# Patient Record
Sex: Female | Born: 1970 | Race: Black or African American | Hispanic: No | Marital: Married | Smoking: Former smoker
Health system: Southern US, Community
[De-identification: ages and names within clinical notes are randomized; demographics above are authoritative.]

## PROBLEM LIST (undated history)

## (undated) DIAGNOSIS — F419 Anxiety disorder, unspecified: Secondary | ICD-10-CM

## (undated) DIAGNOSIS — F32A Depression, unspecified: Secondary | ICD-10-CM

## (undated) DIAGNOSIS — Z803 Family history of malignant neoplasm of breast: Secondary | ICD-10-CM

## (undated) DIAGNOSIS — I639 Cerebral infarction, unspecified: Secondary | ICD-10-CM

## (undated) DIAGNOSIS — Z8042 Family history of malignant neoplasm of prostate: Secondary | ICD-10-CM

## (undated) DIAGNOSIS — Z973 Presence of spectacles and contact lenses: Secondary | ICD-10-CM

## (undated) DIAGNOSIS — Z9109 Other allergy status, other than to drugs and biological substances: Secondary | ICD-10-CM

## (undated) DIAGNOSIS — K649 Unspecified hemorrhoids: Secondary | ICD-10-CM

## (undated) DIAGNOSIS — Z800 Family history of malignant neoplasm of digestive organs: Secondary | ICD-10-CM

## (undated) DIAGNOSIS — T4145XA Adverse effect of unspecified anesthetic, initial encounter: Secondary | ICD-10-CM

## (undated) DIAGNOSIS — T8859XA Other complications of anesthesia, initial encounter: Secondary | ICD-10-CM

## (undated) DIAGNOSIS — M5430 Sciatica, unspecified side: Secondary | ICD-10-CM

## (undated) DIAGNOSIS — F329 Major depressive disorder, single episode, unspecified: Secondary | ICD-10-CM

## (undated) DIAGNOSIS — I38 Endocarditis, valve unspecified: Secondary | ICD-10-CM

## (undated) DIAGNOSIS — D649 Anemia, unspecified: Secondary | ICD-10-CM

## (undated) HISTORY — DX: Depression, unspecified: F32.A

## (undated) HISTORY — DX: Unspecified hemorrhoids: K64.9

## (undated) HISTORY — DX: Sciatica, unspecified side: M54.30

## (undated) HISTORY — DX: Anemia, unspecified: D64.9

## (undated) HISTORY — DX: Cerebral infarction, unspecified: I63.9

## (undated) HISTORY — DX: Family history of malignant neoplasm of prostate: Z80.42

## (undated) HISTORY — DX: Family history of malignant neoplasm of breast: Z80.3

## (undated) HISTORY — DX: Family history of malignant neoplasm of digestive organs: Z80.0

## (undated) HISTORY — DX: Major depressive disorder, single episode, unspecified: F32.9

## (undated) HISTORY — DX: Other allergy status, other than to drugs and biological substances: Z91.09

## (undated) HISTORY — DX: Anxiety disorder, unspecified: F41.9

---
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
## 2007-08-01 ENCOUNTER — Ambulatory Visit: Payer: Self-pay | Admitting: Obstetrics and Gynecology

## 2008-06-21 ENCOUNTER — Ambulatory Visit: Payer: Self-pay | Admitting: Internal Medicine

## 2009-07-17 ENCOUNTER — Ambulatory Visit: Payer: Self-pay | Admitting: Internal Medicine

## 2009-12-02 ENCOUNTER — Emergency Department: Payer: Self-pay | Admitting: Emergency Medicine

## 2010-11-13 ENCOUNTER — Ambulatory Visit: Payer: Self-pay | Admitting: Internal Medicine

## 2011-02-11 ENCOUNTER — Encounter: Payer: Self-pay | Admitting: Maternal and Fetal Medicine

## 2011-07-25 ENCOUNTER — Inpatient Hospital Stay: Payer: Self-pay

## 2011-07-25 LAB — CBC WITH DIFFERENTIAL/PLATELET
Basophil #: 0 10*3/uL (ref 0.0–0.1)
Basophil %: 0.2 %
Eosinophil #: 0.1 10*3/uL (ref 0.0–0.7)
Eosinophil %: 0.8 %
HCT: 31.5 % — ABNORMAL LOW (ref 35.0–47.0)
HGB: 10.5 g/dL — ABNORMAL LOW (ref 12.0–16.0)
Lymphocyte #: 1.3 10*3/uL (ref 1.0–3.6)
Lymphocyte %: 13 %
MCH: 29.5 pg (ref 26.0–34.0)
MCHC: 33.3 g/dL (ref 32.0–36.0)
MCV: 89 fL (ref 80–100)
Monocyte #: 0.9 x10 3/mm (ref 0.2–0.9)
Monocyte %: 8.5 %
Neutrophil #: 7.8 10*3/uL — ABNORMAL HIGH (ref 1.4–6.5)
Neutrophil %: 77.5 %
Platelet: 185 10*3/uL (ref 150–440)
RBC: 3.55 10*6/uL — ABNORMAL LOW (ref 3.80–5.20)
RDW: 13.2 % (ref 11.5–14.5)
WBC: 10.1 10*3/uL (ref 3.6–11.0)

## 2011-07-26 LAB — HEMATOCRIT: HCT: 31.2 % — ABNORMAL LOW (ref 35.0–47.0)

## 2012-09-21 ENCOUNTER — Observation Stay: Payer: Self-pay | Admitting: Obstetrics & Gynecology

## 2012-10-02 ENCOUNTER — Observation Stay: Payer: Self-pay

## 2012-10-07 ENCOUNTER — Observation Stay: Payer: Self-pay | Admitting: Obstetrics & Gynecology

## 2012-10-07 LAB — BASIC METABOLIC PANEL
Anion Gap: 9 (ref 7–16)
BUN: 8 mg/dL (ref 7–18)
Calcium, Total: 9.2 mg/dL (ref 8.5–10.1)
Chloride: 108 mmol/L — ABNORMAL HIGH (ref 98–107)
Co2: 21 mmol/L (ref 21–32)
Creatinine: 0.54 mg/dL — ABNORMAL LOW (ref 0.60–1.30)
EGFR (African American): >60
EGFR (Non-African Amer.): >60
Glucose: 98 mg/dL (ref 65–99)
Osmolality: 274 (ref 275–301)
Potassium: 3.6 mmol/L (ref 3.5–5.1)
Sodium: 138 mmol/L (ref 136–145)

## 2012-10-07 LAB — CREATININE, URINE, RANDOM: Creatinine, Urine Random: 114 mg/dL (ref 30.0–125.0)

## 2012-10-07 LAB — SGOT (AST)(ARMC): SGOT(AST): 21 U/L (ref 15–37)

## 2012-10-07 LAB — URIC ACID: Uric Acid: 3.1 mg/dL (ref 2.6–6.0)

## 2012-10-07 LAB — HEMATOCRIT: HCT: 28.3 % — ABNORMAL LOW (ref 35.0–47.0)

## 2012-10-07 LAB — PROTEIN, URINE, RANDOM: Protein, Random Urine: 20 mg/dL — ABNORMAL HIGH (ref 0–12)

## 2012-10-07 LAB — PLATELET COUNT: Platelet: 175 10*3/uL (ref 150–440)

## 2012-10-07 LAB — WBC: WBC: 8.2 10*3/uL (ref 3.6–11.0)

## 2012-10-07 LAB — HEMOGLOBIN: HGB: 9.3 g/dL — ABNORMAL LOW (ref 12.0–16.0)

## 2012-10-07 LAB — RBC: RBC: 3.42 10*6/uL — ABNORMAL LOW (ref 3.80–5.20)

## 2012-10-08 ENCOUNTER — Inpatient Hospital Stay: Payer: Self-pay

## 2012-10-08 LAB — CBC WITH DIFFERENTIAL/PLATELET
Basophil #: 0 10*3/uL (ref 0.0–0.1)
Basophil %: 0.2 %
Eosinophil #: 0 10*3/uL (ref 0.0–0.7)
Eosinophil %: 0.4 %
HCT: 32 % — ABNORMAL LOW (ref 35.0–47.0)
HGB: 10.4 g/dL — ABNORMAL LOW (ref 12.0–16.0)
Lymphocyte #: 1.3 10*3/uL (ref 1.0–3.6)
Lymphocyte %: 11.7 %
MCH: 26.9 pg (ref 26.0–34.0)
MCHC: 32.6 g/dL (ref 32.0–36.0)
MCV: 83 fL (ref 80–100)
Monocyte #: 0.6 x10 3/mm (ref 0.2–0.9)
Monocyte %: 5.2 %
Neutrophil #: 9.4 10*3/uL — ABNORMAL HIGH (ref 1.4–6.5)
Neutrophil %: 82.5 %
Platelet: 195 10*3/uL (ref 150–440)
RBC: 3.87 10*6/uL (ref 3.80–5.20)
RDW: 15.5 % — ABNORMAL HIGH (ref 11.5–14.5)
WBC: 11.4 10*3/uL — ABNORMAL HIGH (ref 3.6–11.0)

## 2012-10-09 LAB — HEMATOCRIT: HCT: 26.7 % — ABNORMAL LOW (ref 35.0–47.0)

## 2013-12-11 LAB — HM PAP SMEAR: HM Pap smear: NORMAL

## 2014-05-27 ENCOUNTER — Emergency Department: Payer: 59

## 2014-05-27 ENCOUNTER — Encounter: Payer: Self-pay | Admitting: Emergency Medicine

## 2014-05-27 ENCOUNTER — Emergency Department
Admission: EM | Admit: 2014-05-27 | Discharge: 2014-05-27 | Disposition: A | Discharge status: Home / Self Care | Payer: 59 | Attending: Emergency Medicine | Admitting: Emergency Medicine

## 2014-05-27 ENCOUNTER — Other Ambulatory Visit: Payer: Self-pay

## 2014-05-27 DIAGNOSIS — Z880 Allergy status to penicillin: Secondary | ICD-10-CM | POA: Insufficient documentation

## 2014-05-27 DIAGNOSIS — R079 Chest pain, unspecified: Secondary | ICD-10-CM | POA: Diagnosis present

## 2014-05-27 DIAGNOSIS — Z91040 Latex allergy status: Secondary | ICD-10-CM | POA: Diagnosis not present

## 2014-05-27 DIAGNOSIS — R0789 Other chest pain: Secondary | ICD-10-CM | POA: Insufficient documentation

## 2014-05-27 LAB — BASIC METABOLIC PANEL
Anion gap: 5 (ref 5–15)
BUN: 15 mg/dL (ref 6–20)
CO2: 26 mmol/L (ref 22–32)
Calcium: 9.3 mg/dL (ref 8.9–10.3)
Chloride: 112 mmol/L — ABNORMAL HIGH (ref 101–111)
Creatinine, Ser: 0.7 mg/dL (ref 0.44–1.00)
GFR calc Af Amer: >60 mL/min (ref >60)
GFR calc non Af Amer: >60 mL/min (ref >60)
Glucose, Bld: 97 mg/dL (ref 65–99)
Potassium: 3.7 mmol/L (ref 3.5–5.1)
Sodium: 143 mmol/L (ref 135–145)

## 2014-05-27 LAB — CBC
HCT: 38.1 % (ref 35.0–47.0)
Hemoglobin: 12 g/dL (ref 12.0–16.0)
MCH: 27.7 pg (ref 26.0–34.0)
MCHC: 31.5 g/dL — ABNORMAL LOW (ref 32.0–36.0)
MCV: 87.8 fL (ref 80.0–100.0)
Platelets: 218 10*3/uL (ref 150–440)
RBC: 4.34 MIL/uL (ref 3.80–5.20)
RDW: 14.1 % (ref 11.5–14.5)
WBC: 4.1 10*3/uL (ref 3.6–11.0)

## 2014-05-27 LAB — TROPONIN I: Troponin I: <0.03 ng/mL (ref <0.031)

## 2014-05-27 MED ORDER — DIAZEPAM 5 MG PO TABS: 5.0000 mg | ORAL_TABLET | Freq: Three times a day (TID) | ORAL | Status: DC | PRN

## 2014-05-27 NOTE — ED Notes (Signed)
Pt reports chest pain for the past three days with tightness on and off. Pt denies any chest pain at present time.

## 2014-05-27 NOTE — ED Notes (Signed)
Pt in no distress upon assessment, pt speaking in full sentences with no labored breathing Brittney Duran, Brittney Alarlivia S, RN

## 2014-05-27 NOTE — Discharge Instructions (Signed)
Chest Pain (Nonspecific) °It is often hard to give a diagnosis for the cause of chest pain. There is always a chance that your pain could be related to something serious, such as a heart attack or a blood clot in the lungs. You need to follow up with your doctor. °HOME CARE °· If antibiotic medicine was given, take it as directed by your doctor. Finish the medicine even if you start to feel better. °· For the next few days, avoid activities that bring on chest pain. Continue physical activities as told by your doctor. °· Do not use any tobacco products. This includes cigarettes, chewing tobacco, and e-cigarettes. °· Avoid drinking alcohol. °· Only take medicine as told by your doctor. °· Follow your doctor's suggestions for more testing if your chest pain does not go away. °· Keep all doctor visits you made. °GET HELP IF: °· Your chest pain does not go away, even after treatment. °· You have a rash with blisters on your chest. °· You have a fever. °GET HELP RIGHT AWAY IF:  °· You have more pain or pain that spreads to your arm, neck, jaw, back, or belly (abdomen). °· You have shortness of breath. °· You cough more than usual or cough up blood. °· You have very bad back or belly pain. °· You feel sick to your stomach (nauseous) or throw up (vomit). °· You have very bad weakness. °· You pass out (faint). °· You have chills. °This is an emergency. Do not wait to see if the problems will go away. Call your local emergency services (911 in [place] Do not drive yourself to the hospital. °MAKE SURE YOU:  °· Understand these instructions. °· Will watch your condition. °· Will get help right away if you are not doing well or get worse. °Document Released: 06/23/2007 Document Revised: 01/09/2013 Document Reviewed: 06/23/2007 °ExitCare® Patient Information ©2015 [employer]. This information is not intended to replace advice given to you by your health care provider. Make sure you discuss any questions you have with your  health care provider. ° °Panic Attacks °Panic attacks are sudden, short-lived surges of severe anxiety, fear, or discomfort. They may occur for no reason when you are relaxed, when you are anxious, or when you are sleeping. Panic attacks may occur for a number of reasons:  °· Healthy people occasionally have panic attacks in extreme, life-threatening situations, such as war or natural disasters. Normal anxiety is a protective mechanism of the body that helps us react to danger (fight or flight response). °· Panic attacks are often seen with anxiety disorders, such as panic disorder, social anxiety disorder, generalized anxiety disorder, and phobias. Anxiety disorders cause excessive or uncontrollable anxiety. They may interfere with your relationships or other life activities. °· Panic attacks are sometimes seen with other mental illnesses, such as depression and posttraumatic stress disorder. °· Certain medical conditions, prescription medicines, and drugs of abuse can cause panic attacks. °SYMPTOMS  °Panic attacks start suddenly, peak within 20 minutes, and are accompanied by four or more of the following symptoms: °· Pounding heart or fast heart rate (palpitations). °· Sweating. °· Trembling or shaking. °· Shortness of breath or feeling smothered. °· Feeling choked. °· Chest pain or discomfort. °· Nausea or strange feeling in your stomach. °· Dizziness, light-headedness, or feeling like you will faint. °· Chills or hot flushes. °· Numbness or tingling in your lips or hands and feet. °· Feeling that things are not real or feeling that you are not yourself. °·   Fear of losing control or going crazy.  Fear of dying. Some of these symptoms can mimic serious medical conditions. For example, you may think you are having a heart attack. Although panic attacks can be very scary, they are not life threatening. DIAGNOSIS  Panic attacks are diagnosed through an assessment by your health care provider. Your health care  provider will ask questions about your symptoms, such as where and when they occurred. Your health care provider will also ask about your medical history and use of alcohol and drugs, including prescription medicines. Your health care provider may order blood tests or other studies to rule out a serious medical condition. Your health care provider may refer you to a mental health professional for further evaluation. TREATMENT   Most healthy people who have one or two panic attacks in an extreme, life-threatening situation will not require treatment.  The treatment for panic attacks associated with anxiety disorders or other mental illness typically involves counseling with a mental health professional, medicine, or a combination of both. Your health care provider will help determine what treatment is best for you.  Panic attacks due to physical illness usually go away with treatment of the illness. If prescription medicine is causing panic attacks, talk with your health care provider about stopping the medicine, decreasing the dose, or substituting another medicine.  Panic attacks due to alcohol or drug abuse go away with abstinence. Some adults need professional help in order to stop drinking or using drugs. HOME CARE INSTRUCTIONS   Take all medicines as directed by your health care provider.   Schedule and attend follow-up visits as directed by your health care provider. It is important to keep all your appointments. SEEK MEDICAL CARE IF:  You are not able to take your medicines as prescribed.  Your symptoms do not improve or get worse. SEEK IMMEDIATE MEDICAL CARE IF:   You experience panic attack symptoms that are different than your usual symptoms.  You have serious thoughts about hurting yourself or others.  You are taking medicine for panic attacks and have a serious side effect. MAKE SURE YOU:  Understand these instructions.  Will watch your condition.  Will get help right  away if you are not doing well or get worse. Document Released: 01/04/2005 Document Revised: 01/09/2013 Document Reviewed: 08/18/2012 [facility] Patient Information 2015 [place], [place]. This information is not intended to replace advice given to you by your health care provider. Make sure you discuss any questions you have with your health care provider.

## 2014-05-27 NOTE — ED Provider Notes (Signed)
[facility] Emergency Department Provider Note    Time seen: 9:37 AM  I have reviewed the triage vital signs and the nursing notes.   HISTORY  Chief Complaint Chest Pain    HPI Nehemiah MassedKiawana D Sigel is a 44 y.o. female who presents ER for tightness in her chest has been intermittent since Friday. Currently is mild nothing she has found makes it better or worse no other associated symptoms. She denies any recent illness. Denies history of same. Location pain is midsternal.    History reviewed. No pertinent past medical history.  There are no active problems to display for this patient.   Past Surgical History  Procedure Laterality Date  . Abdominal surgery      No current outpatient prescriptions on file.  Allergies Penicillins and Latex  No family history on file.  Social History History  Substance Use Topics  . Smoking status: Never Smoker   . Smokeless tobacco: Not on file  . Alcohol Use: Yes    Review of Systems Constitutional: Negative for fever. Eyes: Negative for visual changes. ENT: Negative for sore throat. Cardiovascular: Positive for chest pain. Respiratory: Negative for shortness of breath. Gastrointestinal: Negative for abdominal pain, vomiting and diarrhea. Genitourinary: Negative for dysuria. Musculoskeletal: Negative for back pain. Skin: Negative for rash. Neurological: Negative for headaches, focal weakness or numbness.  10-point ROS otherwise negative.  ____________________________________________   PHYSICAL EXAM:  VITAL SIGNS: ED Triage Vitals  Enc Vitals Group     BP 05/27/14 0921 115/58 mmHg     Pulse --      Resp 05/27/14 0921 20     Temp 05/27/14 0921 97.5 F (36.4 C)     Temp Source 05/27/14 0921 Oral     SpO2 05/27/14 0921 100 %     Weight 05/27/14 0921 200 lb (90.719 kg)     Height 05/27/14 0921 5\' 6"  (1.676 m)     Head Cir --      Peak Flow --      Pain Score 05/27/14 0922 0     Pain Loc --    Pain Edu? --      Excl. in GC? --     Constitutional: Alert and oriented. Well appearing and in no distress. Eyes: Conjunctivae are normal. PERRL. Normal extraocular movements. ENT   Head: Normocephalic and atraumatic.   Nose: No congestion/rhinnorhea.   Mouth/Throat: Mucous membranes are moist.   Neck: No stridor. Hematological/Lymphatic/Immunilogical: No cervical lymphadenopathy. Cardiovascular: Normal rate, regular rhythm. Normal and symmetric distal pulses are present in all extremities. No murmurs, rubs, or gallops. Respiratory: Normal respiratory effort without tachypnea nor retractions. Breath sounds are clear and equal bilaterally. No wheezes/rales/rhonchi. Gastrointestinal: Soft and nontender. No distention. No abdominal bruits. There is no CVA tenderness. Musculoskeletal: Nontender with normal range of motion in all extremities. No joint effusions.  No lower extremity tenderness nor edema. Neurologic:  Normal speech and language. No gross focal neurologic deficits are appreciated. Speech is normal. No gait instability. Skin:  Skin is warm, dry and intact. No rash noted. Psychiatric: Mood and affect are normal. Speech and behavior are normal. Patient exhibits appropriate insight and judgment.  ____________________________________________    LABS (pertinent positives/negatives)  Normal labs, negative troponin  EKG: Normal sinus rhythm rate 66 normal EKG ____________________________________________    RADIOLOGY  Normal chest x-ray  ____________________________________________    ED COURSE  Pertinent labs & imaging results that were available during my care of the patient were reviewed by me  and considered in my medical decision making (see chart for details).  Tight midsternal chest pain. Etiology unclear. Patient is low risk for ACS, will check basic labs EKG and reevaluate.  FINAL ASSESSMENT AND PLAN  Assessment: Chest pain  Plan: Pain is likely  anxiety related. DC with Valium when necessary. Close follow-up with her primary care doctors recommended.    Emily FilbertWilliams, Jonathan E, MD   Emily FilbertJonathan E Williams, MD 05/27/14 [phone]

## 2014-05-27 NOTE — ED Notes (Signed)
Pt states she has had chest pain on and off for 1 week, pt states she called her PCP and was told to come to the ER, pt denies any cardiac hx Manuel Lawhead, Maryann Alarlivia S, RN

## 2014-05-28 NOTE — H&P (Signed)
L&D Evaluation:  History Expanded:  HPI 44 yo G6 P4014 with EDC=10/09/2012 by an 11 week ultrasound presents at 39weeks 6 days with mildly elevated BPs. Denies h/a, blurry vision, epig pain, CP, SOB, or edema.  Baby active. Has had more painful and regular contractions. No VB. PNC has been remarkable for a prior C-section in 1989 for FTP and severe preeclampsia with three subsequent VBACs.  PNC also remarkable for AMA (Harmony test negative), the fetus having a SUA and echogenic focus, anemia, and GBS positive. Has been having weekly NSTs and AFIs since 32 weeks and an ultrasound at 33 weeks revealing normal growth with EFW in the 31.7th%. LABS:O POS, RI, VI   Gravida 6   Term 4   PreTerm 0   Abortion 1   Living 4   Blood Type (Maternal) O positive   Group B Strep Results Maternal (Result >5wks must be treated as unknown) positive   Maternal HIV Negative   Maternal Syphilis Ab Nonreactive   Maternal Varicella Immune   Rubella Results (Maternal) immune   Maternal T-Dap Unknown   [facility]   Presents with contractions   Patient's Medical History No Chronic Illness  Hypertension  obesity   Patient's Surgical History Previous C-Section  hernia repair   Medications Pre Natal Vitamins   Allergies PCN, latex   Social History none   Family History Non-Contributory   ROS:  ROS see HPI   Exam:  Vital Signs Initial- 130-150/80s, now- 120-130/70-80s.   Urine Protein trace   General no apparent distress   Mental Status clear   Chest clear   Heart normal sinus rhythm   Abdomen gravid, non-tender   Estimated Fetal Weight Average for gestational age   Fetal Position cephalic   Back no CVAT   Edema no edema   Pelvic no external lesions, 6cm   Mebranes Intact   FHT normal rate with no decels, 140 with accels   FHT Description great variability   Fetal Heart Rate 145   Ucx regular, q 2-4 min   Skin dry   Lymph no lymphadenopathy    Impression:  Impression active labor, IUP at 39 weeks   Plan:  Plan EFM/NST, antibiotics for GBBS prophylaxis   Comments anticipate SVD,  With rest BPs improved, and labs normal, no other symptoms. F/U in office or Labor and Delivery sooner if change in symptoms or certainly in labor.   Follow Up Appointment need to schedule. in 6 weeks   Electronic Signatures: Adria DevonKlett, Leiam Hopwood (MD)  (Signed 21-Sep-14 11:28)  Authored: L&D Evaluation   Last Updated: 21-Sep-14 11:28 by Adria DevonKlett, Otis Portal (MD)

## 2014-05-28 NOTE — H&P (Signed)
L&D Evaluation:  History Expanded:   HPI 44 yo at 40weeks 6 days, Pt is AMA and a multip. last baby was 10 years ago. she had csection for 10 baby and two VBACs after. desires vbac and no epidural. admit for delivery at 4 cm. with bloody show, pt needs tDap , she is RI, VI, GBS negand had elevated rusk for DS on AFP test.    Gravida 5    Term 3    PreTerm 0    Abortion 1    Living 3    Blood Type O positive    Group B Strep Results (Result >5wks must be treated as unknown) negative    Maternal HIV Negative    Maternal Syphilis Ab Nonreactive    Maternal Varicella Immune    Rubella Results immune    Maternal T-Dap Nonimmune    [facility]    Presents with contractions    Patient's Medical History Hypertension  obesity    Patient's Surgical History Previous C-Section    Medications Pre Natal Vitamins    Allergies PCN, latex    Social History none    Family History Non-Contributory   ROS:   ROS All systems were reviewed.  HEENT, CNS, GI, GU, Respiratory, CV, Renal and Musculoskeletal systems were found to be normal.   Exam:   Vital Signs stable    Urine Protein not completed    General no apparent distress    Mental Status clear    Chest clear    Heart normal sinus rhythm    Abdomen gravid, tender with contractions    Fetal Position vertex    Pelvic no external lesions, 4    Mebranes Intact    FHT normal rate with no decels    FHT Description strictly reactive no decels,    Fetal Heart Rate 140    Ucx irregular    Skin dry   Impression:   Impression active labor   Plan:   Plan monitor contractions and for cervical change    Comments admit for delivery VBAC concent signed and she is a great candidate for Vbac    Follow Up Appointment in 6 weeks   Electronic Signatures: Adria DevonKlett, Evany Schecter (MD)  (Signed 07-Jul-13 09:31)  Authored: L&D Evaluation   Last Updated: 07-Jul-13 09:31 by Adria DevonKlett, Laynee Lockamy (MD)

## 2014-05-28 NOTE — H&P (Signed)
L&D Evaluation:  History Expanded:  HPI 44 yo G6 P4014 with EDC=10/09/2012 by an 11 week ultrasound presents at 39+ weeks with elevated BPs. Denies h/a, blurry vision, epig pain, CP, SOB, or edema.  Baby active. Has had more painful, although not regular contractions. No VB. PNC has been remarkable for a prior C-section in 1989 for FTP and severe preeclampsia with three subsequent VBACs.  PNC also remarkable for AMA (Harmony test negative), the fetus having a SUA and echogenic focus, anemia, and GBS positive. Has been having weekly NSTs and AFIs since 32 weeks and an ultrasound at 33 weeks revealing normal growth with EFW in the 31.7th%. LABS:O POS, RI, VI   Patient's Medical History Hypertension  obesity   Patient's Surgical History Previous C-Section  hernia repair   Medications Pre Natal Vitamins   Allergies PCN, latex   Social History none   Family History Non-Contributory   ROS:  ROS see HPI   Exam:  Vital Signs Initial- 130-150/80s, now- 120-130/70-80s.   Urine Protein trace   General no apparent distress   Mental Status clear   Abdomen gravid, non-tender   Estimated Fetal Weight Average for gestational age   Fetal Position cephalic   Back no CVAT   Edema no edema   FHT normal rate with no decels, 140 with accels   FHT Description mod variability   Fetal Heart Rate 145   Ucx irregular, q15+ min apart   Skin dry   Other See labs   Impression:  Impression IUP at 39 weeks no signs or symptoms of preclampia; gestation HTN   Plan:  Plan EFM/NST, PIH panel   Comments With rest BPs improved, and labs normal, no other symptoms. F/U in office or Labor and Delivery sooner if change in symptoms or certainly in labor.   Electronic Signatures: Letitia LibraHarris, Robert Paul (MD)  (Signed 20-Sep-14 07:00)  Authored: L&D Evaluation   Last Updated: 20-Sep-14 07:00 by Letitia LibraHarris, Robert Paul (MD)

## 2014-05-28 NOTE — H&P (Signed)
L&D Evaluation:  History Expanded:  HPI 44 yo at term, pregnancy w SUA, for monitoring. AFI recently 12.  Non-Stress Test here.   Gravida 6   Term 4   PreTerm 0   Abortion 1   Living 4   Blood Type (Maternal) O positive   Maternal HIV Negative   Maternal Syphilis Ab Nonreactive   Maternal Varicella Immune   Rubella Results (Maternal) immune   Maternal T-Dap Nonimmune   [facility]   Patient's Medical History Hypertension  obesity   Patient's Surgical History Previous C-Section   Medications Pre Natal Vitamins   Allergies PCN, latex   Social History none   Family History Non-Contributory   ROS:  ROS All systems were reviewed.  HEENT, CNS, GI, GU, Respiratory, CV, Renal and Musculoskeletal systems were found to be normal.   Exam:  Vital Signs stable   Urine Protein not completed   General no apparent distress   Mental Status clear   Abdomen gravid, non-tender   Estimated Fetal Weight Average for gestational age   Fetal Position vertex   Edema no edema   FHT normal rate with no decels, REACTIVE   Fetal Heart Rate 140   Impression:  Impression reactive NST   Plan:  Plan EFM/NST   Follow Up Appointment already scheduled. in 6 weeks   Electronic Signatures: Letitia LibraHarris, Nizar Cutler Paul (MD)  (Signed 04-Sep-14 17:59)  Authored: L&D Evaluation   Last Updated: 04-Sep-14 17:59 by Letitia LibraHarris, Terrie Haring Paul (MD)

## 2014-05-28 NOTE — H&P (Signed)
L&D Evaluation:  History:  HPI 44 yo G6 P4014 with EDC=10/09/2012 by an 11 week ultrasound presented at 39 weeks with c/o leakage of fluid at 1145 this AM after standing up from urinating. The fluid filled a miniliner, and ran into her shoe.. She has not leaked any fluid since. The fluid did smell and look like urine but she did not feel the fluid come from her urethra and could not stip it. Baby active. Has had more painful, although not regular contractions. No VB. PNC has been remarkable for a prior C-section in 1989 for FTP and severe preeclampsia with three subsequent VBACs.  PNC also remarkable for AMA (Harmony test negative), the fetus having a SUA and echogenic focus, anemia, and GBS positive. Has been having weekly NSTs and AFIs since 32 weeks and an ultrasound at 33 weeks revealing normal growth with EFW in the 31.7th%. LABS:O POS, RI, VI   Presents with leaking fluid   Patient's Medical History Hypertension  obesity   Patient's Surgical History Previous C-Section  hernia repair   Medications Pre Natal Vitamins   Allergies PCN, latex   Social History none   Family History Non-Contributory   ROS:  ROS see HPI   Exam:  Vital Signs 137/75   Urine Protein not completed   General no apparent distress   Mental Status clear   Abdomen gravid, non-tender   Estimated Fetal Weight Average for gestational age   Fetal Position cephalic   Edema no edema   Pelvic no external lesions, SSE: mucoid white discharge. wet prep negative. Nitrazine neg, ferning negative.   Mebranes Intact   FHT normal rate with no decels, 145 with accels to 160s to 170s   FHT Description mod variability   Fetal Heart Rate 145   Ucx irregular, q15+ min apart   Skin dry   Impression:  Impression IUP at 39 weeks with no evidence of SROM. Reactive NST   Plan:  Plan DC home with labor precautions. RTO Monday as scheduled or sooner prn.   Electronic Signatures: Trinna BalloonGutierrez, Dontell Mian L (CNM)   (Signed 15-Sep-14 14:42)  Authored: L&D Evaluation   Last Updated: 15-Sep-14 14:42 by Trinna BalloonGutierrez, Tyreke Kaeser L (CNM)

## 2014-10-01 ENCOUNTER — Other Ambulatory Visit: Payer: Self-pay

## 2014-10-01 DIAGNOSIS — F419 Anxiety disorder, unspecified: Secondary | ICD-10-CM

## 2014-10-01 DIAGNOSIS — E559 Vitamin D deficiency, unspecified: Secondary | ICD-10-CM | POA: Insufficient documentation

## 2014-10-02 ENCOUNTER — Ambulatory Visit (INDEPENDENT_AMBULATORY_CARE_PROVIDER_SITE_OTHER): Payer: 59 | Admitting: Gastroenterology

## 2014-10-02 ENCOUNTER — Encounter (INDEPENDENT_AMBULATORY_CARE_PROVIDER_SITE_OTHER): Payer: Self-pay

## 2014-10-02 ENCOUNTER — Encounter: Payer: Self-pay | Admitting: Gastroenterology

## 2014-10-02 ENCOUNTER — Other Ambulatory Visit: Payer: Self-pay

## 2014-10-02 VITALS — BP 119/61 | HR 79 | Temp 98.4°F | Ht 67.0 in | Wt 205.0 lb

## 2014-10-02 DIAGNOSIS — R194 Change in bowel habit: Secondary | ICD-10-CM | POA: Diagnosis not present

## 2014-10-02 DIAGNOSIS — K921 Melena: Secondary | ICD-10-CM

## 2014-10-02 NOTE — Progress Notes (Signed)
Gastroenterology Consultation  Referring Provider:     Lyndon Code, MD Primary Care Physician:  Lyndon Code, MD Primary Gastroenterologist:  Dr. Servando Snare     Reason for Consultation:     Hematochezia and change in bowel habits        HPI:   Brittney Duran is a 44 y.o. y/o female referred for consultation & management of the hematochezia and change in bowel habits by Dr. Welton Flakes, Shannan Harper, MD.  This patient comes here today after having an episode last month of rectal bleeding. The patient states that there was dark blood and also red blood that lasted approximate 4 days. She also reports that she has been having a change in bowel habits with more constipation recently that she has the past. This has continued despite the rectal bleeding stopping. There is no report of any unexplained weight loss. The patient also denies any nausea vomiting fevers or chills. The patient denies any family history of colon cancer colon polyps. The patient now being sent to me for evaluation of rectal bleeding with change in bowel habits.  Past Medical History  Diagnosis Date  . Hemorrhoids   . Anxiety   . Anemia   . Environmental allergies     Past Surgical History  Procedure Laterality Date  . Abdominal surgery    . Caesaran section  1989    Prior to Admission medications   Medication Sig Start Date End Date Taking? Authorizing Provider  ALPRAZolam (XANAX) 0.25 MG tablet Take 0.25 mg by mouth 2 (two) times daily as needed for anxiety.   Yes Historical Provider, MD  cetirizine-pseudoephedrine (ZYRTEC-D) 5-120 MG per tablet Take 1 tablet by mouth 2 (two) times daily as needed for allergies.   Yes Historical Provider, MD  PARAGARD INTRAUTERINE COPPER IUD IUD 1 each by Intrauterine route once.   Yes Historical Provider, MD  vitamin B-12 (CYANOCOBALAMIN) 1000 MCG tablet Take 1,000 mcg by mouth daily as needed (energy).   Yes Historical Provider, MD  Vitamin D, Ergocalciferol, (DRISDOL) 50000 UNITS CAPS  capsule Take 50,000 Units by mouth every 7 (seven) days.   Yes Historical Provider, MD  diazepam (VALIUM) 5 MG tablet Take 1 tablet (5 mg total) by mouth every 8 (eight) hours as needed for anxiety. Patient not taking: Reported on 10/02/2014 05/27/14 05/27/15  Emily Filbert, MD    Family History  Problem Relation Age of Onset  . Hypertension Mother      Social History  Substance Use Topics  . Smoking status: Never Smoker   . Smokeless tobacco: Never Used  . Alcohol Use: Yes    Allergies as of 10/02/2014 - Review Complete 10/02/2014  Allergen Reaction Noted  . Penicillins Other (See Comments) 05/27/2014  . Latex Rash 05/27/2014    Review of Systems:    All systems reviewed and negative except where noted in HPI.   Physical Exam:  BP 119/61 mmHg  Pulse 79  Temp(Src) 98.4 F (36.9 C) (Oral)  Ht  (1.702 m)  Wt 205 lb (92.987 kg)  BMI 32.10 kg/m2 No LMP recorded. Psych:  Alert and cooperative. Normal mood and affect. General:   Alert,  Well-developed, well-nourished, pleasant and cooperative in NAD Head:  Normocephalic and atraumatic. Eyes:  Sclera clear, no icterus.   Conjunctiva pink. Ears:  Normal auditory acuity. Nose:  No deformity, discharge, or lesions. Mouth:  No deformity or lesions,oropharynx pink & moist. Neck:  Supple; no masses or thyromegaly. Lungs:  Respirations even and unlabored.  Clear throughout to auscultation.   No wheezes, crackles, or rhonchi. No acute distress. Heart:  Regular rate and rhythm; no murmurs, clicks, rubs, or gallops. Abdomen:  Normal bowel sounds.  No bruits.  Soft, non-tender and non-distended without masses, hepatosplenomegaly or hernias noted.  No guarding or rebound tenderness.  Negative Carnett sign.   Rectal:  Deferred.  Msk:  Symmetrical without gross deformities.  Good, equal movement & strength bilaterally. Pulses:  Normal pulses noted. Extremities:  No clubbing or edema.  No cyanosis. Neurologic:  Alert and oriented  x3;  grossly normal neurologically. Skin:  Intact without significant lesions or rashes.  No jaundice. Lymph Nodes:  No significant cervical adenopathy. Psych:  Alert and cooperative. Normal mood and affect.  Imaging Studies: No results found.  Assessment and Plan:   Brittney Duran is a 44 y.o. y/o female who comes in today with a history of 4 days of rectal bleeding and a change in bowel habits with increasing constipation over the last few months. The patient's rectal bleeding only lasted 4 days and was proximal to 1 month ago. The patient has been told that she should be set up for colonoscopy. The patient has agreed to being set up for colonoscopy.I have discussed risks & benefits which include, but are not limited to, bleeding, infection, perforation & drug reaction.  The patient agrees with this plan & written consent will be obtained.      Note: This dictation was prepared with Dragon dictation along with smaller phrase technology. Any transcriptional errors that result from this process are unintentional.

## 2014-10-03 ENCOUNTER — Encounter: Payer: Self-pay | Admitting: *Deleted

## 2014-10-04 NOTE — Discharge Instructions (Signed)

## 2014-10-07 ENCOUNTER — Encounter
Admission: RE | Disposition: A | Discharge status: Home / Self Care | Payer: Self-pay | Source: Ambulatory Visit | Attending: Gastroenterology

## 2014-10-07 ENCOUNTER — Ambulatory Visit: Payer: Managed Care, Other (non HMO) | Admitting: Anesthesiology

## 2014-10-07 ENCOUNTER — Ambulatory Visit
Admission: RE | Admit: 2014-10-07 | Discharge: 2014-10-07 | Disposition: A | Discharge status: Home / Self Care | Payer: Managed Care, Other (non HMO) | Source: Ambulatory Visit | Attending: Gastroenterology | Admitting: Gastroenterology

## 2014-10-07 DIAGNOSIS — Z880 Allergy status to penicillin: Secondary | ICD-10-CM | POA: Insufficient documentation

## 2014-10-07 DIAGNOSIS — D649 Anemia, unspecified: Secondary | ICD-10-CM | POA: Insufficient documentation

## 2014-10-07 DIAGNOSIS — F419 Anxiety disorder, unspecified: Secondary | ICD-10-CM | POA: Insufficient documentation

## 2014-10-07 DIAGNOSIS — K921 Melena: Secondary | ICD-10-CM | POA: Insufficient documentation

## 2014-10-07 DIAGNOSIS — Z8249 Family history of ischemic heart disease and other diseases of the circulatory system: Secondary | ICD-10-CM | POA: Diagnosis not present

## 2014-10-07 DIAGNOSIS — K640 First degree hemorrhoids: Secondary | ICD-10-CM | POA: Insufficient documentation

## 2014-10-07 DIAGNOSIS — Z91040 Latex allergy status: Secondary | ICD-10-CM | POA: Insufficient documentation

## 2014-10-07 DIAGNOSIS — Z79899 Other long term (current) drug therapy: Secondary | ICD-10-CM | POA: Insufficient documentation

## 2014-10-07 HISTORY — DX: Presence of spectacles and contact lenses: Z97.3

## 2014-10-07 HISTORY — DX: Other complications of anesthesia, initial encounter: T88.59XA

## 2014-10-07 HISTORY — DX: Adverse effect of unspecified anesthetic, initial encounter: T41.45XA

## 2014-10-07 HISTORY — DX: Endocarditis, valve unspecified: I38

## 2014-10-07 HISTORY — PX: COLONOSCOPY WITH PROPOFOL: SHX5780

## 2014-10-07 SURGERY — COLONOSCOPY WITH PROPOFOL
Anesthesia: Monitor Anesthesia Care | Wound class: Contaminated

## 2014-10-07 MED ORDER — SODIUM CHLORIDE 0.9 % IV SOLN: INTRAVENOUS | Status: DC

## 2014-10-07 MED ORDER — PROPOFOL 10 MG/ML IV BOLUS
INTRAVENOUS | Status: DC | PRN
  Administered 2014-10-07: 30 mg via INTRAVENOUS
  Administered 2014-10-07 (×2): 80 mg via INTRAVENOUS
  Administered 2014-10-07: 40 mg via INTRAVENOUS

## 2014-10-07 MED ORDER — LIDOCAINE HCL (CARDIAC) 20 MG/ML IV SOLN
INTRAVENOUS | Status: DC | PRN
  Administered 2014-10-07: 30 mg via INTRAVENOUS

## 2014-10-07 MED ORDER — STERILE WATER FOR IRRIGATION IR SOLN
Status: DC | PRN
  Administered 2014-10-07: 11:00:00

## 2014-10-07 MED ORDER — LACTATED RINGERS IV SOLN
INTRAVENOUS | Status: DC
  Administered 2014-10-07: 11:00:00 via INTRAVENOUS

## 2014-10-07 SURGICAL SUPPLY — 28 items
CANISTER SUCT 1200ML W/VALVE (MISCELLANEOUS) ×2 IMPLANT
FCP ESCP3.2XJMB 240X2.8X (MISCELLANEOUS)
FORCEPS BIOP RAD 4 LRG CAP 4 (CUTTING FORCEPS) IMPLANT
FORCEPS BIOP RJ4 240 W/NDL (MISCELLANEOUS)
FORCEPS ESCP3.2XJMB 240X2.8X (MISCELLANEOUS) IMPLANT
GOWN CVR UNV OPN BCK APRN NK (MISCELLANEOUS) ×2 IMPLANT
GOWN ISOL THUMB LOOP REG UNIV (MISCELLANEOUS) ×4
HEMOCLIP INSTINCT (CLIP) IMPLANT
INJECTOR VARIJECT VIN23 (MISCELLANEOUS) IMPLANT
KIT CO2 TUBING (TUBING) IMPLANT
KIT DEFENDO VALVE AND CONN (KITS) IMPLANT
KIT ENDO PROCEDURE OLY (KITS) ×2 IMPLANT
LIGATOR MULTIBAND 6SHOOTER MBL (MISCELLANEOUS) IMPLANT
MARKER SPOT ENDO TATTOO 5ML (MISCELLANEOUS) IMPLANT
PAD GROUND ADULT SPLIT (MISCELLANEOUS) IMPLANT
SNARE SHORT THROW 13M SML OVAL (MISCELLANEOUS) IMPLANT
SNARE SHORT THROW 30M LRG OVAL (MISCELLANEOUS) IMPLANT
SPOT EX ENDOSCOPIC TATTOO (MISCELLANEOUS)
SUCTION POLY TRAP 4CHAMBER (MISCELLANEOUS) IMPLANT
TRAP SUCTION POLY (MISCELLANEOUS) IMPLANT
TUBING CONN 6MMX3.1M (TUBING)
TUBING SUCTION CONN 0.25 STRL (TUBING) IMPLANT
UNDERPAD 30X60 958B10 (PK) (MISCELLANEOUS) IMPLANT
VALVE BIOPSY ENDO (VALVE) IMPLANT
VARIJECT INJECTOR VIN23 (MISCELLANEOUS)
WATER AUXILLARY (MISCELLANEOUS) IMPLANT
WATER STERILE IRR 250ML POUR (IV SOLUTION) ×2 IMPLANT
WATER STERILE IRR 500ML POUR (IV SOLUTION) IMPLANT

## 2014-10-07 NOTE — H&P (Signed)
[facility]  [address] [place], [place] [address] Phone: [phone] Fax : [phone]  Primary Care Physician:  Lyndon Code, MD Primary Gastroenterologist:  Dr. Servando Snare  Pre-Procedure History & Physical: HPI:  Brittney Duran is a 44 y.o. female is here for an colonoscopy.   Past Medical History  Diagnosis Date  . Hemorrhoids   . Anemia   . Environmental allergies   . Complication of anesthesia     pt reports "local" wears off quickly  . Leaky heart valve   . Anxiety     Panic attacks  . Wears contact lenses     Past Surgical History  Procedure Laterality Date  . Abdominal surgery    . Caesaran section  1989    Prior to Admission medications   Medication Sig Start Date End Date Taking? Authorizing Provider  cetirizine-pseudoephedrine (ZYRTEC-D) 5-120 MG per tablet Take 1 tablet by mouth 2 (two) times daily as needed for allergies.   Yes Historical Provider, MD  diphenhydrAMINE (BENADRYL) 25 MG tablet Take 25 mg by mouth every 6 (six) hours as needed.   Yes Historical Provider, MD  vitamin B-12 (CYANOCOBALAMIN) 1000 MCG tablet Take 1,000 mcg by mouth daily as needed (energy).   Yes Historical Provider, MD  Vitamin D, Ergocalciferol, (DRISDOL) 50000 UNITS CAPS capsule Take 50,000 Units by mouth every 7 (seven) days.   Yes Historical Provider, MD  ALPRAZolam (XANAX) 0.25 MG tablet Take 0.25 mg by mouth 2 (two) times daily as needed for anxiety.    Historical Provider, MD  diazepam (VALIUM) 5 MG tablet Take 1 tablet (5 mg total) by mouth every 8 (eight) hours as needed for anxiety. Patient not taking: Reported on 10/02/2014 05/27/14 05/27/15  Emily Filbert, MD  [facility] INTRAUTERINE COPPER IUD IUD 1 each by Intrauterine route once.    Historical Provider, MD    Allergies as of 10/02/2014 - Review Complete 10/02/2014  Allergen Reaction Noted  . Penicillins Other (See Comments) 05/27/2014  . Latex Rash 05/27/2014    Family History  Problem  Relation Age of Onset  . Hypertension Mother     Social History   Social History  . Marital Status: Married    Spouse Name: N/A  . Number of Children: N/A  . Years of Education: N/A   Occupational History  . Not on file.   Social History Main Topics  . Smoking status: Never Smoker   . Smokeless tobacco: Never Used  . Alcohol Use: No  . Drug Use: No  . Sexual Activity: Not on file   Other Topics Concern  . Not on file   Social History Narrative    Review of Systems: See HPI, otherwise negative ROS  Physical Exam: BP 116/68 mmHg  Pulse 65  Temp(Src) 97.7 F (36.5 C)  Resp 16  Ht  (1.702 m)  Wt 197 lb (89.359 kg)  BMI 30.85 kg/m2  SpO2 100%  LMP 10/01/2014 (Exact Date) General:   Alert,  pleasant and cooperative in NAD Head:  Normocephalic and atraumatic. Neck:  Supple; no masses or thyromegaly. Lungs:  Clear throughout to auscultation.    Heart:  Regular rate and rhythm. Abdomen:  Soft, nontender and nondistended. Normal bowel sounds, without guarding, and without rebound.   Neurologic:  Alert and  oriented x4;  grossly normal neurologically.  Impression/Plan: [name] is here for an colonoscopy to be performed for hematocezia  Risks, benefits, limitations, and alternatives regarding  colonoscopy have been  reviewed with the patient.  Questions have been answered.  All parties agreeable.   Darlina Rumpf, MD  10/07/2014, 10:43 AM

## 2014-10-07 NOTE — Anesthesia Preprocedure Evaluation (Signed)
Anesthesia Evaluation  Patient identified by MRN, date of birth, ID band  Reviewed: Allergy & Precautions, H&P , NPO status , Patient's Chart, lab work & pertinent test results  History of Anesthesia Complications Negative for: history of anesthetic complications  Airway Mallampati: I  TM Distance: >3 FB Neck ROM: full    Dental no notable dental hx.    Pulmonary    Pulmonary exam normal        Cardiovascular  Rhythm:regular Rate:Normal     Neuro/Psych    GI/Hepatic   Endo/Other    Renal/GU      Musculoskeletal   Abdominal   Peds  Hematology   Anesthesia Other Findings   Reproductive/Obstetrics                             Anesthesia Physical Anesthesia Plan  ASA: II  Anesthesia Plan: MAC   Post-op Pain Management:    Induction:   Airway Management Planned:   Additional Equipment:   Intra-op Plan:   Post-operative Plan:   Informed Consent: I have reviewed the patients History and Physical, chart, labs and discussed the procedure including the risks, benefits and alternatives for the proposed anesthesia with the patient or authorized representative who has indicated his/her understanding and acceptance.     Plan Discussed with: CRNA  Anesthesia Plan Comments:         Anesthesia Quick Evaluation

## 2014-10-07 NOTE — Anesthesia Postprocedure Evaluation (Signed)
  Anesthesia Post-op Note  Patient: Brittney Duran  Procedure(s) Performed: Procedure(s) with comments: COLONOSCOPY WITH PROPOFOL (N/A) - Latex  Anesthesia type:MAC  Patient location: PACU  Post pain: Pain level controlled  Post assessment: Post-op Vital signs reviewed, Patient's Cardiovascular Status Stable, Respiratory Function Stable, Patent Airway and No signs of Nausea or vomiting  Post vital signs: Reviewed and stable  Last Vitals:  Filed Vitals:   10/07/14 1121  BP: 113/68  Pulse:   Temp:   Resp:     Level of consciousness: awake, alert  and patient cooperative  Complications: No apparent anesthesia complications

## 2014-10-07 NOTE — Op Note (Signed)
[facility] Patient Name: Brittney Duran Procedure Date: 10/07/2014 10:44 AM MRN: [id number] Account #: [id number] Date of Birth: July 15, 1970 Admit Type: Outpatient Age: 44 Room: [facility] OR ROOM 01 Gender: Female Note Status: Finalized Procedure:         Colonoscopy Indications:       Hematochezia Providers:         [name], MD Referring MD:      Lyndon Code, MD (Referring MD) Medicines:         Propofol per Anesthesia Complications:     No immediate complications. Procedure:         Pre-Anesthesia Assessment:                    - Prior to the procedure, a History and Physical was                     performed, and patient medications and allergies were                     reviewed. The patient's tolerance of previous anesthesia                     was also reviewed. The risks and benefits of the procedure                     and the sedation options and risks were discussed with the                     patient. All questions were answered, and informed consent                     was obtained. Prior Anticoagulants: The patient has taken                     no previous anticoagulant or antiplatelet agents. ASA                     Grade Assessment: II - A patient with mild systemic                     disease. After reviewing the risks and benefits, the                     patient was deemed in satisfactory condition to undergo                     the procedure.                    After obtaining informed consent, the colonoscope was                     passed under direct vision. Throughout the procedure, the                     patient's blood pressure, pulse, and oxygen saturations                     were monitored continuously. The Olympus CF H180AL                     colonoscope (S#: [id number]) was introduced through the anus  and advanced to the the cecum, identified by appendiceal                     orifice and  ileocecal valve. The colonoscopy was performed                     without difficulty. The patient tolerated the procedure                     well. The quality of the bowel preparation was fair. Findings:      The perianal and digital rectal examinations were normal.      Non-bleeding internal hemorrhoids were found during retroflexion. The       hemorrhoids were Grade I (internal hemorrhoids that do not prolapse). Impression:        - Non-bleeding internal hemorrhoids.                    - No specimens collected. Recommendation:    - Repeat colonoscopy in 10 years for screening unless any                     change in family history or lower GI problems. Procedure Code(s): --- Professional ---                    [phone], Colonoscopy, flexible; diagnostic, including                     collection of specimen(s) by brushing or washing, when                     performed (separate procedure) Diagnosis Code(s): --- Professional ---                    K92.1, Melena CPT copyright 2014 American Medical Association. All rights reserved. The codes documented in this report are preliminary and upon coder review may  be revised to meet current compliance requirements. [name], MD 10/07/2014 11:04:51 AM This report has been signed electronically. Number of Addenda: 0 Note Initiated On: 10/07/2014 10:44 AM Scope Withdrawal Time: 0 hours 6 minutes 34 seconds  Total Procedure Duration: 0 hours 9 minutes 11 seconds       [facility]

## 2014-10-07 NOTE — Anesthesia Procedure Notes (Signed)
Procedure Name: MAC Performed by: Kriston Mckinnie Pre-anesthesia Checklist: Patient identified, Emergency Drugs available, Suction available, Patient being monitored and Timeout performed Patient Re-evaluated:Patient Re-evaluated prior to inductionOxygen Delivery Method: Nasal cannula       

## 2014-10-07 NOTE — Transfer of Care (Signed)
Immediate Anesthesia Transfer of Care Note  Patient: Brittney Duran  Procedure(s) Performed: Procedure(s) with comments: COLONOSCOPY WITH PROPOFOL (N/A) - Latex  Patient Location: PACU  Anesthesia Type: MAC  Level of Consciousness: awake, alert  and patient cooperative  Airway and Oxygen Therapy: Patient Spontanous Breathing and Patient connected to supplemental oxygen  Post-op Assessment: Post-op Vital signs reviewed, Patient's Cardiovascular Status Stable, Respiratory Function Stable, Patent Airway and No signs of Nausea or vomiting  Post-op Vital Signs: Reviewed and stable  Complications: No apparent anesthesia complications

## 2014-10-08 ENCOUNTER — Encounter: Payer: Self-pay | Admitting: Gastroenterology

## 2015-05-08 ENCOUNTER — Emergency Department (HOSPITAL_COMMUNITY): Payer: 59

## 2015-05-08 ENCOUNTER — Observation Stay (HOSPITAL_COMMUNITY)
Admission: EM | Admit: 2015-05-08 | Discharge: 2015-05-10 | Disposition: A | Discharge status: Home / Self Care | Payer: 59 | Attending: Internal Medicine | Admitting: Internal Medicine

## 2015-05-08 ENCOUNTER — Encounter (HOSPITAL_COMMUNITY): Payer: Self-pay | Admitting: Emergency Medicine

## 2015-05-08 DIAGNOSIS — Z91040 Latex allergy status: Secondary | ICD-10-CM | POA: Insufficient documentation

## 2015-05-08 DIAGNOSIS — Z6830 Body mass index (BMI) 30.0-30.9, adult: Secondary | ICD-10-CM | POA: Insufficient documentation

## 2015-05-08 DIAGNOSIS — M62838 Other muscle spasm: Secondary | ICD-10-CM | POA: Diagnosis not present

## 2015-05-08 DIAGNOSIS — Z7982 Long term (current) use of aspirin: Secondary | ICD-10-CM | POA: Insufficient documentation

## 2015-05-08 DIAGNOSIS — D649 Anemia, unspecified: Secondary | ICD-10-CM | POA: Insufficient documentation

## 2015-05-08 DIAGNOSIS — F419 Anxiety disorder, unspecified: Secondary | ICD-10-CM | POA: Diagnosis not present

## 2015-05-08 DIAGNOSIS — F410 Panic disorder [episodic paroxysmal anxiety] without agoraphobia: Secondary | ICD-10-CM | POA: Diagnosis not present

## 2015-05-08 DIAGNOSIS — N390 Urinary tract infection, site not specified: Secondary | ICD-10-CM | POA: Diagnosis not present

## 2015-05-08 DIAGNOSIS — Z880 Allergy status to penicillin: Secondary | ICD-10-CM | POA: Insufficient documentation

## 2015-05-08 DIAGNOSIS — G459 Transient cerebral ischemic attack, unspecified: Secondary | ICD-10-CM | POA: Diagnosis present

## 2015-05-08 DIAGNOSIS — R569 Unspecified convulsions: Secondary | ICD-10-CM

## 2015-05-08 DIAGNOSIS — E669 Obesity, unspecified: Secondary | ICD-10-CM | POA: Insufficient documentation

## 2015-05-08 DIAGNOSIS — Z91013 Allergy to seafood: Secondary | ICD-10-CM | POA: Diagnosis not present

## 2015-05-08 LAB — URINALYSIS, ROUTINE W REFLEX MICROSCOPIC
Bilirubin Urine: NEGATIVE
Glucose, UA: NEGATIVE mg/dL
Ketones, ur: NEGATIVE mg/dL
Nitrite: NEGATIVE
Protein, ur: NEGATIVE mg/dL
Specific Gravity, Urine: 1.009 (ref 1.005–1.030)
pH: 6.5 (ref 5.0–8.0)

## 2015-05-08 LAB — I-STAT CHEM 8, ED
BUN: 14 mg/dL (ref 6–20)
Calcium, Ion: 1.11 mmol/L — ABNORMAL LOW (ref 1.12–1.23)
Chloride: 106 mmol/L (ref 101–111)
Creatinine, Ser: 0.7 mg/dL (ref 0.44–1.00)
Glucose, Bld: 76 mg/dL (ref 65–99)
HCT: 36 % (ref 36.0–46.0)
Hemoglobin: 12.2 g/dL (ref 12.0–15.0)
Potassium: 4.5 mmol/L (ref 3.5–5.1)
Sodium: 139 mmol/L (ref 135–145)
TCO2: 21 mmol/L (ref 0–100)

## 2015-05-08 LAB — CBC
HCT: 33.8 % — ABNORMAL LOW (ref 36.0–46.0)
Hemoglobin: 10.8 g/dL — ABNORMAL LOW (ref 12.0–15.0)
MCH: 27.7 pg (ref 26.0–34.0)
MCHC: 32 g/dL (ref 30.0–36.0)
MCV: 86.7 fL (ref 78.0–100.0)
Platelets: 213 10*3/uL (ref 150–400)
RBC: 3.9 MIL/uL (ref 3.87–5.11)
RDW: 13.9 % (ref 11.5–15.5)
WBC: 6.7 10*3/uL (ref 4.0–10.5)

## 2015-05-08 LAB — URINE MICROSCOPIC-ADD ON

## 2015-05-08 LAB — DIFFERENTIAL
Basophils Absolute: 0 10*3/uL (ref 0.0–0.1)
Basophils Relative: 0 %
Eosinophils Absolute: 0.2 10*3/uL (ref 0.0–0.7)
Eosinophils Relative: 3 %
Lymphocytes Relative: 26 %
Lymphs Abs: 1.7 10*3/uL (ref 0.7–4.0)
Monocytes Absolute: 0.4 10*3/uL (ref 0.1–1.0)
Monocytes Relative: 6 %
Neutro Abs: 4.4 10*3/uL (ref 1.7–7.7)
Neutrophils Relative %: 65 %

## 2015-05-08 LAB — COMPREHENSIVE METABOLIC PANEL
ALT: 16 U/L (ref 14–54)
AST: 31 U/L (ref 15–41)
Albumin: 3.5 g/dL (ref 3.5–5.0)
Alkaline Phosphatase: 41 U/L (ref 38–126)
Anion gap: 10 (ref 5–15)
BUN: 12 mg/dL (ref 6–20)
CO2: 18 mmol/L — ABNORMAL LOW (ref 22–32)
Calcium: 9 mg/dL (ref 8.9–10.3)
Chloride: 108 mmol/L (ref 101–111)
Creatinine, Ser: 0.74 mg/dL (ref 0.44–1.00)
GFR calc Af Amer: >60 mL/min (ref >60)
GFR calc non Af Amer: >60 mL/min (ref >60)
Glucose, Bld: 77 mg/dL (ref 65–99)
Potassium: 4.6 mmol/L (ref 3.5–5.1)
Sodium: 136 mmol/L (ref 135–145)
Total Bilirubin: 0.6 mg/dL (ref 0.3–1.2)
Total Protein: 6 g/dL — ABNORMAL LOW (ref 6.5–8.1)

## 2015-05-08 LAB — PROTIME-INR
INR: 1.08 (ref 0.00–1.49)
Prothrombin Time: 14.1 seconds (ref 11.6–15.2)

## 2015-05-08 LAB — APTT: aPTT: 32 seconds (ref 24–37)

## 2015-05-08 LAB — I-STAT TROPONIN, ED: Troponin i, poc: 0 ng/mL (ref 0.00–0.08)

## 2015-05-08 MED ORDER — ONDANSETRON HCL 4 MG/2ML IJ SOLN: 4.0000 mg | Freq: Three times a day (TID) | INTRAMUSCULAR | Status: AC | PRN

## 2015-05-08 MED ORDER — ALPRAZOLAM 0.25 MG PO TABS
0.2500 mg | ORAL_TABLET | Freq: Every day | ORAL | Status: DC
  Administered 2015-05-09 (×2): 0.25 mg via ORAL
  Filled 2015-05-08 (×2): qty 1

## 2015-05-08 MED ORDER — VITAMIN D 1000 UNITS PO TABS
5000.0000 [IU] | ORAL_TABLET | Freq: Every day | ORAL | Status: DC
  Administered 2015-05-09 – 2015-05-10 (×2): 5000 [IU] via ORAL
  Filled 2015-05-08 (×2): qty 5

## 2015-05-08 MED ORDER — ASPIRIN 300 MG RE SUPP: 300.0000 mg | Freq: Every day | RECTAL | Status: DC

## 2015-05-08 MED ORDER — STROKE: EARLY STAGES OF RECOVERY BOOK
Freq: Once | Status: DC
  Filled 2015-05-08 (×2): qty 1

## 2015-05-08 MED ORDER — ASPIRIN 325 MG PO TABS
325.0000 mg | ORAL_TABLET | Freq: Every day | ORAL | Status: DC
  Administered 2015-05-09 – 2015-05-10 (×2): 325 mg via ORAL
  Filled 2015-05-08 (×2): qty 1

## 2015-05-08 MED ORDER — SODIUM CHLORIDE 0.9 % IV SOLN
INTRAVENOUS | Status: DC
  Administered 2015-05-09: via INTRAVENOUS

## 2015-05-08 MED ORDER — LORATADINE 10 MG PO TABS
10.0000 mg | ORAL_TABLET | Freq: Every day | ORAL | Status: DC
  Administered 2015-05-09 – 2015-05-10 (×2): 10 mg via ORAL
  Filled 2015-05-08 (×2): qty 1

## 2015-05-08 MED ORDER — FOSFOMYCIN TROMETHAMINE 3 G PO PACK
3.0000 g | PACK | Freq: Once | ORAL | Status: AC
  Administered 2015-05-09: 3 g via ORAL
  Filled 2015-05-08: qty 3

## 2015-05-08 MED ORDER — SENNOSIDES-DOCUSATE SODIUM 8.6-50 MG PO TABS: 1.0000 | ORAL_TABLET | Freq: Every evening | ORAL | Status: DC | PRN

## 2015-05-08 MED ORDER — DIPHENHYDRAMINE HCL 25 MG PO CAPS
25.0000 mg | ORAL_CAPSULE | Freq: Every day | ORAL | Status: DC
  Administered 2015-05-09 (×2): 25 mg via ORAL
  Filled 2015-05-08 (×2): qty 1

## 2015-05-08 MED ORDER — VITAMIN B-12 1000 MCG PO TABS: 1000.0000 ug | ORAL_TABLET | Freq: Every day | ORAL | Status: DC | PRN

## 2015-05-08 MED ORDER — LORAZEPAM 2 MG/ML IJ SOLN
1.0000 mg | Freq: Once | INTRAMUSCULAR | Status: AC
  Administered 2015-05-08: 1 mg via INTRAVENOUS
  Filled 2015-05-08: qty 1

## 2015-05-08 MED ORDER — ENOXAPARIN SODIUM 40 MG/0.4ML ~~LOC~~ SOLN
40.0000 mg | Freq: Every day | SUBCUTANEOUS | Status: DC
  Administered 2015-05-09 (×2): 40 mg via SUBCUTANEOUS
  Filled 2015-05-08 (×2): qty 0.4

## 2015-05-08 MED ORDER — VITAMIN D3 125 MCG (5000 UT) PO CAPS: 1.0000 | ORAL_CAPSULE | Freq: Every day | ORAL | Status: DC

## 2015-05-08 NOTE — ED Notes (Signed)
Per EMS:  LSN: [id number].  Pt began to experience aphasia, difficulty with recall, blurred vision (to the point of almost no vision), head pressure, heaviness in her right leg, weird sensation in her right arm.  Resolved before EMS arrived.  Pt now just complains of generalized weakness.  Denies headache.  Hx of anxiety.  Vitals stable.

## 2015-05-08 NOTE — Progress Notes (Signed)
Pt arrived to 5M07 from ED.  Pt is alert and oriented.  Family member at bedside.  Pt ambulated from stretcher to bed without difficulty.  Pt in no distress.  Safety measures in place. Will continue to monitor.   Estanislado EmmsAshley Schwarz, RN

## 2015-05-08 NOTE — H&P (Addendum)
History and Physical    Brittney Duran OIT:[id number] DOB: 1970-06-29 DOA: 05/08/2015  Referring MD/NP/PA:  PCP: Lavera Guise, MD  Outpatient Specialists: None Patient coming from:  Home   Chief Complaint: Slurred speech, blurry vision, left-sided heaviness and increased urinary frequency  HPI: Brittney Duran is a 45 y.o. female with medical history significant of anemia, anxiety, allergy, who presents with slurred speech, or vision, left-sided heaviness and increased urinary frequency.  Patient reports that she started having start speech, blurry vision in both eyes, and left-sided heaviness at about 4 PM. She also had dizziness and lightheadedness. She states that her rigth arm suddenly became flexed at the wrist and stayed that way for about 20 minutes. It went back to normal but her right hand was somewhat weak afterwards. She also reports that her memory failed her for a few moments.Patient does not have chest pain, abdominal pain, nausea, vomiting, diarrhea. Currently her symptoms have resolved. Patient states that she has increased urinary frequency recently, but no dysuria or burning on urination. Pt states that one of her cousins had blood clot and another cousin has lupus.  ED Course: pt was found to have INR 1.08, negative troponin, positive urinalysis with moderate amount of leukocytes, temperature normal, no tachycardia, no tachypnea, electrolytes and renal function okay. MRI of her brain is negative for acute intracranial abnormalities. Patient is admitted to inpatient for further urination treatment. Neurology was consulted.  Can patient participate in ADLs?  Yes   Review of Systems:   General: no fevers, chills, no changes in body weight,  has fatigue HEENT: no blurry vision, hearing changes or sore throat Pulm: no dyspnea, coughing, wheezing CV: no chest pain, no palpitations Abd: no nausea, vomiting, abdominal pain, diarrhea, constipation GU: no dysuria, burning on  urination, has increased urinary frequency, no hematuria  Ext: no leg edema Neuro: Slurred speech, blurry vision, left-sided heaviness  Skin: no rash MSK: No muscle spasm, no deformity, no limitation of range of movement in spin Heme: No easy bruising.  Travel history: No recent long distant travel.  Allergy:  Allergies  Allergen Reactions  . Penicillins Other (See Comments)    Unknown reaction Has patient had a PCN reaction causing immediate rash, facial/tongue/throat swelling, SOB or lightheadedness with hypotension: YES Has patient had a PCN reaction causing severe rash involving mucus membranes or skin necrosis: NO Has patient had a PCN reaction that required hospitalizationNO Has patient had a PCN reaction occurring within the last 10 years: NO If all of the above answers are "NO", then may proceed with Cephalosporin use.  . Shellfish Allergy Swelling    lips  . Latex Rash    Past Medical History  Diagnosis Date  . Hemorrhoids   . Anemia   . Environmental allergies   . Complication of anesthesia     pt reports "local" wears off quickly  . Leaky heart valve   . Anxiety     Panic attacks  . Wears contact lenses     Past Surgical History  Procedure Laterality Date  . Abdominal surgery    . Caesaran section  1989  . Colonoscopy with propofol N/A 10/07/2014    Procedure: COLONOSCOPY WITH PROPOFOL;  Surgeon: Lucilla Lame, MD;  Location: [place];  Service: Endoscopy;  Laterality: N/A;  Latex    Social History:  reports that she has never smoked. She has never used smokeless tobacco. She reports that she drinks alcohol. She reports that she does not use  illicit drugs.  Family History:  Family History  Problem Relation Age of Onset  . Hypertension Mother      Prior to Admission medications   Medication Sig Start Date End Date Taking? Authorizing Provider  ALPRAZolam (XANAX) 0.25 MG tablet Take 0.25 mg by mouth at bedtime. Take one every night per patient    Yes Historical Provider, MD  cetirizine-pseudoephedrine (ZYRTEC-D) 5-120 MG per tablet Take 1 tablet by mouth 2 (two) times daily as needed for allergies.   Yes Historical Provider, MD  Cholecalciferol (VITAMIN D3) 5000 units CAPS Take 1 capsule by mouth daily.   Yes Historical Provider, MD  diphenhydrAMINE (BENADRYL) 25 MG tablet Take 25 mg by mouth at bedtime. For allergies. Take every night per patient   Yes Historical Provider, MD  Bennett IUD IUD 1 each by Intrauterine route once.   Yes Historical Provider, MD  diazepam (VALIUM) 5 MG tablet Take 1 tablet (5 mg total) by mouth every 8 (eight) hours as needed for anxiety. Patient not taking: Reported on 10/02/2014 05/27/14 05/27/15  Earleen Newport, MD  vitamin B-12 (CYANOCOBALAMIN) 1000 MCG tablet Take 1,000 mcg by mouth daily as needed (energy). Reported on 05/08/2015    Historical Provider, MD    Physical Exam: Filed Vitals:   05/08/15 2300 05/08/15 2315 05/08/15 2340 05/08/15 2350  BP:  111/56 117/78   Pulse: 78 69 69   Temp:   98 F (36.7 C)   TempSrc:   Oral   Resp: 14 16 18    Height:    5' 7"  (1.702 m)  Weight:    87.317 kg (192 lb 8 oz)  SpO2: 100% 100% 98%    General: Not in acute distress HEENT:       Eyes: PERRL, EOMI, no scleral icterus.       ENT: No discharge from the ears and nose, no pharynx injection, no tonsillar enlargement.        Neck: No JVD, no bruit, no mass felt. Heme: No neck lymph node enlargement. Cardiac: S1/S2, RRR, No murmurs, No gallops or rubs. Pulm: No rales, wheezing, rhonchi or rubs. Abd: Soft, nondistended, nontender, no rebound pain, no organomegaly, BS present. GU: No hematuria Ext: No pitting leg edema bilaterally. 2+DP/PT pulse bilaterally. Musculoskeletal: No joint deformities, No joint redness or warmth, no limitation of ROM in spin. Skin: No rashes.  Neuro: Alert, oriented X3, cranial nerves II-XII grossly intact, moves all extremities normally. Muscle strength 5/5  in all extremities, sensation to light touch intact. Knee reflex 1+ bilaterally. Negative Babinski's sign. Normal finger to nose test. Psych: Patient is not psychotic, no suicidal or hemocidal ideation.  Labs on Admission: I have personally reviewed following labs and imaging studies  CBC:  Recent Labs Lab 05/08/15 1952 05/08/15 2000  WBC 6.7  --   NEUTROABS 4.4  --   HGB 10.8* 12.2  HCT 33.8* 36.0  MCV 86.7  --   PLT 213  --    Basic Metabolic Panel:  Recent Labs Lab 05/08/15 1952 05/08/15 2000  NA 136 139  K 4.6 4.5  CL 108 106  CO2 18*  --   GLUCOSE 77 76  BUN 12 14  CREATININE 0.74 0.70  CALCIUM 9.0  --    GFR: Estimated Creatinine Clearance: 102.9 mL/min (by C-G formula based on Cr of 0.7). Liver Function Tests:  Recent Labs Lab 05/08/15 1952  AST 31  ALT 16  ALKPHOS 41  BILITOT 0.6  PROT 6.0*  ALBUMIN 3.5   No results for input(s): LIPASE, AMYLASE in the last 168 hours. No results for input(s): AMMONIA in the last 168 hours. Coagulation Profile:  Recent Labs Lab 05/08/15 1952  INR 1.08   Cardiac Enzymes: No results for input(s): CKTOTAL, CKMB, CKMBINDEX, TROPONINI in the last 168 hours. BNP (last 3 results) No results for input(s): PROBNP in the last 8760 hours. HbA1C: No results for input(s): HGBA1C in the last 72 hours. CBG: No results for input(s): GLUCAP in the last 168 hours. Lipid Profile: No results for input(s): CHOL, HDL, LDLCALC, TRIG, CHOLHDL, LDLDIRECT in the last 72 hours. Thyroid Function Tests: No results for input(s): TSH, T4TOTAL, FREET4, T3FREE, THYROIDAB in the last 72 hours. Anemia Panel: No results for input(s): VITAMINB12, FOLATE, FERRITIN, TIBC, IRON, RETICCTPCT in the last 72 hours. Urine analysis:    Component Value Date/Time   COLORURINE RED* 05/08/2015 2106   APPEARANCEUR CLOUDY* 05/08/2015 2106   LABSPEC 1.009 05/08/2015 2106   PHURINE 6.5 05/08/2015 2106   GLUCOSEU NEGATIVE 05/08/2015 2106   HGBUR LARGE*  05/08/2015 2106   BILIRUBINUR NEGATIVE 05/08/2015 2106   KETONESUR NEGATIVE 05/08/2015 2106   PROTEINUR NEGATIVE 05/08/2015 2106   NITRITE NEGATIVE 05/08/2015 2106   LEUKOCYTESUR MODERATE* 05/08/2015 2106   Sepsis Labs: @LABRCNTIP (procalcitonin:4,lacticidven:4) )No results found for this or any previous visit (from the past 240 hour(s)).   Radiological Exams on Admission: Mr Brain Wo Contrast  05/08/2015  CLINICAL DATA:  Stroke symptoms. Expressive aphasia. Blurred vision. Head pressure. Heaviness right leg and arm. EXAM: MRI HEAD WITHOUT CONTRAST TECHNIQUE: Multiplanar, multiecho pulse sequences of the brain and surrounding structures were obtained without intravenous contrast. COMPARISON:  None. FINDINGS: Negative for acute infarct.  No significant chronic ischemic change Ventricle size normal.  Cerebral volume normal. Negative for intracranial hemorrhage. No fluid collection. Negative for mass or edema. No shift of the midline structures. Pituitary and skull base normal. Orbit normal. Paranasal sinuses clear. IMPRESSION: Normal Electronically Signed   By: Franchot Gallo M.D.   On: 05/08/2015 21:14     EKG: Independently reviewed. QTC 438, no ischemic change  Assessment/Plan Principal Problem:   TIA (transient ischemic attack) Active Problems:   Anxiety   Anemia   UTI (urinary tract infection)   Seizures (HCC)   TIA (transient ischemic attack) vs seizure: Patient symptoms are concerning for TIA. She also has component of possible seizure activity. Neurology was consulted. Dr. Wendee Beavers saw pt, and recommended EEG and brief vascular workup before discharge.  - will admit to tele bed for observation - Appreciate Dr. Jorge Mandril consultation, the follow-up recommendations - EEG - Risk factor modification: HgbA1c, fasting lipid panel and UDS - MRA of the brain without contrast  - PT consult, OT consult, Speech consult  - 2 d Echocardiogram  - Ekg  - Carotid dopplers  - Aspirin - I will  also check crp, ESR, HIV antibody, ANA and hypercoagulable panel  Anxiety: -continue Xanax  Possible UTI: Patient has a increased urinary frequency, but no dysuria or burning on urination. Urinalysis is positive with moderate amount of leukocytes. -will give one dose of fosfomycin -Follow-up urine culture  DVT ppx: SQ Lovenox Code Status: Full code Family Communication: Yes, patient's husband and 2 daughters  at bed side Disposition Plan:  Anticipate discharge back to previous home environment Consults called: Neuro, Dr. Wendee Beavers Admission status:   obs / tele    Date of Service 05/08/2015    Ivor Costa [facility] Pager [phone]  If 7PM-7AM, please  contact night-coverage [email or web address] Password [facility] 05/08/2015, 11:58 PM

## 2015-05-08 NOTE — Consult Note (Addendum)
Neurology Consultation Reason for Consult: right facial tingling Referring Physician: Dr Radford PaxBeaton CC: as above  History is obtained from pateint and husband  HPI: Brittney Duran is a 45 y.o. female with hx of anxiety and depression today went to the gym and shorly after started to feel that her have was tingly on the right side.  She felt lightheaded. Drove home and was feeling dizzy during the drive.  At home her vision became blurry more on the right side than on the left and her rigth arm suddenly became flexed at the wrist and stayed that way for about 20 minutes.  She started crying.  She could not move it or control it and after 20 minutes it went back to normal but her right hand was somewhat weak afterwards.  She also had a moment before the hand turned inward when she could not speak for a few minutes to her husband.  She also was confused about what she was about to say and feels that her memory failed her for a few moments.  She has never had anything like this before. She has no hx of stroke, TIA or seizure. She is not on antiplatelets.  She has no hx of head trauma, headaches, encephalitis, meningitis. She has a grandchild who had one febrile seizure at age 455.     ROS: A 14 point ROS was performed and is negative except as noted in the HPI.status.   Past Medical History  Diagnosis Date  . Hemorrhoids   . Anemia   . Environmental allergies   . Complication of anesthesia     pt reports "local" wears off quickly  . Leaky heart valve   . Anxiety     Panic attacks  . Wears contact lenses     Family History  Problem Relation Age of Onset  . Hypertension Mother     Social History:  reports that she has never smoked. She has never used smokeless tobacco. She reports that she drinks alcohol. She reports that she does not use illicit drugs.  Exam: Current vital signs: BP 131/69 mmHg  Pulse 78  Temp(Src) 98.3 F (36.8 C) (Oral)  Resp 14  SpO2 100%  LMP 05/08/2015 Vital signs  in last 24 hours: Temp:  [98.3 F (36.8 C)] 98.3 F (36.8 C) (04/20 1908) Pulse Rate:  [63-80] 78 (04/20 2300) Resp:  [12-20] 14 (04/20 2300) BP: (123-136)/(69-81) 131/69 mmHg (04/20 2245) SpO2:  [98 %-100 %] 100 % (04/20 2300)   Physical Exam  Constitutional: Appears well-developed and well-nourished.  Psych: Affect appropriate to situation Eyes: No scleral injection HENT: No OP obstrucion Head: Normocephalic.  Cardiovascular: Normal rate and regular rhythm.  Respiratory: Effort normal and breath sounds normal to anterior ascultation GI: Soft.  No distension. There is no tenderness.  Skin: WDI  Neuro: Mental Status: Patient is awake, alert, oriented to person, place, month, year, and situation Patient is able to give a clear and coherent history. No signs of aphasia or neglect Cranial Nerves: II: Visual Fields are full. Pupils are equal, round, and reactive to light. III,IV, VI: EOMI without ptosis or diploplia.  V: Facial sensation is symmetric to temperature VII: Facial movement is symmetric.  VIII: hearing is intact to voice X: Uvula elevates symmetrically XI: Shoulder shrug is symmetric. XII: tongue is midline without atrophy or fasciculations.  Motor: Tone is normal. Bulk is normal. 5/5 strength was present in all four extremities. Sensory: Sensation is symmetric to light touch and temperature  in the arms and legs Deep Tendon Reflexes: 2+ and symmetric in the biceps and patellae. Plantars: Toes are downgoing bilaterally. Cerebellar: FNF and HKS are intact bilaterally    I have reviewed labs in epic and the results pertinent to this consultation are: all normal - mild anemia  I have reviewed the images obtained: MRI brain without contrast is normal.  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  Symptoms affected speech, vision, and motor function of the right arm, and cognition.  She is still not feeling completely at baseline she  tells me and I think it is reasonable to observe her overnight and do EEG in the am.  Other than the motor tonic activity of the right hand the event nearly looked like a TIA and thus I would also like to make sure she gets a brief vascular workup before discharge.  The stroke/or neurohospitalist attending will be following her in the AM.  Please note that I clearly told the patient she is not to drive until cleared by an outpatient neurologist given that the description of her sx is c/w a complex partial seizure.  She understood and agreed.  I gave this infomration in front of her husband.  Recommendations: 1) as above

## 2015-05-08 NOTE — ED Notes (Signed)
MD at bedside. 

## 2015-05-08 NOTE — ED Provider Notes (Signed)
CSN: [id number]     Arrival date & time 05/08/15  1901 History   First MD Initiated Contact with Patient 05/08/15 1903     Chief Complaint  Patient presents with  . stroke-like symptoms       HPI Pt began to experience aphasia, difficulty with recall, blurred vision (to the point of almost no vision), head pressure, heaviness in her right leg, weird sensation in her right arm. Resolved before EMS arrived. Pt now just complains of generalized weakness. Denies headache. Hx of anxiety.  Past Medical History  Diagnosis Date  . Hemorrhoids   . Anemia   . Environmental allergies   . Complication of anesthesia     pt reports "local" wears off quickly  . Leaky heart valve   . Anxiety     Panic attacks  . Wears contact lenses    Past Surgical History  Procedure Laterality Date  . Abdominal surgery    . Caesaran section  1989  . Colonoscopy with propofol N/A 10/07/2014    Procedure: COLONOSCOPY WITH PROPOFOL;  Surgeon: Midge Miniumarren Wohl, MD;  [facility];  Service: Endoscopy;  Laterality: N/A;  Latex   Family History  Problem Relation Age of Onset  . Hypertension Mother    Social History  Substance Use Topics  . Smoking status: Never Smoker   . Smokeless tobacco: Never Used  . Alcohol Use: Yes     Comment: "socially"   OB History    No data available     Review of Systems  Neurological: Positive for speech difficulty and numbness.  All other systems reviewed and are negative.     Allergies  Penicillins; Shellfish allergy; and Latex  Home Medications   Prior to Admission medications   Medication Sig Start Date End Date Taking? Authorizing Provider  ALPRAZolam (XANAX) 0.25 MG tablet Take 0.25 mg by mouth at bedtime. Take one every night per patient   Yes Historical Provider, MD  cetirizine-pseudoephedrine (ZYRTEC-D) 5-120 MG per tablet Take 1 tablet by mouth 2 (two) times daily as needed for allergies.   Yes Historical Provider, MD  Cholecalciferol  (VITAMIN D3) 5000 units CAPS Take 1 capsule by mouth daily.   Yes Historical Provider, MD  diphenhydrAMINE (BENADRYL) 25 MG tablet Take 25 mg by mouth at bedtime. For allergies. Take every night per patient   Yes Historical Provider, MD  PARAGARD INTRAUTERINE COPPER IUD IUD 1 each by Intrauterine route once.   Yes Historical Provider, MD  diazepam (VALIUM) 5 MG tablet Take 1 tablet (5 mg total) by mouth every 8 (eight) hours as needed for anxiety. Patient not taking: Reported on 10/02/2014 05/27/14 05/27/15  Emily FilbertJonathan E Williams, MD  vitamin B-12 (CYANOCOBALAMIN) 1000 MCG tablet Take 1,000 mcg by mouth daily as needed (energy). Reported on 05/08/2015    Historical Provider, MD   BP 131/81 mmHg  Pulse 69  Temp(Src) 98.3 F (36.8 C) (Oral)  Resp 16  SpO2 100%  LMP 05/08/2015 Physical Exam  Constitutional: She is oriented to person, place, and time. She appears well-developed and well-nourished. No distress.  HENT:  Head: Normocephalic and atraumatic.  Eyes: Pupils are equal, round, and reactive to light.  Neck: Normal range of motion.  Cardiovascular: Normal rate and intact distal pulses.   Pulmonary/Chest: No respiratory distress.  Abdominal: Normal appearance. She exhibits no distension.  Musculoskeletal: Normal range of motion.  Neurological: She is alert and oriented to person, place, and time. She has normal strength. No cranial nerve deficit  or sensory deficit. GCS eye subscore is 4. GCS verbal subscore is 5. GCS motor subscore is 6.  Skin: Skin is warm and dry. No rash noted.  Psychiatric: She has a normal mood and affect. Her behavior is normal.  Nursing note and vitals reviewed.   ED Course  Procedures (including critical care time) Labs Review Labs Reviewed  CBC - Abnormal; Notable for the following:    Hemoglobin 10.8 (*)    HCT 33.8 (*)    All other components within normal limits  COMPREHENSIVE METABOLIC PANEL - Abnormal; Notable for the following:    CO2 18 (*)    Total  Protein 6.0 (*)    All other components within normal limits  URINALYSIS, ROUTINE W REFLEX MICROSCOPIC (NOT AT [facility]) - Abnormal; Notable for the following:    Color, Urine RED (*)    APPearance CLOUDY (*)    Hgb urine dipstick LARGE (*)    Leukocytes, UA MODERATE (*)    All other components within normal limits  URINE MICROSCOPIC-ADD ON - Abnormal; Notable for the following:    Squamous Epithelial / LPF 0-5 (*)    Bacteria, UA FEW (*)    All other components within normal limits  I-STAT CHEM 8, ED - Abnormal; Notable for the following:    Calcium, Ion 1.11 (*)    All other components within normal limits  PROTIME-INR  APTT  DIFFERENTIAL  Rosezena Sensor, ED    Imaging Review Mr Brain Wo Contrast  05/08/2015  CLINICAL DATA:  Stroke symptoms. Expressive aphasia. Blurred vision. Head pressure. Heaviness right leg and arm. EXAM: MRI HEAD WITHOUT CONTRAST TECHNIQUE: Multiplanar, multiecho pulse sequences of the brain and surrounding structures were obtained without intravenous contrast. COMPARISON:  None. FINDINGS: Negative for acute infarct.  No significant chronic ischemic change Ventricle size normal.  Cerebral volume normal. Negative for intracranial hemorrhage. No fluid collection. Negative for mass or edema. No shift of the midline structures. Pituitary and skull base normal. Orbit normal. Paranasal sinuses clear. IMPRESSION: Normal Electronically Signed   By: Marlan Palau M.D.   On: 05/08/2015 21:14   I have personally reviewed and evaluated these images and lab results as part of my medical decision-making.   EKG Interpretation   Date/Time:  Thursday May 08 2015 19:07:09 EDT Ventricular Rate:  68 PR Interval:  164 QRS Duration: 87 QT Interval:  412 QTC Calculation: 438 R Axis:   78 Text Interpretation:  Sinus rhythm Normal ECG Confirmed by Namiyah Grantham  MD,  Ninoska Goswick ([id number]) on 05/08/2015 7:57:51 PM     Consulted neurology who will see the patient.  Patient to be admitted to  the hospitalist service.  Patient back to baseline and is stable for admission. MDM   Final diagnoses:  Transient cerebral ischemia, unspecified transient cerebral ischemia type        Nelva Nay, MD 05/08/15 2250

## 2015-05-09 ENCOUNTER — Other Ambulatory Visit (HOSPITAL_COMMUNITY): Payer: 59

## 2015-05-09 ENCOUNTER — Observation Stay (HOSPITAL_BASED_OUTPATIENT_CLINIC_OR_DEPARTMENT_OTHER)
Admit: 2015-05-09 | Discharge: 2015-05-09 | Disposition: A | Discharge status: Home / Self Care | Payer: 59 | Attending: Internal Medicine | Admitting: Internal Medicine

## 2015-05-09 ENCOUNTER — Observation Stay (HOSPITAL_COMMUNITY): Payer: 59

## 2015-05-09 ENCOUNTER — Observation Stay (HOSPITAL_BASED_OUTPATIENT_CLINIC_OR_DEPARTMENT_OTHER): Payer: 59

## 2015-05-09 DIAGNOSIS — R569 Unspecified convulsions: Secondary | ICD-10-CM

## 2015-05-09 DIAGNOSIS — M62838 Other muscle spasm: Secondary | ICD-10-CM

## 2015-05-09 DIAGNOSIS — G459 Transient cerebral ischemic attack, unspecified: Secondary | ICD-10-CM

## 2015-05-09 DIAGNOSIS — G452 Multiple and bilateral precerebral artery syndromes: Secondary | ICD-10-CM | POA: Diagnosis not present

## 2015-05-09 DIAGNOSIS — F419 Anxiety disorder, unspecified: Secondary | ICD-10-CM | POA: Diagnosis not present

## 2015-05-09 DIAGNOSIS — D508 Other iron deficiency anemias: Secondary | ICD-10-CM | POA: Diagnosis not present

## 2015-05-09 DIAGNOSIS — G458 Other transient cerebral ischemic attacks and related syndromes: Secondary | ICD-10-CM | POA: Diagnosis not present

## 2015-05-09 LAB — C-REACTIVE PROTEIN: CRP: <0.5 mg/dL (ref <1.0)

## 2015-05-09 LAB — LIPID PANEL
Cholesterol: 128 mg/dL (ref 0–200)
HDL: 41 mg/dL (ref >40)
LDL Cholesterol: 71 mg/dL (ref 0–99)
Total CHOL/HDL Ratio: 3.1 RATIO
Triglycerides: 79 mg/dL (ref <150)
VLDL: 16 mg/dL (ref 0–40)

## 2015-05-09 LAB — GLUCOSE, CAPILLARY
Glucose-Capillary: 91 mg/dL (ref 65–99)
Glucose-Capillary: 78 mg/dL (ref 65–99)
Glucose-Capillary: 88 mg/dL (ref 65–99)
Glucose-Capillary: 94 mg/dL (ref 65–99)

## 2015-05-09 LAB — HIV ANTIBODY (ROUTINE TESTING W REFLEX): HIV Screen 4th Generation wRfx: NONREACTIVE

## 2015-05-09 LAB — RAPID URINE DRUG SCREEN, HOSP PERFORMED
Amphetamines: NOT DETECTED
Barbiturates: NOT DETECTED
Benzodiazepines: POSITIVE — AB
Cocaine: NOT DETECTED
Opiates: NOT DETECTED
Tetrahydrocannabinol: NOT DETECTED

## 2015-05-09 LAB — SEDIMENTATION RATE: Sed Rate: 7 mm/hr (ref 0–22)

## 2015-05-09 LAB — ANTITHROMBIN III: AntiThromb III Func: 99 % (ref 75–120)

## 2015-05-09 NOTE — Progress Notes (Signed)
EEG Completed; Results Pending  

## 2015-05-09 NOTE — Progress Notes (Signed)
PT Cancellation Note  Patient Details Name: Brittney Duran MRN: [id number] DOB: 28-Nov-1970   Cancelled Treatment:    Reason Eval/Treat Not Completed: Patient at procedure or test/unavailable.  Will try later as time and pt allow.   Ivar DrapeStout, Derreck Wiltsey E 05/09/2015, 7:57 AM   Samul Dadauth Doss Cybulski, PT MS Acute Rehab Dept. Number: [facility] [id number] and [facility] [phone]

## 2015-05-09 NOTE — Procedures (Signed)
ELECTROENCEPHALOGRAM REPORT  Date of Study: 05/09/2015  Patient's Name: Brittney Duran MRN: [id number] Date of Birth: 06-30-1970  Referring Provider: Dr. Lorretta HarpXilin Niu  Clinical History: This is a 45 year old woman with right-sided tingling, dizziness, blurred vision, then right arm became flexed at the wrist, some confusion.  Medications: ALPRAZolam (XANAX) tablet 0.25 mg aspirin tablet 325 mg cholecalciferol (VITAMIN D) tablet 5,000 Units diphenhydrAMINE (BENADRYL) capsule 25 mg vitamin B-12 (CYANOCOBALAMIN) tablet 1,000 mcg  Technical Summary: A multichannel digital EEG recording measured by the international 10-20 system with electrodes applied with paste and impedances below 5000 ohms performed in our laboratory with EKG monitoring in an awake and asleep patient.  Hyperventilation was not performed. Photic stimulation was performed.  The digital EEG was referentially recorded, reformatted, and digitally filtered in a variety of bipolar and referential montages for optimal display.    Description: The patient is awake and asleep during the recording.  During maximal wakefulness, there is a symmetric, medium voltage 10.5 Hz posterior dominant rhythm that attenuates with eye opening.  The record is symmetric.  During drowsiness and sleep, there is an increase in theta slowing of the background.  Vertex waves and symmetric sleep spindles were seen.  Photic stimulation did not elicit any abnormalities.  There were no epileptiform discharges or electrographic seizures seen.    EKG lead was unremarkable.  Impression: This awake and asleep EEG is normal.    Clinical Correlation: A normal EEG does not exclude a clinical diagnosis of epilepsy. Clinical correlation is advised.   Patrcia DollyKaren Jeris Roser, M.D.

## 2015-05-09 NOTE — Progress Notes (Signed)
[facility] PROGRESS NOTE  Brittney Duran EUM:[id number] DOB: 04-25-1970 DOA: 05/08/2015   PCP: Lavera Guise, MD     Assessment/Plan: Principal Problem:   TIA (transient ischemic attack) Active Problems:   Anxiety   Anemia   UTI (urinary tract infection)   Seizures ([place])   45 y.o. female with medical history significant of anemia, anxiety, allergy, who presents with slurred speech, or vision, left-sided heaviness and increased urinary frequency.Patient reports that she started having start speech, blurry vision in both eyes, and left-sided heaviness at about 4 PM. She also had dizziness and lightheadedness. She states that her rigth arm suddenly became flexed at the wrist and stayed that way for about 20 minutes. It went back to normal but her right hand was somewhat weak afterwards. She also reports that her memory failed her for a few moments.Patient does not have chest pain, abdominal pain, nausea, vomiting, diarrhea.  Marland Kitchen MRI of her brain is negative for acute intracranial abnormalities. Patient is admitted to inpatient for further evaluation.  Assessment and plan TIA (transient ischemic attack) vs seizure: Patient symptoms are concerning for TIA. She also has component of possible seizure activity. Neurology was consulted. Dr. Wendee Beavers saw pt, and recommended EEG and brief vascular workup before discharge.Continue telemetry - Appreciate Dr. Jorge Mandril consultation, the follow-up recommendations - EEG within normal limits - Risk factor modification: HgbA1c, fasting lipid panel and UDS - MRA of the brain without contrast negative, MRA negative - PT consult, OT consult, Speech consult pending - 2 d Echocardiogram pending - Carotid dopplers  pending - Aspirin 325 milligrams a day   crp, ESR, HIV antibody, ANA and hypercoagulable panel-negative  Anxiety: -continue Xanax  Possible UTI: Patient has a increased urinary frequency, but no dysuria or burning on urination.  Urinalysis is positive with moderate amount of leukocytes. Start Cipro and follow urine culture closely     DVT prophylaxsis   Code Status:  Full code   Family Communication: Discussed in detail with the patient, all imaging results, lab results explained to the patient   Disposition Plan:  Anticipate discharge in one to 2 days      Consultants:  None  Procedures:  None  Antibiotics:  Ciprofloxacin 4/21-    HPI/Subjective: No focal deficits, unable to ambulate due to bed alarm  Objective: Filed Vitals:   05/09/15 0600 05/09/15 0826 05/09/15 0957 05/09/15 1026  BP: 109/74 111/66 116/60 115/65  Pulse: 64 67 71 66  Temp: 97.8 F (36.6 C) 97.8 F (36.6 C) 98.2 F (36.8 C) 98.2 F (36.8 C)  TempSrc: Oral Oral Oral Oral  Resp: 14 16 18 16   Height:      Weight:      SpO2: 100% 100% 100% 100%   No intake or output data in the 24 hours ending 05/09/15 1213  Exam:  Examination:  General exam: Appears calm and comfortable  Respiratory system: Clear to auscultation. Respiratory effort normal. Cardiovascular system: S1 & S2 heard, RRR. No JVD, murmurs, rubs, gallops or clicks. No pedal edema. Gastrointestinal system: Abdomen is nondistended, soft and nontender. No organomegaly or masses felt. Normal bowel sounds heard. Central nervous system: Alert and oriented. No focal neurological deficits. Extremities: Symmetric 5 x 5 power. Skin: No rashes, lesions or ulcers Psychiatry: Judgement and insight appear normal. Mood & affect appropriate.     Data Reviewed: I have personally reviewed following labs and imaging studies  Micro Results No results found for this or any  previous visit (from the past 240 hour(s)).  Radiology Reports Mr Brain Wo Contrast  05/08/2015  CLINICAL DATA:  Stroke symptoms. Expressive aphasia. Blurred vision. Head pressure. Heaviness right leg and arm. EXAM: MRI HEAD WITHOUT CONTRAST TECHNIQUE: Multiplanar, multiecho pulse sequences of the  brain and surrounding structures were obtained without intravenous contrast. COMPARISON:  None. FINDINGS: Negative for acute infarct.  No significant chronic ischemic change Ventricle size normal.  Cerebral volume normal. Negative for intracranial hemorrhage. No fluid collection. Negative for mass or edema. No shift of the midline structures. Pituitary and skull base normal. Orbit normal. Paranasal sinuses clear. IMPRESSION: Normal Electronically Signed   By: Franchot Gallo M.D.   On: 05/08/2015 21:14   Mr [name] Head/brain Wo Cm  05/09/2015  CLINICAL DATA:  Follow-up brain MRI. Dizziness and right-sided tingling. EXAM: MRA HEAD WITHOUT CONTRAST TECHNIQUE: Angiographic images of the Circle of Willis were obtained using MRA technique without intravenous contrast. COMPARISON:  Brain MRI from yesterday FINDINGS: Symmetric carotid arteries. Smooth and widely patent carotid siphons. Symmetric A1 and M1 segments which are widely patent. No major branch occlusion. No vessel beading. Negative for aneurysm when accounting for presumed right posterior communicating artery infundibulum. Symmetric vertebral arteries and vertebrobasilar branching. 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 No major branch occlusion, stenosis, or aneurysm. IMPRESSION: Negative MRA. Electronically Signed   By: Monte Fantasia M.D.   On: 05/09/2015 08:23     CBC  Recent Labs Lab 05/08/15 1952 05/08/15 2000  WBC 6.7  --   HGB 10.8* 12.2  HCT 33.8* 36.0  PLT 213  --   MCV 86.7  --   MCH 27.7  --   MCHC 32.0  --   RDW 13.9  --   LYMPHSABS 1.7  --   MONOABS 0.4  --   EOSABS 0.2  --   BASOSABS 0.0  --     Chemistries   Recent Labs Lab 05/08/15 1952 05/08/15 2000  NA 136 139  K 4.6 4.5  CL 108 106  CO2 18*  --   GLUCOSE 77 76  BUN 12 14  CREATININE 0.74 0.70  CALCIUM 9.0  --   AST 31  --   ALT 16  --   ALKPHOS 41  --   BILITOT 0.6  --     ------------------------------------------------------------------------------------------------------------------ estimated creatinine clearance is 102.9 mL/min (by C-G formula based on Cr of 0.7). ------------------------------------------------------------------------------------------------------------------ No results for input(s): HGBA1C in the last 72 hours. ------------------------------------------------------------------------------------------------------------------  Recent Labs  05/09/15 0252  CHOL 128  HDL 41  LDLCALC 71  TRIG 79  CHOLHDL 3.1   ------------------------------------------------------------------------------------------------------------------ No results for input(s): TSH, T4TOTAL, T3FREE, THYROIDAB in the last 72 hours.  Invalid input(s): FREET3 ------------------------------------------------------------------------------------------------------------------ No results for input(s): VITAMINB12, FOLATE, FERRITIN, TIBC, IRON, RETICCTPCT in the last 72 hours.  Coagulation profile  Recent Labs Lab 05/08/15 1952  INR 1.08    No results for input(s): DDIMER in the last 72 hours.  Cardiac Enzymes No results for input(s): CKMB, TROPONINI, MYOGLOBIN in the last 168 hours.  Invalid input(s): CK ------------------------------------------------------------------------------------------------------------------ Invalid input(s): POCBNP   CBG:  Recent Labs Lab 05/09/15 0640  GLUCAP 91       Studies: Mr Brain Wo Contrast  05/08/2015  CLINICAL DATA:  Stroke symptoms. Expressive aphasia. Blurred vision. Head pressure. Heaviness right leg and arm. EXAM: MRI HEAD WITHOUT CONTRAST TECHNIQUE: Multiplanar, multiecho pulse sequences of the brain and surrounding structures were obtained without intravenous contrast. COMPARISON:  None. FINDINGS: Negative for  acute infarct.  No significant chronic ischemic change Ventricle size normal.  Cerebral volume  normal. Negative for intracranial hemorrhage. No fluid collection. Negative for mass or edema. No shift of the midline structures. Pituitary and skull base normal. Orbit normal. Paranasal sinuses clear. IMPRESSION: Normal Electronically Signed   By: Franchot Gallo M.D.   On: 05/08/2015 21:14   Mr [name] Head/brain Wo Cm  05/09/2015  CLINICAL DATA:  Follow-up brain MRI. Dizziness and right-sided tingling. EXAM: MRA HEAD WITHOUT CONTRAST TECHNIQUE: Angiographic images of the Circle of Willis were obtained using MRA technique without intravenous contrast. COMPARISON:  Brain MRI from yesterday FINDINGS: Symmetric carotid arteries. Smooth and widely patent carotid siphons. Symmetric A1 and M1 segments which are widely patent. No major branch occlusion. No vessel beading. Negative for aneurysm when accounting for presumed right posterior communicating artery infundibulum. Symmetric vertebral arteries and vertebrobasilar branching. 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 No major branch occlusion, stenosis, or aneurysm. IMPRESSION: Negative MRA. Electronically Signed   By: Monte Fantasia M.D.   On: 05/09/2015 08:23      No results found for: HGBA1C Lab Results  Component Value Date   LDLCALC 71 05/09/2015   CREATININE 0.70 05/08/2015       Scheduled Meds: .  stroke: mapping our early stages of recovery book   Does not apply Once  . ALPRAZolam  0.25 mg Oral QHS  . aspirin  300 mg Rectal Daily   Or  . aspirin  325 mg Oral Daily  . cholecalciferol  5,000 Units Oral Daily  . diphenhydrAMINE  25 mg Oral QHS  . enoxaparin (LOVENOX) injection  40 mg Subcutaneous QHS  . loratadine  10 mg Oral Daily   Continuous Infusions: . sodium chloride 75 mL/hr at 05/09/15 0014        Time spent: >30 MINS    [facility]  [facility] Pager [phone]. If 7PM-7AM, please contact night-coverage at [email or web address], password [facility] 05/09/2015, 12:13 PM

## 2015-05-09 NOTE — Care Management Note (Signed)
Case Management Note  Patient Details  Name: Brittney Duran MRN: [id number] Date of Birth: [birth year]  Subjective/Objective:   Pt in with TIA. MRI results negative. Pt is from home with spouse.                   Action/Plan: Awaiting PT/OT recs. CM following for discharge disposition.   Expected Discharge Date:                  Expected Discharge Plan:     In-House Referral:     Discharge planning Services     Post Acute Care Choice:    Choice offered to:     DME Arranged:    DME Agency:     HH Arranged:    HH Agency:     Status of Service:  In process, will continue to follow  Medicare Important Message Given:    Date Medicare IM Given:    Medicare IM give by:    Date Additional Medicare IM Given:    Additional Medicare Important Message give by:     If discussed at Long Length of Stay Meetings, dates discussed:    Additional Comments:  Kermit BaloKelli F Burdette Forehand, RN 05/09/2015, 10:59 AM

## 2015-05-09 NOTE — Progress Notes (Signed)
STROKE TEAM PROGRESS NOTE   HISTORY OF PRESENT ILLNESS Brittney Duran is a 45 y.o. female with hx of anxiety and depression today went to the gym and shorly after started to feel that her have was tingly on the right side. She felt lightheaded. Drove home and was feeling dizzy during the drive. At home her vision became blurry more on the right side than on the left and her rigth arm suddenly became flexed at the wrist and stayed that way for about 20 minutes. She started crying. She could not move it or control it and after 20 minutes it went back to normal but her right hand was somewhat weak afterwards. She also had a moment before the hand turned inward when she could not speak for a few minutes to her husband. She also was confused about what she was about to say and feels that her memory failed her for a few moments. She has never had anything like this before. She has no hx of stroke, TIA or seizure. She is not on antiplatelets. She has no hx of head trauma, headaches, encephalitis, meningitis. She has a grandchild who had one febrile seizure at age 33. Patient was not administered IV t-PA secondary. She was admitted for further evaluation and treatment.   SUBJECTIVE (INTERVAL HISTORY) Her husband and family members are at the bedside. Dr. Pearlean Brownie reviewed her medical record and the patient recounted her HPI with him.  She was worried that her hand was turning in, they prayed and called EMS. Overall she feels her condition is stable. 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    OBJECTIVE Temp:  [97.8 F (36.6 C)-98.3 F (36.8 C)] 98.2 F (36.8 C) (04/21 0957) Pulse Rate:  [63-80] 71 (04/21 0957) Cardiac Rhythm:  [-] Normal sinus rhythm (04/21 0842) Resp:  [12-20] 18 (04/21 0957) BP: (109-136)/(56-81) 116/60 mmHg (04/21 0957) SpO2:  [98 %-100 %] 100 % (04/21 0957) Weight:  [87.317 kg  (192 lb 8 oz)] 87.317 kg (192 lb 8 oz) (04/20 2350)  CBC:   Recent Labs Lab 05/08/15 1952 05/08/15 2000  WBC 6.7  --   NEUTROABS 4.4  --   HGB 10.8* 12.2  HCT 33.8* 36.0  MCV 86.7  --   PLT 213  --     Basic Metabolic Panel:   Recent Labs Lab 05/08/15 1952 05/08/15 2000  NA 136 139  K 4.6 4.5  CL 108 106  CO2 18*  --   GLUCOSE 77 76  BUN 12 14  CREATININE 0.74 0.70  CALCIUM 9.0  --     Lipid Panel:     Component Value Date/Time   CHOL 128 05/09/2015 0252   TRIG 79 05/09/2015 0252   HDL 41 05/09/2015 0252   CHOLHDL 3.1 05/09/2015 0252   VLDL 16 05/09/2015 0252   LDLCALC 71 05/09/2015 0252   HgbA1c: No results found for: HGBA1C Urine Drug Screen:     Component Value Date/Time   LABOPIA NONE DETECTED 05/09/2015 0621   COCAINSCRNUR NONE DETECTED 05/09/2015 0621   LABBENZ POSITIVE* 05/09/2015 0621   AMPHETMU NONE DETECTED 05/09/2015 0621   THCU NONE DETECTED 05/09/2015 0621   LABBARB NONE DETECTED 05/09/2015 0621      IMAGING  Mr Brain Wo Contrast  05/08/2015  CLINICAL DATA:  Stroke symptoms. Expressive aphasia. Blurred vision. Head pressure. Heaviness right leg  and arm. EXAM: MRI HEAD WITHOUT CONTRAST TECHNIQUE: Multiplanar, multiecho pulse sequences of the brain and surrounding structures were obtained without intravenous contrast. COMPARISON:  None. FINDINGS: Negative for acute infarct.  No significant chronic ischemic change Ventricle size normal.  Cerebral volume normal. Negative for intracranial hemorrhage. No fluid collection. Negative for mass or edema. No shift of the midline structures. Pituitary and skull base normal. Orbit normal. Paranasal sinuses clear. IMPRESSION: Normal Electronically Signed   By: Marlan Palau M.D.   On: 05/08/2015 21:14   Mr Maxine Glenn Head/brain Wo Cm  05/09/2015  CLINICAL DATA:  Follow-up brain MRI. Dizziness and right-sided tingling. EXAM: MRA HEAD WITHOUT CONTRAST TECHNIQUE: Angiographic images of the Circle of Willis were  obtained using MRA technique without intravenous contrast. COMPARISON:  Brain MRI from yesterday FINDINGS: Symmetric carotid arteries. Smooth and widely patent carotid siphons. Symmetric A1 and M1 segments which are widely patent. No major branch occlusion. No vessel beading. Negative for aneurysm when accounting for presumed right posterior communicating artery infundibulum. Symmetric vertebral arteries and vertebrobasilar branching. 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 No major branch occlusion, stenosis, or aneurysm. IMPRESSION: Negative MRA. Electronically Signed   By: Marnee Spring M.D.   On: 05/09/2015 08:23       PHYSICAL EXAM Pleasant obese middle aged african american lady not in distress. . Afebrile. Head is nontraumatic. Neck is supple without bruit.    Cardiac exam no murmur or gallop. Lungs are clear to auscultation. Distal pulses are well felt. Neurological Exam ;  Awake  Alert oriented x 3. Normal speech and language.eye movements full without nystagmus.fundi were not visualized. Vision acuity and fields appear normal. Hearing is normal. Palatal movements are normal. Face symmetric. Tongue midline. Normal strength, tone, reflexes and coordination. Normal sensation. Gait deferred. ASSESSMENT/PLAN [name] is a 45 y.o. female with history of anemia, anxiety, allergy presenting with right sided sensory deficit and dizziness. She did not receive IV t-PA.   Right hemisensory loss and deficit. Doubt stroke or TIA. Suspect underlying anxiety/panic  is source of symptoms.Possible complex partial seizure  Resultant  Neuro deficits resolved  MRI  Normal. No stroke  MRA  Unremarkable   Carotid Doppler  pending   2D Echo  pending (canceled 2D bubble)  EEG pending   LDL 71  HgbA1c pending  Lovenox 40 mg sq daily for VTE prophylaxis Diet Heart Room service appropriate?: Yes; Fluid consistency:: Thin  No antithrombotic prior to admission, now on aspirin 325  mg daily. No need to continue aspirin at discharge as this is not a stroke and patient has minimal cardiovascular risk factors.  Therapy recommendations:  No therapy needs  Disposition:  Return home  No neurologic follow-up indicated at this time  Other Stroke Risk Factors  ETOH use  Obesity, Body mass index is 30.14 kg/(m^2).   Other Active Problems  Anxiety, hx panic attacks   Hospital day #   Rhoderick Moody [facility] See Amion for Pager information 05/09/2015 10:48 AM  I have personally examined this patient, reviewed notes, independently viewed imaging studies, participated in medical decision making and plan of care. I have made any additions or clarifications directly to the above note. Agree with note above.  She presented with Transient multifocal symptoms of dizziness, disorientation, confusion and left hand tonic deviation of unclear etiology.MRI scan the brain which I personally reviewed is negative for acute infarct.  She does admit to significant underlying stress.Plan check EEG  For possible seizure  activity but unless it is significantly abnormal will hold off on  Anticonvulsants for a single episode with normal brain imaging.. She'll need outpatient follow-up in the neurology clinic if episodes recur.Greater than 50% of time during this 25 minute visit was spent on  Counseling patient and husband about her condition and coordination of care  Delia HeadyPramod Sethi, MD Medical Director [facility] Pager: [phone] 05/09/2015 3:08 PM    To contact Stroke Continuity provider, please refer to [email or web address]. After hours, contact General Neurology

## 2015-05-09 NOTE — Progress Notes (Signed)
PT Cancellation Note  Patient Details Name: Brittney Duran MRN: [id number] DOB: [birth year]   Cancelled Treatment:    Reason Eval/Treat Not Completed: PT screened, no needs identified, will sign off.  OT ambulated in hallway w/ pt and pt did well w/ high level balance activities.  PT is signing off.  Encarnacion ChuAshley Abashian PT, DPT  Pager: [phone] Phone: [phone] 05/09/2015, 2:01 PM

## 2015-05-09 NOTE — Progress Notes (Signed)
OT Cancellation Note  Patient Details Name: Brittney Duran MRN: [id number] DOB: 26-Nov-1970   Cancelled Treatment:    Reason Eval/Treat Not Completed: Patient at procedure or test/ unavailable;Other (comment) (Pt with active bedrest orders; RN notified). Per RN pt currently off the floor. Will return for OT eval as time allows and with updated activity orders.  Gaye AlkenBailey A Sedona Wenk M.S., OTR/L Pager: [phone]  05/09/2015, 12:19 PM

## 2015-05-09 NOTE — Evaluation (Signed)
Occupational Therapy Evaluation and Discharge Patient Details Name: Brittney Duran MRN: [id number] DOB: 1970/02/25 Today's Date: 05/09/2015    History of Present Illness 45 y.o. female with medical history significant of anemia, anxiety, allergy, who presents with slurred speech, or vision, left-sided heaviness and increased urinary frequency. MRI on 4/20 negative for acute infarct.   Clinical Impression   Pt reports she was independent with ADLs and mobility PTA. Currently pt is overall mod I to independent with ADLs and functional mobility. Pt able to perform higher level balance activities without unsteadiness or LOB. Educated pt and husband on signs/symptoms of stroke. Pt feels she has returned to baseline overall but currently feels "wore out". No further acute OT needs identified; signing off at this time. Please re-consult if needs change. Thank you for this referral.    Follow Up Recommendations  No OT follow up;Supervision - Intermittent    Equipment Recommendations  None recommended by OT    Recommendations for Other Services       Precautions / Restrictions Precautions Precautions: None Restrictions Weight Bearing Restrictions: No      Mobility Bed Mobility Overal bed mobility: Modified Independent                Transfers Overall transfer level: Independent Equipment used: None             General transfer comment: No LOB or unsteadiness noted.    Balance Overall balance assessment: Modified Independent                                          ADL Overall ADL's : Modified independent                                       General ADL Comments: Mod I for increased time. Performed parts of the DGI and higher level balance activities; no unsteadiness, dizziness or LOB noted. Educated pt and husband on signs and symptoms of stroke.      Vision Vision Assessment?: No apparent visual deficits   Perception      Praxis      Pertinent Vitals/Pain Pain Assessment: No/denies pain     Hand Dominance Right   Extremity/Trunk Assessment Upper Extremity Assessment Upper Extremity Assessment: Overall WFL for tasks assessed   Lower Extremity Assessment Lower Extremity Assessment: Overall WFL for tasks assessed   Cervical / Trunk Assessment Cervical / Trunk Assessment: Normal   Communication Communication Communication: No difficulties   Cognition Arousal/Alertness: Awake/alert Behavior During Therapy: WFL for tasks assessed/performed Overall Cognitive Status: Within Functional Limits for tasks assessed                     General Comments       Exercises       Shoulder Instructions      Home Living Family/patient expects to be discharged to:: Private residence Living Arrangements: Spouse/significant other Available Help at Discharge: Family;Available PRN/intermittently Type of Home: House       Home Layout: Two level;Able to live on main level with bedroom/bathroom     Bathroom Shower/Tub: Producer, television/film/video: Standard     Home Equipment: None          Prior Functioning/Environment Level of Independence: Independent  OT Diagnosis: Generalized weakness   OT Problem List:     OT Treatment/Interventions:      OT Goals(Current goals can be found in the care plan section) Acute Rehab OT Goals Patient Stated Goal: return home OT Goal Formulation: All assessment and education complete, DC therapy  OT Frequency:     Barriers to D/C:            Co-evaluation              End of Session Nurse Communication: Mobility status  Activity Tolerance: Patient tolerated treatment well Patient left: in chair;with call bell/phone within reach;with nursing/sitter in room;with family/visitor present   Time: 1334-1400 OT Time Calculation (min): 26 min Charges:  OT General Charges $OT Visit: 1 Procedure OT Evaluation $OT Eval Low  Complexity: 1 Procedure OT Treatments $Self Care/Home Management : 8-22 mins G-Codes: OT G-codes **NOT FOR INPATIENT CLASS** Functional Assessment Tool Used: Clinical judgement Functional Limitation: Self care Self Care Current Status (Z6109(G8987): 0 percent impaired, limited or restricted Self Care Goal Status (U0454(G8988): 0 percent impaired, limited or restricted Self Care Discharge Status (U9811(G8989): 0 percent impaired, limited or restricted   Gaye AlkenBailey A Tykeem Lanzer M.S., OTR/L Pager: [phone]: [phone]  05/09/2015, 2:08 PM

## 2015-05-09 NOTE — Progress Notes (Signed)
Interval History:                                                                                                                      Brittney Duran is an 45 y.o. female patient with right upper extremity spasms, admitted for further neurodiagnostic workup with a brain MRI and EEG study. Both of which have been negative. She does not have any symptoms at this time.   Past Medical History: Past Medical History  Diagnosis Date  . Hemorrhoids   . Anemia   . Environmental allergies   . Complication of anesthesia     pt reports "local" wears off quickly  . Leaky heart valve   . Anxiety     Panic attacks  . Wears contact lenses     Past Surgical History  Procedure Laterality Date  . Abdominal surgery    . Caesaran section  1989  . Colonoscopy with propofol N/A 10/07/2014    Procedure: COLONOSCOPY WITH PROPOFOL;  Surgeon: [name], MD;  Location: [facility];  Service: Endoscopy;  Laterality: N/A;  Latex    Family History: Family History  Problem Relation Age of Onset  . Hypertension Mother     Social History:   reports that she has never smoked. She has never used smokeless tobacco. She reports that she drinks alcohol. She reports that she does not use illicit drugs.  Allergies:  Allergies  Allergen Reactions  . Penicillins Other (See Comments)    Unknown reaction Has patient had a PCN reaction causing immediate rash, facial/tongue/throat swelling, SOB or lightheadedness with hypotension: YES Has patient had a PCN reaction causing severe rash involving mucus membranes or skin necrosis: NO Has patient had a PCN reaction that required hospitalizationNO Has patient had a PCN reaction occurring within the last 10 years: NO If all of the above answers are "NO", then may proceed with Cephalosporin use.  . Shellfish Allergy Swelling    lips  . Latex Rash     Medications:                                                                                                                          Current facility-administered medications:  .   stroke: mapping our early stages of recovery book, , Does not apply, Once, Lorretta Harp, MD .  0.9 %  sodium chloride infusion, , Intravenous, Continuous, Lorretta Harp, MD, Last Rate: 75 mL/hr at 05/09/15 0014 .  ALPRAZolam Prudy Feeler)  tablet 0.25 mg, 0.25 mg, Oral, QHS, Lorretta Harp, MD, 0.25 mg at 05/09/15 0013 .  aspirin suppository 300 mg, 300 mg, Rectal, Daily **OR** aspirin tablet 325 mg, 325 mg, Oral, Daily, Lorretta Harp, MD, 325 mg at 05/09/15 0959 .  cholecalciferol (VITAMIN D) tablet 5,000 Units, 5,000 Units, Oral, Daily, Lorretta Harp, MD, 5,000 Units at 05/09/15 [phone] .  diphenhydrAMINE (BENADRYL) capsule 25 mg, 25 mg, Oral, QHS, Lorretta Harp, MD, 25 mg at 05/09/15 0013 .  enoxaparin (LOVENOX) injection 40 mg, 40 mg, Subcutaneous, QHS, Lorretta Harp, MD, 40 mg at 05/09/15 0013 .  loratadine (CLARITIN) tablet 10 mg, 10 mg, Oral, Daily, Lorretta Harp, MD, 10 mg at 05/09/15 0959 .  senna-docusate (Senokot-S) tablet 1 tablet, 1 tablet, Oral, QHS PRN, Lorretta Harp, MD .  vitamin B-12 (CYANOCOBALAMIN) tablet 1,000 mcg, 1,000 mcg, Oral, Daily PRN, Lorretta Harp, MD   Neurologic Examination:                                                                                                     Today's Vitals   05/09/15 1059 05/09/15 1400 05/09/15 1800 05/09/15 2117  BP:  113/77 120/74 124/67  Pulse:  79 83 74  Temp:  98.1 F (36.7 C) 97.6 F (36.4 C) 99.3 F (37.4 C)  TempSrc:  Oral Oral Oral  Resp:  Height:      Weight:      SpO2:   100% 100%  PainSc: 0-No pain       Evaluation of higher integrative functions including: Level of alertness: Alert,  Oriented to time, place and person Speech: fluent, no evidence of dysarthria or aphasia noted.  Test the following cranial nerves: 2-12 grossly intact Motor examination: Normal tone, bulk, full 5/5 motor strength in all 4 extremities Examination of sensation : Normal and symmetric sensation to  pinprick in all 4 extremities and on face Examination of deep tendon reflexes: 2+, normal and symmetric in all extremities, normal plantars bilaterally Test coordination: Normal finger nose testing, with no evidence of limb appendicular ataxia or abnormal involuntary movements or tremors noted.     Lab Results: Basic Metabolic Panel:  Recent Labs Lab 05/08/15 1952 05/08/15 2000  NA 136 139  K 4.6 4.5  CL 108 106  CO2 18*  --   GLUCOSE 77 76  BUN 12 14  CREATININE 0.74 0.70  CALCIUM 9.0  --     Liver Function Tests:  Recent Labs Lab 05/08/15 1952  AST 31  ALT 16  ALKPHOS 41  BILITOT 0.6  PROT 6.0*  ALBUMIN 3.5   No results for input(s): LIPASE, AMYLASE in the last 168 hours. No results for input(s): AMMONIA in the last 168 hours.  CBC:  Recent Labs Lab 05/08/15 1952 05/08/15 2000  WBC 6.7  --   NEUTROABS 4.4  --   HGB 10.8* 12.2  HCT 33.8* 36.0  MCV 86.7  --   PLT 213  --     Cardiac Enzymes: No results for input(s): CKTOTAL, CKMB, CKMBINDEX, TROPONINI in the last 168 hours.  Lipid Panel:  Recent Labs Lab 05/09/15 0252  CHOL 128  TRIG 79  HDL 41  CHOLHDL 3.1  VLDL 16  LDLCALC 71    CBG:  Recent Labs Lab 05/09/15 0640 05/09/15 1300 05/09/15 1623  GLUCAP 91 78 88    Microbiology: No results found for this or any previous visit.  Imaging: Mr Sherrin DaisyBrain Wo Contrast  05/08/2015  CLINICAL DATA:  Stroke symptoms. Expressive aphasia. Blurred vision. Head pressure. Heaviness right leg and arm. EXAM: MRI HEAD WITHOUT CONTRAST TECHNIQUE: Multiplanar, multiecho pulse sequences of the brain and surrounding structures were obtained without intravenous contrast. COMPARISON:  None. FINDINGS: Negative for acute infarct.  No significant chronic ischemic change Ventricle size normal.  Cerebral volume normal. Negative for intracranial hemorrhage. No fluid collection. Negative for mass or edema. No shift of the midline structures. Pituitary and skull base  normal. Orbit normal. Paranasal sinuses clear. IMPRESSION: Normal Electronically Signed   By: Marlan Palauharles  Clark M.D.   On: 05/08/2015 21:14   Mr Maxine GlennMra Head/brain Wo Cm  05/09/2015  CLINICAL DATA:  Follow-up brain MRI. Dizziness and right-sided tingling. EXAM: MRA HEAD WITHOUT CONTRAST TECHNIQUE: Angiographic images of the Circle of Willis were obtained using MRA technique without intravenous contrast. COMPARISON:  Brain MRI from yesterday FINDINGS: Symmetric carotid arteries. Smooth and widely patent carotid siphons. Symmetric A1 and M1 segments which are widely patent. No major branch occlusion. No vessel beading. Negative for aneurysm when accounting for presumed right posterior communicating artery infundibulum. Symmetric vertebral arteries and vertebrobasilar branching. 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 No major branch occlusion, stenosis, or aneurysm. IMPRESSION: Negative MRA. Electronically Signed   By: Marnee SpringJonathon  Watts M.D.   On: 05/09/2015 08:23    Assessment and plan:   Brittney Duran is an 45 y.o. female patient with right upper extremity spasms, admitted for further neurodiagnostic workup with a brain MRI and EEG study. Both of which have been negative. She does not have any symptoms at this time. No further neurodiagnostic testing or any new medications recommended at this time. She can be discharged from neurology standpoint. If her symptoms recur in future, advised to follow-up with outpatient neurology. We'll sign off.

## 2015-05-09 NOTE — Progress Notes (Signed)
VASCULAR LAB PRELIMINARY  PRELIMINARY  PRELIMINARY  PRELIMINARY  Carotid duplex completed.    Preliminary report:  Bilateral:  1-39% ICA stenosis.  Vertebral artery flow is antegrade.     Jonetta Dagley, RVS 05/09/2015, 1:00 PM

## 2015-05-09 NOTE — Progress Notes (Signed)
SLP Cancellation Note  Patient Details Name: Brittney Duran MRN: [id number] DOB: October 31, 1970   Cancelled treatment:       Reason Eval/Treat Not Completed: SLP screened, no needs identified, will sign off. Pt reported having no residual events from the stroke-like event on 4/20. No signs of deficits in brief conversation. MRI/ MRA/ EEG were clear. SLP will sign off at this time.    Metro Kungle[place]ak, Amy K, MA, CCC-SLP 05/09/2015, 2:22 PM [phone]

## 2015-05-09 NOTE — Progress Notes (Signed)
Pt off the floor during 12:26 vital and neuro check.  Will continue to monitor. Sondra ComeSilva, Raeleigh Guinn M, RN

## 2015-05-10 DIAGNOSIS — F419 Anxiety disorder, unspecified: Secondary | ICD-10-CM | POA: Diagnosis not present

## 2015-05-10 DIAGNOSIS — D508 Other iron deficiency anemias: Secondary | ICD-10-CM

## 2015-05-10 DIAGNOSIS — G458 Other transient cerebral ischemic attacks and related syndromes: Secondary | ICD-10-CM | POA: Diagnosis not present

## 2015-05-10 LAB — CBC
HCT: 35.5 % — ABNORMAL LOW (ref 36.0–46.0)
Hemoglobin: 11.1 g/dL — ABNORMAL LOW (ref 12.0–15.0)
MCH: 27.6 pg (ref 26.0–34.0)
MCHC: 31.3 g/dL (ref 30.0–36.0)
MCV: 88.3 fL (ref 78.0–100.0)
Platelets: 226 10*3/uL (ref 150–400)
RBC: 4.02 MIL/uL (ref 3.87–5.11)
RDW: 14 % (ref 11.5–15.5)
WBC: 5.1 10*3/uL (ref 4.0–10.5)

## 2015-05-10 LAB — COMPREHENSIVE METABOLIC PANEL
ALT: 12 U/L — ABNORMAL LOW (ref 14–54)
AST: 16 U/L (ref 15–41)
Albumin: 3.2 g/dL — ABNORMAL LOW (ref 3.5–5.0)
Alkaline Phosphatase: 46 U/L (ref 38–126)
Anion gap: 9 (ref 5–15)
BUN: 8 mg/dL (ref 6–20)
CO2: 21 mmol/L — ABNORMAL LOW (ref 22–32)
Calcium: 8.8 mg/dL — ABNORMAL LOW (ref 8.9–10.3)
Chloride: 108 mmol/L (ref 101–111)
Creatinine, Ser: 0.76 mg/dL (ref 0.44–1.00)
GFR calc Af Amer: >60 mL/min (ref >60)
GFR calc non Af Amer: >60 mL/min (ref >60)
Glucose, Bld: 111 mg/dL — ABNORMAL HIGH (ref 65–99)
Potassium: 3.5 mmol/L (ref 3.5–5.1)
Sodium: 138 mmol/L (ref 135–145)
Total Bilirubin: 0.5 mg/dL (ref 0.3–1.2)
Total Protein: 6 g/dL — ABNORMAL LOW (ref 6.5–8.1)

## 2015-05-10 LAB — URINE CULTURE

## 2015-05-10 LAB — GLUCOSE, CAPILLARY
Glucose-Capillary: 102 mg/dL — ABNORMAL HIGH (ref 65–99)
Glucose-Capillary: 108 mg/dL — ABNORMAL HIGH (ref 65–99)

## 2015-05-10 LAB — CARDIOLIPIN ANTIBODIES, IGG, IGM, IGA
Anticardiolipin IgA: <9 APL U/mL (ref 0–11)
Anticardiolipin IgG: <9 GPL U/mL (ref 0–14)
Anticardiolipin IgM: <9 MPL U/mL (ref 0–12)

## 2015-05-10 LAB — BETA-2-GLYCOPROTEIN I ABS, IGG/M/A
Beta-2 Glyco I IgG: <9 GPI IgG units (ref 0–20)
Beta-2-Glycoprotein I IgA: <9 GPI IgA units (ref 0–25)
Beta-2-Glycoprotein I IgM: <9 GPI IgM units (ref 0–32)

## 2015-05-10 MED ORDER — DIAZEPAM 5 MG PO TABS: 2.5000 mg | ORAL_TABLET | Freq: Three times a day (TID) | ORAL | Status: DC | PRN

## 2015-05-10 NOTE — Progress Notes (Signed)
Patient is being d/c home. D/c instructions given and patient verbalized understanding. 

## 2015-05-10 NOTE — Discharge Summary (Signed)
Physician Discharge Summary  Brittney Duran MRN: [id number] DOB/AGE: July 10, 1970 45 y.o.  PCP: Lavera Guise, MD   Admit date: 05/08/2015 Discharge date: 05/10/2015  Discharge Diagnoses:   Principal Problem:   TIA (transient ischemic attack) Active Problems:   Anxiety   Anemia   UTI (urinary tract infection)   Seizures (HCC)   Muscle spasm    Follow-up recommendations Follow-up with PCP in 3-5 days , including all  additional recommended appointments as below Follow-up CBC, CMP in 3-5 days Patient recommended to cut back on her Valium      Current Discharge Medication List    CONTINUE these medications which have CHANGED   Details  diazepam (VALIUM) 5 MG tablet Take 0.5 tablets (2.5 mg total) by mouth every 8 (eight) hours as needed for anxiety. Qty: 1 tablet, Refills: 0      CONTINUE these medications which have NOT CHANGED   Details  ALPRAZolam (XANAX) 0.25 MG tablet Take 0.25 mg by mouth at bedtime. Take one every night per patient    cetirizine-pseudoephedrine (ZYRTEC-D) 5-120 MG per tablet Take 1 tablet by mouth 2 (two) times daily as needed for allergies.    Cholecalciferol (VITAMIN D3) 5000 units CAPS Take 1 capsule by mouth daily.    PARAGARD INTRAUTERINE COPPER IUD IUD 1 each by Intrauterine route once.    vitamin B-12 (CYANOCOBALAMIN) 1000 MCG tablet Take 1,000 mcg by mouth daily as needed (energy). Reported on 05/08/2015      STOP taking these medications     diphenhydrAMINE (BENADRYL) 25 MG tablet          Discharge Condition: Stable  Discharge Instructions Get Medicines reviewed and adjusted: Please take all your medications with you for your next visit with your Primary MD  Please request your Primary MD to go over all hospital tests and procedure/radiological results at the follow up, please ask your Primary MD to get all Hospital records sent to his/her office.  If you experience worsening of your admission symptoms, develop shortness  of breath, life threatening emergency, suicidal or homicidal thoughts you must seek medical attention immediately by calling 911 or calling your MD immediately if symptoms less severe.  You must read complete instructions/literature along with all the possible adverse reactions/side effects for all the Medicines you take and that have been prescribed to you. Take any new Medicines after you have completely understood and accpet all the possible adverse reactions/side effects.   Do not drive when taking Pain medications.   Do not take more than prescribed Pain, Sleep and Anxiety Medications  Special Instructions: If you have smoked or chewed Tobacco in the last 2 yrs please stop smoking, stop any regular Alcohol and or any Recreational drug use.  Wear Seat belts while driving.  Please note  You were cared for by a hospitalist during your hospital stay. Once you are discharged, your primary care physician will handle any further medical issues. Please note that NO REFILLS for any discharge medications will be authorized once you are discharged, as it is imperative that you return to your primary care physician (or establish a relationship with a primary care physician if you do not have one) for your aftercare needs so that they can reassess your need for medications and monitor your lab values.  Discharge Instructions    Diet - low sodium heart healthy    Complete by:  As directed      Increase activity slowly    Complete by:  As  directed             Allergies  Allergen Reactions  . Penicillins Other (See Comments)    Unknown reaction Has patient had a PCN reaction causing immediate rash, facial/tongue/throat swelling, SOB or lightheadedness with hypotension: YES Has patient had a PCN reaction causing severe rash involving mucus membranes or skin necrosis: NO Has patient had a PCN reaction that required hospitalizationNO Has patient had a PCN reaction occurring within the last 10  years: NO If all of the above answers are "NO", then may proceed with Cephalosporin use.  . Shellfish Allergy Swelling    lips  . Latex Rash      Disposition: 01-Home or Self Care   Consults: Neurology    Significant Diagnostic Studies:  Mr Brain Wo Contrast  05/08/2015  CLINICAL DATA:  Stroke symptoms. Expressive aphasia. Blurred vision. Head pressure. Heaviness right leg and arm. EXAM: MRI HEAD WITHOUT CONTRAST TECHNIQUE: Multiplanar, multiecho pulse sequences of the brain and surrounding structures were obtained without intravenous contrast. COMPARISON:  None. FINDINGS: Negative for acute infarct.  No significant chronic ischemic change Ventricle size normal.  Cerebral volume normal. Negative for intracranial hemorrhage. No fluid collection. Negative for mass or edema. No shift of the midline structures. Pituitary and skull base normal. Orbit normal. Paranasal sinuses clear. IMPRESSION: Normal Electronically Signed   By: Franchot Gallo M.D.   On: 05/08/2015 21:14   Mr [name] Head/brain Wo Cm  05/09/2015  CLINICAL DATA:  Follow-up brain MRI. Dizziness and right-sided tingling. EXAM: MRA HEAD WITHOUT CONTRAST TECHNIQUE: Angiographic images of the Circle of Willis were obtained using MRA technique without intravenous contrast. COMPARISON:  Brain MRI from yesterday FINDINGS: Symmetric carotid arteries. Smooth and widely patent carotid siphons. Symmetric A1 and M1 segments which are widely patent. No major branch occlusion. No vessel beading. Negative for aneurysm when accounting for presumed right posterior communicating artery infundibulum. Symmetric vertebral arteries and vertebrobasilar branching. 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 No major branch occlusion, stenosis, or aneurysm. IMPRESSION: Negative MRA. Electronically Signed   By: Monte Fantasia M.D.   On: 05/09/2015 08:23        Filed Weights   05/08/15 2350  Weight: 87.317 kg (192 lb 8 oz)      Microbiology: Recent Results (from the past 240 hour(s))  Urine culture     Status: None   Collection Time: 05/09/15  6:21 AM  Result Value Ref Range Status   Specimen Description URINE, RANDOM  Final   Special Requests NONE  Final   Culture MULTIPLE SPECIES PRESENT, SUGGEST RECOLLECTION  Final   Report Status 05/10/2015 FINAL  Final       Blood Culture    Component Value Date/Time   SDES URINE, RANDOM 05/09/2015 0621   SPECREQUEST NONE 05/09/2015 0621   CULT MULTIPLE SPECIES PRESENT, SUGGEST RECOLLECTION 05/09/2015 0621   REPTSTATUS 05/10/2015 FINAL 05/09/2015 8416      Labs: Results for orders placed or performed during the hospital encounter of 05/08/15 (from the past 48 hour(s))  Protime-INR     Status: None   Collection Time: 05/08/15  7:52 PM  Result Value Ref Range   Prothrombin Time 14.1 11.6 - 15.2 seconds   INR 1.08 0.00 - 1.49  APTT     Status: None   Collection Time: 05/08/15  7:52 PM  Result Value Ref Range   aPTT 32 24 - 37 seconds  CBC     Status: Abnormal   Collection Time: 05/08/15  7:52 PM  Result Value Ref Range   WBC 6.7 4.0 - 10.5 K/uL   RBC 3.90 3.87 - 5.11 MIL/uL   Hemoglobin 10.8 (L) 12.0 - 15.0 g/dL   HCT 33.8 (L) 36.0 - 46.0 %   MCV 86.7 78.0 - 100.0 fL   MCH 27.7 26.0 - 34.0 pg   MCHC 32.0 30.0 - 36.0 g/dL   RDW 13.9 11.5 - 15.5 %   Platelets 213 150 - 400 K/uL  Differential     Status: None   Collection Time: 05/08/15  7:52 PM  Result Value Ref Range   Neutrophils Relative % 65 %   Neutro Abs 4.4 1.7 - 7.7 K/uL   Lymphocytes Relative 26 %   Lymphs Abs 1.7 0.7 - 4.0 K/uL   Monocytes Relative 6 %   Monocytes Absolute 0.4 0.1 - 1.0 K/uL   Eosinophils Relative 3 %   Eosinophils Absolute 0.2 0.0 - 0.7 K/uL   Basophils Relative 0 %   Basophils Absolute 0.0 0.0 - 0.1 K/uL  Comprehensive metabolic panel     Status: Abnormal   Collection Time: 05/08/15  7:52 PM  Result Value Ref Range   Sodium 136 135 - 145 mmol/L   Potassium  4.6 3.5 - 5.1 mmol/L   Chloride 108 101 - 111 mmol/L   CO2 18 (L) 22 - 32 mmol/L   Glucose, Bld 77 65 - 99 mg/dL   BUN 12 6 - 20 mg/dL   Creatinine, Ser 0.74 0.44 - 1.00 mg/dL   Calcium 9.0 8.9 - 10.3 mg/dL   Total Protein 6.0 (L) 6.5 - 8.1 g/dL   Albumin 3.5 3.5 - 5.0 g/dL   AST 31 15 - 41 U/L   ALT 16 14 - 54 U/L   Alkaline Phosphatase 41 38 - 126 U/L   Total Bilirubin 0.6 0.3 - 1.2 mg/dL   GFR calc non Af Amer >60 >60 mL/min   GFR calc Af Amer >60 >60 mL/min    Comment: (NOTE) The eGFR has been calculated using the CKD EPI equation. This calculation has not been validated in all clinical situations. eGFR's persistently <60 mL/min signify possible Chronic Kidney Disease.    Anion gap 10 5 - 15  I-stat troponin, ED (not at [facility], [facility])     Status: None   Collection Time: 05/08/15  7:58 PM  Result Value Ref Range   Troponin i, poc 0.00 0.00 - 0.08 ng/mL   Comment 3            Comment: Due to the release kinetics of cTnI, a negative result within the first hours of the onset of symptoms does not rule out myocardial infarction with certainty. If myocardial infarction is still suspected, repeat the test at appropriate intervals.   I-Stat Chem 8, ED  (not at [facility], [facility])     Status: Abnormal   Collection Time: 05/08/15  8:00 PM  Result Value Ref Range   Sodium 139 135 - 145 mmol/L   Potassium 4.5 3.5 - 5.1 mmol/L   Chloride 106 101 - 111 mmol/L   BUN 14 6 - 20 mg/dL   Creatinine, Ser 0.70 0.44 - 1.00 mg/dL   Glucose, Bld 76 65 - 99 mg/dL   Calcium, Ion 1.11 (L) 1.12 - 1.23 mmol/L   TCO2 21 0 - 100 mmol/L   Hemoglobin 12.2 12.0 - 15.0 g/dL   HCT 36.0 36.0 - 46.0 %  Urinalysis, Routine w reflex microscopic (not at [facility])  Status: Abnormal   Collection Time: 05/08/15  9:06 PM  Result Value Ref Range   Color, Urine RED (A) YELLOW    Comment: BIOCHEMICALS MAY BE AFFECTED BY COLOR   APPearance CLOUDY (A) CLEAR   Specific Gravity, Urine 1.009 1.005 - 1.030   pH 6.5 5.0 - 8.0    Glucose, UA NEGATIVE NEGATIVE mg/dL   Hgb urine dipstick LARGE (A) NEGATIVE   Bilirubin Urine NEGATIVE NEGATIVE   Ketones, ur NEGATIVE NEGATIVE mg/dL   Protein, ur NEGATIVE NEGATIVE mg/dL   Nitrite NEGATIVE NEGATIVE   Leukocytes, UA MODERATE (A) NEGATIVE  Urine microscopic-add on     Status: Abnormal   Collection Time: 05/08/15  9:06 PM  Result Value Ref Range   Squamous Epithelial / LPF 0-5 (A) NONE SEEN   WBC, UA 6-30 0 - 5 WBC/hpf   RBC / HPF TOO NUMEROUS TO COUNT 0 - 5 RBC/hpf   Bacteria, UA FEW (A) NONE SEEN  Sedimentation rate     Status: None   Collection Time: 05/09/15 12:13 AM  Result Value Ref Range   Sed Rate 7 0 - 22 mm/hr  C-reactive protein     Status: None   Collection Time: 05/09/15 12:13 AM  Result Value Ref Range   CRP <0.5 <1.0 mg/dL  Antithrombin III     Status: None   Collection Time: 05/09/15 12:13 AM  Result Value Ref Range   AntiThromb III Func 99 75 - 120 %  HIV antibody     Status: None   Collection Time: 05/09/15 12:13 AM  Result Value Ref Range   HIV Screen 4th Generation wRfx Non Reactive Non Reactive    Comment: (NOTE) Performed At: [facility] [place], [place] [id number] Lindon Romp MD [id number]   Lipid panel     Status: None   Collection Time: 05/09/15  2:52 AM  Result Value Ref Range   Cholesterol 128 0 - 200 mg/dL   Triglycerides 79 <150 mg/dL   HDL 41 >40 mg/dL   Total CHOL/HDL Ratio 3.1 RATIO   VLDL 16 0 - 40 mg/dL   LDL Cholesterol 71 0 - 99 mg/dL    Comment:        Total Cholesterol/HDL:CHD Risk Coronary Heart Disease Risk Table                     Men   Women  1/2 Average Risk   3.4   3.3  Average Risk       5.0   4.4  2 X Average Risk   9.6   7.1  3 X Average Risk  23.4   11.0        Use the calculated Patient Ratio above and the CHD Risk Table to determine the patient's CHD Risk.        ATP III CLASSIFICATION (LDL):  <100     mg/dL   Optimal  100-129  mg/dL   Near or Above                     Optimal  130-159  mg/dL   Borderline  160-189  mg/dL   High  >190     mg/dL   Very High   Urine culture     Status: None   Collection Time: 05/09/15  6:21 AM  Result Value Ref Range   Specimen Description URINE, RANDOM    Special Requests NONE    Culture MULTIPLE  SPECIES PRESENT, SUGGEST RECOLLECTION    Report Status 05/10/2015 FINAL   Urine rapid drug screen (hosp performed)     Status: Abnormal   Collection Time: 05/09/15  6:21 AM  Result Value Ref Range   Opiates NONE DETECTED NONE DETECTED   Cocaine NONE DETECTED NONE DETECTED   Benzodiazepines POSITIVE (A) NONE DETECTED   Amphetamines NONE DETECTED NONE DETECTED   Tetrahydrocannabinol NONE DETECTED NONE DETECTED   Barbiturates NONE DETECTED NONE DETECTED    Comment:        DRUG SCREEN FOR MEDICAL PURPOSES ONLY.  IF CONFIRMATION IS NEEDED FOR ANY PURPOSE, NOTIFY LAB WITHIN 5 DAYS.        LOWEST DETECTABLE LIMITS FOR URINE DRUG SCREEN Drug Class       Cutoff (ng/mL) Amphetamine      1000 Barbiturate      200 Benzodiazepine   379 Tricyclics       024 Opiates          300 Cocaine          300 THC              50   Glucose, capillary     Status: None   Collection Time: 05/09/15  6:40 AM  Result Value Ref Range   Glucose-Capillary 91 65 - 99 mg/dL   Comment 1 Notify RN    Comment 2 Document in Chart   Glucose, capillary     Status: None   Collection Time: 05/09/15  1:00 PM  Result Value Ref Range   Glucose-Capillary 78 65 - 99 mg/dL  Glucose, capillary     Status: None   Collection Time: 05/09/15  4:23 PM  Result Value Ref Range   Glucose-Capillary 88 65 - 99 mg/dL  Glucose, capillary     Status: None   Collection Time: 05/09/15  9:15 PM  Result Value Ref Range   Glucose-Capillary 94 65 - 99 mg/dL   Comment 1 Notify RN    Comment 2 Document in Chart   CBC     Status: Abnormal   Collection Time: 05/10/15  3:26 AM  Result Value Ref Range   WBC 5.1 4.0 - 10.5 K/uL   RBC 4.02 3.87 - 5.11 MIL/uL    Hemoglobin 11.1 (L) 12.0 - 15.0 g/dL   HCT 35.5 (L) 36.0 - 46.0 %   MCV 88.3 78.0 - 100.0 fL   MCH 27.6 26.0 - 34.0 pg   MCHC 31.3 30.0 - 36.0 g/dL   RDW 14.0 11.5 - 15.5 %   Platelets 226 150 - 400 K/uL  Comprehensive metabolic panel     Status: Abnormal   Collection Time: 05/10/15  3:26 AM  Result Value Ref Range   Sodium 138 135 - 145 mmol/L   Potassium 3.5 3.5 - 5.1 mmol/L    Comment: DELTA CHECK NOTED   Chloride 108 101 - 111 mmol/L   CO2 21 (L) 22 - 32 mmol/L   Glucose, Bld 111 (H) 65 - 99 mg/dL   BUN 8 6 - 20 mg/dL   Creatinine, Ser 0.76 0.44 - 1.00 mg/dL   Calcium 8.8 (L) 8.9 - 10.3 mg/dL   Total Protein 6.0 (L) 6.5 - 8.1 g/dL   Albumin 3.2 (L) 3.5 - 5.0 g/dL   AST 16 15 - 41 U/L   ALT 12 (L) 14 - 54 U/L   Alkaline Phosphatase 46 38 - 126 U/L   Total Bilirubin 0.5 0.3 - 1.2 mg/dL   GFR  calc non Af Amer >60 >60 mL/min   GFR calc Af Amer >60 >60 mL/min    Comment: (NOTE) The eGFR has been calculated using the CKD EPI equation. This calculation has not been validated in all clinical situations. eGFR's persistently <60 mL/min signify possible Chronic Kidney Disease.    Anion gap 9 5 - 15  Glucose, capillary     Status: Abnormal   Collection Time: 05/10/15  6:09 AM  Result Value Ref Range   Glucose-Capillary 102 (H) 65 - 99 mg/dL   Comment 1 Notify RN    Comment 2 Document in Chart   Glucose, capillary     Status: Abnormal   Collection Time: 05/10/15 11:53 AM  Result Value Ref Range   Glucose-Capillary 108 (H) 65 - 99 mg/dL     Lipid Panel     Component Value Date/Time   CHOL 128 05/09/2015 0252   TRIG 79 05/09/2015 0252   HDL 41 05/09/2015 0252   CHOLHDL 3.1 05/09/2015 0252   VLDL 16 05/09/2015 0252   LDLCALC 71 05/09/2015 0252     No results found for: HGBA1C   Lab Results  Component Value Date   LDLCALC 71 05/09/2015   CREATININE 0.76 05/10/2015     45 y.o. female with medical history significant of anemia, anxiety, allergy, who presents with  slurred speech, or vision, left-sided heaviness and increased urinary frequency.Patient reports that she started having start speech, blurry vision in both eyes, and left-sided heaviness at about 4 PM. She also had dizziness and lightheadedness. She states that her rigth arm suddenly became flexed at the wrist and stayed that way for about 20 minutes. It went back to normal but her right hand was somewhat weak afterwards. She also reports that her memory failed her for a few moments.Patient does not have chest pain, abdominal pain, nausea, vomiting, diarrhea. Marland Kitchen MRI of her brain is negative for acute intracranial abnormalities. Patient is admitted to inpatient for further evaluation.  Assessment and plan TIA (transient ischemic attack) vs seizure: Patient symptoms are concerning for TIA. She also has component of possible seizure activity. Neurology was consulted. Dr. Wendee Beavers saw pt, and recommended EEG and brief vascular workup before discharge.Continue telemetry - Appreciate Dr. Jorge Mandril consultation, the follow-up recommendations - EEG within normal limits - Risk factor modification: HgbA1c, fasting lipid panel and UDS - MRA of the brain without contrast negative, MRA negative - PT consult, OT consult, Speech consult within normal limits - 2 d Echocardiogram pending - Carotid dopplers Bilateral: 1-39% ICA stenosis - Aspirin 325 milligrams a day  crp, ESR, HIV antibody, ANA and hypercoagulable panel-negative  Anxiety: Patient takes large doses of Valium and home, recommended that the patient needs to cut back on This to half the dose   Possible UTI: Patient has a increased urinary frequency, but no dysuria or burning on urination. Urinalysis is positive with moderate amount of leukocytes. Started on ciprofloxacin but urine culture shows multiple morphologies, suggesting contamination therefore antibiotic has been discontinued     Discharge Exam:    Blood pressure 112/64, pulse 67,  temperature 98.4 F (36.9 C), temperature source Oral, resp. rate 18, height _0  (1.702 m), weight 87.317 kg (192 lb 8 oz), last menstrual period 05/08/2015, SpO2 99 %.      Follow-up Information    Follow up with Lavera Guise, MD. Schedule an appointment as soon as possible for a visit in 3 days.   Specialty:  Internal Medicine   Contact information:  [address] [place] [address] [phone]       Signed: Reyne Dumas 05/10/2015, 1:51 PM        Time spent >45 mins

## 2015-05-11 LAB — PROTEIN S ACTIVITY: Protein S Activity: 72 % (ref 63–140)

## 2015-05-11 LAB — PROTEIN C ACTIVITY: Protein C Activity: 128 % (ref 73–180)

## 2015-05-11 LAB — PROTEIN C, TOTAL: Protein C, Total: 96 % (ref 60–150)

## 2015-05-11 LAB — PROTEIN S, TOTAL: Protein S Ag, Total: 85 % (ref 60–150)

## 2015-05-12 LAB — HEMOGLOBIN A1C
Hgb A1c MFr Bld: 5.4 % (ref 4.8–5.6)
Mean Plasma Glucose: 108 mg/dL

## 2015-05-12 LAB — LUPUS ANTICOAGULANT PANEL
DRVVT: 37.3 s (ref 0.0–44.0)
PTT Lupus Anticoagulant: 38.4 s (ref 0.0–43.6)

## 2015-05-12 LAB — HOMOCYSTEINE: Homocysteine: 7.1 umol/L (ref 0.0–15.0)

## 2015-05-12 LAB — ANTINUCLEAR ANTIBODIES, IFA: ANA Ab, IFA: NEGATIVE

## 2015-05-19 LAB — FACTOR 5 LEIDEN

## 2015-05-19 LAB — PROTHROMBIN GENE MUTATION

## 2015-05-27 ENCOUNTER — Other Ambulatory Visit: Payer: Self-pay | Admitting: Nurse Practitioner

## 2015-05-27 DIAGNOSIS — R0602 Shortness of breath: Secondary | ICD-10-CM

## 2015-06-04 ENCOUNTER — Ambulatory Visit: Admission: RE | Admit: 2015-06-04 | Payer: Managed Care, Other (non HMO) | Source: Ambulatory Visit

## 2015-06-05 ENCOUNTER — Encounter: Payer: Self-pay | Admitting: Neurology

## 2015-06-05 ENCOUNTER — Ambulatory Visit (INDEPENDENT_AMBULATORY_CARE_PROVIDER_SITE_OTHER): Payer: 59 | Admitting: Neurology

## 2015-06-05 ENCOUNTER — Ambulatory Visit
Admission: RE | Admit: 2015-06-05 | Discharge: 2015-06-05 | Disposition: A | Discharge status: Home / Self Care | Payer: 59 | Source: Ambulatory Visit | Attending: Nurse Practitioner | Admitting: Nurse Practitioner

## 2015-06-05 VITALS — BP 114/62 | HR 78 | Ht 67.0 in | Wt 203.5 lb

## 2015-06-05 DIAGNOSIS — G458 Other transient cerebral ischemic attacks and related syndromes: Secondary | ICD-10-CM | POA: Diagnosis not present

## 2015-06-05 DIAGNOSIS — R0602 Shortness of breath: Secondary | ICD-10-CM

## 2015-06-05 NOTE — Progress Notes (Signed)
Reason for visit: Possible TIA  Referring physician: [facility]  Brittney Duran is a 45 y.o. female  History of present illness:  Brittney Duran is a 45 year old left-handed black female with a history of an admission to the hospital around 05/08/2015. The patient was at work that day, she started feeling somewhat tingly on the right side of the face, and then the tingling went to the left side of the face. The patient began to feel somewhat dizzy, and she was somewhat short winded. When she tried to walk, she felt staggery. She noted that she was having some difficulty with talking, she was having problems understanding what she was reading. She began having some visual blurring. She decided to go home early from work, she was able to drive herself home. When she got home, she essentially had visual loss, she had some problems with involuntary flexion of the right arm and inability to speak. She began having a headache at that time. The headache was all over the head. The patient felt some numbness of the right arm and the right leg felt heavy. She went to the hospital by EMS, within 3 hours from onset of her initial deficits, she began to feel normal. Workup in the hospital included MRI of the brain, MRA of the head, EEG study, and carotid Doppler study. These studies were all normal. The patient has undergone a 2-D echocardiogram following discharge, the results are still pending. The patient had a hypercoagulable state workup, this was unremarkable. She has been placed on aspirin taking 162 mg of aspirin daily. She has not had any recurrence of these symptoms. She indicates that she does not really have frequent headaches. There is no family history of migraine. The patient is sent to this office for an evaluation.  Past Medical History  Diagnosis Date  . Hemorrhoids   . Anemia   . Environmental allergies   . Complication of anesthesia     pt reports "local" wears off quickly  . Leaky heart  valve   . Anxiety     Panic attacks  . Wears contact lenses   . Depression     Past Surgical History  Procedure Laterality Date  . Hernia repair  1978  . Caesaran section  1989  . Colonoscopy with propofol N/A 10/07/2014    Procedure: COLONOSCOPY WITH PROPOFOL;  Surgeon: [name], MD;  Location: [facility];  Service: Endoscopy;  Laterality: N/A;  Latex    Family History  Problem Relation Age of Onset  . Hypertension Mother   . Pancreatic cancer    . Seizures Brother     Social history:  reports that she has quit smoking. She has never used smokeless tobacco. She reports that she drinks alcohol. She reports that she does not use illicit drugs.  Medications:  Prior to Admission medications   Medication Sig Start Date End Date Taking? Authorizing Provider  ALPRAZolam Prudy Feeler) 0.5 MG tablet  05/12/15  Yes Historical Provider, MD  cetirizine-pseudoephedrine (ZYRTEC-D) 5-120 MG per tablet Take 1 tablet by mouth 2 (two) times daily as needed for allergies.   Yes Historical Provider, MD  Cholecalciferol (VITAMIN D3) 5000 units CAPS Take 1 capsule by mouth daily.   Yes Historical Provider, MD  FLUoxetine (PROZAC) 20 MG tablet  04/28/15  Yes Historical Provider, MD  PARAGARD INTRAUTERINE COPPER IUD IUD 1 each by Intrauterine route once.   Yes Historical Provider, MD  vitamin B-12 (CYANOCOBALAMIN) 1000 MCG tablet Take 1,000  mcg by mouth daily as needed (energy). Reported on 05/08/2015   Yes Historical Provider, MD      Allergies  Allergen Reactions  . Penicillins Other (See Comments)    Unknown reaction Has patient had a PCN reaction causing immediate rash, facial/tongue/throat swelling, SOB or lightheadedness with hypotension: YES Has patient had a PCN reaction causing severe rash involving mucus membranes or skin necrosis: NO Has patient had a PCN reaction that required hospitalizationNO Has patient had a PCN reaction occurring within the last 10 years: NO If all of the above  answers are "NO", then may proceed with Cephalosporin use.  . Shellfish Allergy Swelling    lips  . Latex Rash    ROS:  Out of a complete 14 system review of symptoms, the patient complains only of the following symptoms, and all other reviewed systems are negative.  Moles Allergy Seizure Depression, anxiety, racing thoughts Sleepiness  Blood pressure 114/62, pulse 78, height 5\' 7"  (1.702 m), weight 203 lb 8 oz (92.307 kg), last menstrual period 05/08/2015.  Physical Exam  General: The patient is alert and cooperative at the time of the examination.  Eyes: Pupils are equal, round, and reactive to light. Discs are flat bilaterally.  Neck: The neck is supple, no carotid bruits are noted.  Respiratory: The respiratory examination is clear.  Cardiovascular: The cardiovascular examination reveals a regular rate and rhythm, no obvious murmurs or rubs are noted.  Skin: Extremities are without significant edema.  Neurologic Exam  Mental status: The patient is alert and oriented x 3 at the time of the examination. The patient has apparent normal recent and remote memory, with an apparently normal attention span and concentration ability.  Cranial nerves: Facial symmetry is present. There is good sensation of the face to pinprick and soft touch bilaterally. The strength of the facial muscles and the muscles to head turning and shoulder shrug are normal bilaterally. Speech is well enunciated, no aphasia or dysarthria is noted. Extraocular movements are full. Visual fields are full. The tongue is midline, and the patient has symmetric elevation of the soft palate. No obvious hearing deficits are noted.  Motor: The motor testing reveals 5 over 5 strength of all 4 extremities. Good symmetric motor tone is noted throughout.  Sensory: Sensory testing is intact to pinprick, soft touch, vibration sensation, and position sense on all 4 extremities. No evidence of extinction is  noted.  Coordination: Cerebellar testing reveals good finger-nose-finger and heel-to-shin bilaterally.  Gait and station: Gait is normal. Tandem gait is normal. Romberg is negative. No drift is seen.  Reflexes: Deep tendon reflexes are symmetric and normal bilaterally. Toes are downgoing bilaterally.   MRI brain 05/09/15:  IMPRESSION: Normal  * MRI scan images were reviewed online. I agree with the written report.    Assessment/Plan:  1. Episode of right-sided weakness, numbness, speech disturbance, and bilateral visual disturbance  2. Headache  The workup done in the hospital was unremarkable. The patient has no residual deficits from the event described above. The patient was symptomatic for about 3 hours, MRI the brain was completely normal. I think that a true TIA or stroke event is unlikely. 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 She has been under some stress with work. The patient will remain on aspirin, we will follow the patient conservatively. If the headaches or the numbness and weakness episodes recur, she is to contact our office.    Marlan Palau. Keith Tomi Grandpre MD 06/05/2015 8:29 PM  [facility] [address] [place], [place] [address]  Phone [phone] Fax [phone]

## 2015-06-05 NOTE — Progress Notes (Signed)
*  PRELIMINARY RESULTS* Echocardiogram 2D Echocardiogram has been performed.  Georgann HousekeeperJerry R Hege 06/05/2015, 11:31 AM

## 2015-07-11 ENCOUNTER — Telehealth: Payer: Self-pay | Admitting: Neurology

## 2015-07-11 NOTE — Telephone Encounter (Signed)
Pt called said she is wanting to know why Dr Anne HahnWillis thinks she did not have a TIA when "all the other Dr's " have told her differently. She is requesting a call back. She said if this information is false it needs to be corrected. This patient is adamant about knowing the correct findings.

## 2015-07-11 NOTE — Telephone Encounter (Signed)
I called the patient. I discussed the issues with the terminology of TIA versus a migrainous event. The patient had strokelike symptoms that lasted for greater than 3 hours, yet the MRI of the brain was unremarkable. Statistically speaking, neurologic events lasting greater than one hour that are vascular in nature will usually result in ischemic changes seen by MRI. The lack of ischemic changes with a long duration event makes it less likely that this was a true TIA, the question would be whether this was a migrainous event. The patient still needs to be on antiplatelet agents for this, as neurologic migraine does increase her risk for stroke in the future.

## 2015-08-12 ENCOUNTER — Telehealth: Payer: Self-pay | Admitting: *Deleted

## 2015-08-12 NOTE — Telephone Encounter (Signed)
Pt medical records faxed to [facility] on 08/12/2015.

## 2015-08-20 ENCOUNTER — Encounter: Payer: Self-pay | Admitting: Licensed Clinical Social Worker

## 2015-08-20 ENCOUNTER — Ambulatory Visit (INDEPENDENT_AMBULATORY_CARE_PROVIDER_SITE_OTHER): Payer: 59 | Admitting: Licensed Clinical Social Worker

## 2015-08-20 ENCOUNTER — Ambulatory Visit (INDEPENDENT_AMBULATORY_CARE_PROVIDER_SITE_OTHER): Payer: 59 | Admitting: Psychiatry

## 2015-08-20 DIAGNOSIS — F331 Major depressive disorder, recurrent, moderate: Secondary | ICD-10-CM | POA: Diagnosis not present

## 2015-08-20 DIAGNOSIS — F4001 Agoraphobia with panic disorder: Secondary | ICD-10-CM

## 2015-08-20 MED ORDER — VENLAFAXINE HCL ER 75 MG PO CP24: 75.0000 mg | ORAL_CAPSULE | Freq: Every day | ORAL | 0 refills | Status: DC

## 2015-08-20 MED ORDER — QUETIAPINE FUMARATE 50 MG PO TABS: 50.0000 mg | ORAL_TABLET | Freq: Every day | ORAL | 0 refills | Status: DC
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
## 2015-09-01 ENCOUNTER — Ambulatory Visit (INDEPENDENT_AMBULATORY_CARE_PROVIDER_SITE_OTHER): Payer: 59 | Admitting: Licensed Clinical Social Worker

## 2015-09-01 ENCOUNTER — Ambulatory Visit (INDEPENDENT_AMBULATORY_CARE_PROVIDER_SITE_OTHER): Payer: 59 | Admitting: Psychiatry

## 2015-09-01 ENCOUNTER — Encounter: Payer: Self-pay | Admitting: Psychiatry

## 2015-09-01 VITALS — BP 115/75 | HR 70 | Temp 98.5°F | Ht 67.0 in | Wt 203.0 lb

## 2015-09-01 DIAGNOSIS — F4001 Agoraphobia with panic disorder: Secondary | ICD-10-CM | POA: Diagnosis not present

## 2015-09-01 DIAGNOSIS — F331 Major depressive disorder, recurrent, moderate: Secondary | ICD-10-CM | POA: Diagnosis not present

## 2015-09-01 MED ORDER — QUETIAPINE FUMARATE 50 MG PO TABS: 50.0000 mg | ORAL_TABLET | Freq: Every day | ORAL | 0 refills | Status: DC

## 2015-09-01 MED ORDER — FLUOXETINE HCL 10 MG PO CAPS: 10.0000 mg | ORAL_CAPSULE | Freq: Every day | ORAL | 0 refills | Status: DC
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
## 2015-09-02 DIAGNOSIS — Z0289 Encounter for other administrative examinations: Secondary | ICD-10-CM

## 2015-09-16 ENCOUNTER — Telehealth: Payer: Self-pay | Admitting: Psychiatry

## 2015-09-16 NOTE — Progress Notes (Signed)
   THERAPIST PROGRESS NOTE  Session Time: 60min  Participation Level: Active  Behavioral Response: Neat and Well GroomedAlertDepressed  Type of Therapy: Individual Therapy  Treatment Goals addressed: Coping and Diagnosis: Depression  Interventions: Motivational Interviewing, Solution Focused, Strength-based, Supportive and Reframing  Summary: Brittney MassedKiawana D Duran is a 45 y.o. female who presents with symptoms of her depression.  LCSW discussed what psychotherapy is and is not and the importance of the therapeutic relationship to include open and honest communication between client and therapist and building trust.  Reviewed advantages and disadvantages of the therapeutic process and limitations to the therapeutic relationship including LCSW's role in maintaining the safety of the client, others and those in client's care.    Suicidal/Homicidal: Nowithout intent/plan  Therapist Response: Therapist actively worked on building the level of trust with the Patient through consistent eye contact, active listening, unconditional positive regard and warm acceptance. Therapist discussed expectations of therapy and allowed Patient time to discuss her thoughts and concerns about attending therapy.  Therapist assisted Patient with discussing the behaviors that she wants to change. Therapist was able to complete treatment plan with Patient.  Plan: Return again in 2 weeks.  Diagnosis: Axis I: Depression    Axis II: No diagnosis    Marinda Elkicole M Peacock, LCSW 09/01/2015

## 2015-09-17 NOTE — Telephone Encounter (Signed)
She needs to get it from her counselor or daughter counselor

## 2015-09-17 NOTE — Telephone Encounter (Signed)
pt called states she needs a letter that explains why it is in the best interest that her daughter Luevenia Maxinjayla live with her mother (child grandmother) .  Pt states that the school system will not let her mother enroll the child in school until they get a letter as to why the child is living with grandmother.

## 2015-09-30 ENCOUNTER — Ambulatory Visit (INDEPENDENT_AMBULATORY_CARE_PROVIDER_SITE_OTHER): Payer: 59 | Admitting: Psychiatry

## 2015-09-30 ENCOUNTER — Ambulatory Visit (INDEPENDENT_AMBULATORY_CARE_PROVIDER_SITE_OTHER): Payer: 59 | Admitting: Licensed Clinical Social Worker

## 2015-09-30 ENCOUNTER — Encounter: Payer: Self-pay | Admitting: Psychiatry

## 2015-09-30 VITALS — BP 131/77 | HR 80 | Temp 98.2°F | Ht 67.0 in | Wt 207.6 lb

## 2015-09-30 DIAGNOSIS — F331 Major depressive disorder, recurrent, moderate: Secondary | ICD-10-CM | POA: Diagnosis not present

## 2015-09-30 DIAGNOSIS — F4001 Agoraphobia with panic disorder: Secondary | ICD-10-CM | POA: Diagnosis not present

## 2015-09-30 MED ORDER — ALPRAZOLAM 0.5 MG PO TABS: 0.5000 mg | ORAL_TABLET | Freq: Every evening | ORAL | 1 refills | Status: DC | PRN

## 2015-09-30 MED ORDER — QUETIAPINE FUMARATE 50 MG PO TABS: 50.0000 mg | ORAL_TABLET | Freq: Every day | ORAL | 1 refills | Status: DC

## 2015-09-30 MED ORDER — FLUOXETINE HCL 10 MG PO CAPS: 10.0000 mg | ORAL_CAPSULE | Freq: Every day | ORAL | 1 refills | Status: DC
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
## 2015-10-01 ENCOUNTER — Ambulatory Visit: Payer: Self-pay | Admitting: Psychiatry

## 2015-10-08 NOTE — Progress Notes (Signed)
   THERAPIST PROGRESS NOTE  Session Time: 73  Participation Level: Active  Behavioral Response: Casual and NeatAlertDepressed  Type of Therapy: Individual Therapy  Treatment Goals addressed: Coping and Diagnosis: Depression  Interventions: CBT and Motivational Interviewing  Summary: Brittney Duran is a 45 y.o. female who presents with continued symptoms of her diagnosis.  Patient was open and talkative throughout the session.  Patient was able to vent her frustration concerning duties/roles in the home. Patient reports that she feels like a single parent although she is married.  Discussion of her being overwhelmed at work, medical concerns and with her children.  Reports that she needs more support from her husband.  Reports that her mother will watch her children for her occasionally.  Report that she cycles with her progress.  Reports that she is sadness most days. Discussion of how to alleviate stress while at work.   Suicidal/Homicidal: No  Therapist Response: Therapist met with Patient in an outpatient setting to assess current mood and assist with making progress towards her goals through the use of therapeutic intervention. Therapist did a brief mood check, assessing anger, fear, disgust, excitement, happiness, and sadness. Therapist provided active listening for Patient as she communicated her current stressors.  Therapist explained the importance of establishing emotional support as well as ensuring that Patient is engaging in adequate self-care during these stress induced times. Therapist then assisted Patient with brainstorming to determine a solution for her current concerns. Therapist and Patient discussed possible financial supports, emotional supports, family, friends, etc.   Plan: Return again in 2 weeks.  Diagnosis: Axis I: Depression    Axis II: No diagnosis    Lubertha South, LCSW 09/30/2015

## 2015-10-27 ENCOUNTER — Encounter: Payer: Self-pay | Admitting: Psychiatry

## 2015-10-27 ENCOUNTER — Ambulatory Visit (INDEPENDENT_AMBULATORY_CARE_PROVIDER_SITE_OTHER): Payer: 59 | Admitting: Licensed Clinical Social Worker

## 2015-10-27 ENCOUNTER — Ambulatory Visit (INDEPENDENT_AMBULATORY_CARE_PROVIDER_SITE_OTHER): Payer: 59 | Admitting: Psychiatry

## 2015-10-27 VITALS — BP 119/72 | HR 70 | Temp 98.5°F | Wt 203.2 lb

## 2015-10-27 DIAGNOSIS — F331 Major depressive disorder, recurrent, moderate: Secondary | ICD-10-CM

## 2015-10-27 DIAGNOSIS — F4001 Agoraphobia with panic disorder: Secondary | ICD-10-CM

## 2015-10-27 MED ORDER — FLUOXETINE HCL 10 MG PO CAPS: 10.0000 mg | ORAL_CAPSULE | Freq: Every day | ORAL | 1 refills | Status: DC

## 2015-10-27 MED ORDER — ALPRAZOLAM 0.5 MG PO TABS: 0.5000 mg | ORAL_TABLET | Freq: Every evening | ORAL | 2 refills | Status: DC | PRN

## 2015-10-27 MED ORDER — QUETIAPINE FUMARATE 50 MG PO TABS: 50.0000 mg | ORAL_TABLET | Freq: Every day | ORAL | 1 refills | Status: DC
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
## 2015-10-31 NOTE — Progress Notes (Signed)
   THERAPIST PROGRESS NOTE  Session Time: 60min  Participation Level: Active  Behavioral Response: Neat and Well GroomedAlertEuthymic  Type of Therapy: Individual Therapy  Treatment Goals addressed: Coping and Diagnosis: Depression  Interventions: CBT, Motivational Interviewing and Solution Focused  Summary: Nehemiah MassedKiawana D Criado is a 45 y.o. female who presents with continued symptoms of her diagnosis.  Patient was able to report her current stressors and mood.  Patient states that she and her husband have been communicating about her not returning to work and possibly finding a new career.  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  Patient reports that she feels as if her job will never be done.  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  Patient appeared excited about the career change aeb her change in speech, smile on her face and a positive outlook on her future.  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    Suicidal/Homicidal: No  Therapist Response: LCSW provided Patient with ongoing emotional support and encouragement.  Normalized her feelings.  Commended Patient on her progress and reinforced the importance of client staying focused on her own strengths and resources and resiliency. Processed various strategies for dealing with stressors. Writer encouraged healthy eating and building a sleep pattern.   Plan: Return again in 2 weeks.  Diagnosis: Axis I: Depression    Axis II: No diagnosis    Marinda Elkicole M Generoso Cropper, LCSW 10/31/2015

## 2015-11-27 ENCOUNTER — Ambulatory Visit: Payer: 59 | Admitting: Licensed Clinical Social Worker

## 2015-12-16 ENCOUNTER — Ambulatory Visit: Payer: 59 | Admitting: Licensed Clinical Social Worker

## 2015-12-29 ENCOUNTER — Ambulatory Visit: Payer: 59 | Admitting: Licensed Clinical Social Worker

## 2016-01-01 ENCOUNTER — Ambulatory Visit: Payer: 59 | Admitting: Licensed Clinical Social Worker

## 2016-01-09 ENCOUNTER — Encounter: Payer: Self-pay | Admitting: Psychiatry

## 2016-01-09 ENCOUNTER — Ambulatory Visit (INDEPENDENT_AMBULATORY_CARE_PROVIDER_SITE_OTHER): Payer: 59 | Admitting: Psychiatry

## 2016-01-09 VITALS — BP 122/84 | HR 84 | Temp 98.3°F | Wt 185.0 lb

## 2016-01-09 DIAGNOSIS — F4001 Agoraphobia with panic disorder: Secondary | ICD-10-CM | POA: Diagnosis not present

## 2016-01-09 DIAGNOSIS — F331 Major depressive disorder, recurrent, moderate: Secondary | ICD-10-CM

## 2016-01-09 MED ORDER — SERTRALINE HCL 100 MG PO TABS: 100.0000 mg | ORAL_TABLET | Freq: Every day | ORAL | 1 refills | Status: DC

## 2016-01-09 MED ORDER — QUETIAPINE FUMARATE 50 MG PO TABS: 50.0000 mg | ORAL_TABLET | Freq: Every day | ORAL | 1 refills | Status: DC

## 2016-01-09 MED ORDER — ALPRAZOLAM 0.5 MG PO TABS: 0.5000 mg | ORAL_TABLET | Freq: Every evening | ORAL | 2 refills | Status: DC | PRN
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
## 2016-01-26 DIAGNOSIS — R5383 Other fatigue: Secondary | ICD-10-CM | POA: Diagnosis not present

## 2016-01-27 ENCOUNTER — Ambulatory Visit: Payer: Self-pay | Admitting: Psychiatry

## 2016-02-03 DIAGNOSIS — Z1231 Encounter for screening mammogram for malignant neoplasm of breast: Secondary | ICD-10-CM | POA: Diagnosis not present

## 2016-02-06 DIAGNOSIS — J340 Abscess, furuncle and carbuncle of nose: Secondary | ICD-10-CM | POA: Diagnosis not present

## 2016-02-06 DIAGNOSIS — J3481 Nasal mucositis (ulcerative): Secondary | ICD-10-CM | POA: Diagnosis not present

## 2016-02-11 ENCOUNTER — Encounter: Payer: Self-pay | Admitting: Psychiatry

## 2016-02-11 ENCOUNTER — Ambulatory Visit (INDEPENDENT_AMBULATORY_CARE_PROVIDER_SITE_OTHER): Payer: 59 | Admitting: Psychiatry

## 2016-02-11 VITALS — BP 97/66 | HR 78 | Temp 97.8°F | Wt 184.4 lb

## 2016-02-11 DIAGNOSIS — F4001 Agoraphobia with panic disorder: Secondary | ICD-10-CM

## 2016-02-11 DIAGNOSIS — F331 Major depressive disorder, recurrent, moderate: Secondary | ICD-10-CM | POA: Diagnosis not present

## 2016-02-11 MED ORDER — SERTRALINE HCL 50 MG PO TABS: 50.0000 mg | ORAL_TABLET | Freq: Every day | ORAL | 1 refills | Status: DC

## 2016-02-11 MED ORDER — QUETIAPINE FUMARATE 50 MG PO TABS: 50.0000 mg | ORAL_TABLET | Freq: Every day | ORAL | 1 refills | Status: DC
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
## 2016-03-16 ENCOUNTER — Telehealth: Payer: Self-pay

## 2016-03-16 NOTE — Telephone Encounter (Signed)
Dr. Gerre CouchEric Chavis is calling in regards to Brittney LeschKiawana Duran.... he needs to speak with you.  if you would please give him a call back.

## 2016-03-17 ENCOUNTER — Encounter: Payer: Self-pay | Admitting: Psychiatry

## 2016-03-17 ENCOUNTER — Ambulatory Visit (INDEPENDENT_AMBULATORY_CARE_PROVIDER_SITE_OTHER): Payer: 59 | Admitting: Psychiatry

## 2016-03-17 VITALS — BP 108/74 | HR 73 | Temp 98.6°F | Wt 180.6 lb

## 2016-03-17 DIAGNOSIS — F4001 Agoraphobia with panic disorder: Secondary | ICD-10-CM

## 2016-03-17 DIAGNOSIS — F331 Major depressive disorder, recurrent, moderate: Secondary | ICD-10-CM | POA: Diagnosis not present

## 2016-03-17 MED ORDER — ALPRAZOLAM 0.25 MG PO TABS: 0.2500 mg | ORAL_TABLET | Freq: Every evening | ORAL | 1 refills | Status: DC | PRN

## 2016-03-17 NOTE — Telephone Encounter (Signed)
Do you have his number? 
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
## 2016-03-18 NOTE — Telephone Encounter (Signed)
[phone]

## 2016-03-18 NOTE — Telephone Encounter (Signed)
left message for patient to stop by office to sign a release in order to speak with dr. Candise Cheeric chavez [phone]

## 2016-03-22 NOTE — Telephone Encounter (Signed)
Called Dr Virgel Manifoldhavez. He stated that he was trying to do Peer to Peer review on the case but it has been closed now. I have to wait on the letter from the insurance compancy to get the case ? Opened to do it again.

## 2016-03-23 NOTE — Telephone Encounter (Signed)
Have you called about this?

## 2016-03-30 DIAGNOSIS — D509 Iron deficiency anemia, unspecified: Secondary | ICD-10-CM | POA: Diagnosis not present

## 2016-04-05 ENCOUNTER — Telehealth: Payer: Self-pay

## 2016-04-05 NOTE — Telephone Encounter (Signed)
pt called states that she signed the release for you to [facility] and you did not talk with then and they told her that they tried 3 times to speak witth you.  pt states now she needs a copy of her medical records and she also needs you to do a clinical assessment letter so she can file and appeal.

## 2016-04-12 NOTE — Telephone Encounter (Signed)
Spoke with Shanda BumpsJessica. Pt should have notified that her case was already closed per Dr Virgel Manifoldhavez and no further action per our last discussion. She need to contact them directly.

## 2016-04-14 ENCOUNTER — Encounter: Payer: Self-pay | Admitting: Psychiatry

## 2016-04-14 ENCOUNTER — Ambulatory Visit (INDEPENDENT_AMBULATORY_CARE_PROVIDER_SITE_OTHER): Payer: 59 | Admitting: Psychiatry

## 2016-04-14 VITALS — BP 110/71 | HR 70 | Temp 98.4°F | Wt 181.0 lb

## 2016-04-14 DIAGNOSIS — F331 Major depressive disorder, recurrent, moderate: Secondary | ICD-10-CM | POA: Diagnosis not present

## 2016-04-14 DIAGNOSIS — F4001 Agoraphobia with panic disorder: Secondary | ICD-10-CM

## 2016-04-14 MED ORDER — QUETIAPINE FUMARATE 25 MG PO TABS: 75.0000 mg | ORAL_TABLET | Freq: Every day | ORAL | 1 refills | Status: DC
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
## 2016-04-14 NOTE — Telephone Encounter (Signed)
Pt came info office today for appt

## 2016-04-14 NOTE — Telephone Encounter (Signed)
Pt came into office today for appt

## 2016-05-26 ENCOUNTER — Ambulatory Visit (INDEPENDENT_AMBULATORY_CARE_PROVIDER_SITE_OTHER): Payer: 59 | Admitting: Psychiatry

## 2016-05-26 ENCOUNTER — Encounter: Payer: Self-pay | Admitting: Psychiatry

## 2016-05-26 VITALS — BP 112/77 | HR 76 | Temp 98.2°F | Wt 182.8 lb

## 2016-05-26 DIAGNOSIS — F331 Major depressive disorder, recurrent, moderate: Secondary | ICD-10-CM | POA: Diagnosis not present

## 2016-05-26 DIAGNOSIS — F4001 Agoraphobia with panic disorder: Secondary | ICD-10-CM

## 2016-05-26 MED ORDER — QUETIAPINE FUMARATE 25 MG PO TABS: 75.0000 mg | ORAL_TABLET | Freq: Every day | ORAL | 1 refills | Status: DC

## 2016-05-26 MED ORDER — SERTRALINE HCL 50 MG PO TABS: 50.0000 mg | ORAL_TABLET | Freq: Every day | ORAL | 1 refills | Status: DC

## 2016-05-26 MED ORDER — ALPRAZOLAM 0.25 MG PO TABS
0.2500 mg | ORAL_TABLET | Freq: Every evening | ORAL | 1 refills | Status: DC | PRN
Start: 2016-05-26 — End: 2016-08-16
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
## 2016-05-27 DIAGNOSIS — N926 Irregular menstruation, unspecified: Secondary | ICD-10-CM | POA: Diagnosis not present

## 2016-05-27 DIAGNOSIS — Z0001 Encounter for general adult medical examination with abnormal findings: Secondary | ICD-10-CM | POA: Diagnosis not present

## 2016-07-07 DIAGNOSIS — D509 Iron deficiency anemia, unspecified: Secondary | ICD-10-CM | POA: Diagnosis not present

## 2016-07-20 ENCOUNTER — Telehealth: Payer: Self-pay | Admitting: Psychiatry

## 2016-07-28 ENCOUNTER — Ambulatory Visit: Payer: 59 | Admitting: Psychiatry

## 2016-08-16 ENCOUNTER — Encounter: Payer: Self-pay | Admitting: Psychiatry

## 2016-08-16 ENCOUNTER — Ambulatory Visit (INDEPENDENT_AMBULATORY_CARE_PROVIDER_SITE_OTHER): Payer: 59 | Admitting: Psychiatry

## 2016-08-16 VITALS — BP 117/77 | HR 71 | Temp 98.2°F | Wt 186.6 lb

## 2016-08-16 DIAGNOSIS — F331 Major depressive disorder, recurrent, moderate: Secondary | ICD-10-CM | POA: Diagnosis not present

## 2016-08-16 DIAGNOSIS — F4001 Agoraphobia with panic disorder: Secondary | ICD-10-CM | POA: Diagnosis not present

## 2016-08-16 MED ORDER — SERTRALINE HCL 50 MG PO TABS: 75.0000 mg | ORAL_TABLET | Freq: Every day | ORAL | 1 refills | Status: DC

## 2016-08-16 MED ORDER — ARIPIPRAZOLE 2 MG PO TABS: 2.0000 mg | ORAL_TABLET | Freq: Every day | ORAL | 1 refills | Status: DC

## 2016-08-16 MED ORDER — ALPRAZOLAM 0.25 MG PO TABS: 0.2500 mg | ORAL_TABLET | Freq: Every evening | ORAL | 1 refills | Status: DC | PRN
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
## 2016-08-18 DIAGNOSIS — M67431 Ganglion, right wrist: Secondary | ICD-10-CM | POA: Diagnosis not present

## 2016-08-18 DIAGNOSIS — D509 Iron deficiency anemia, unspecified: Secondary | ICD-10-CM | POA: Diagnosis not present

## 2016-09-13 ENCOUNTER — Ambulatory Visit (INDEPENDENT_AMBULATORY_CARE_PROVIDER_SITE_OTHER): Payer: 59 | Admitting: Psychiatry

## 2016-09-13 ENCOUNTER — Encounter: Payer: Self-pay | Admitting: Psychiatry

## 2016-09-13 VITALS — BP 101/64 | HR 79 | Temp 98.5°F | Wt 183.0 lb

## 2016-09-13 DIAGNOSIS — F331 Major depressive disorder, recurrent, moderate: Secondary | ICD-10-CM

## 2016-09-13 MED ORDER — ARIPIPRAZOLE 2 MG PO TABS: 2.0000 mg | ORAL_TABLET | Freq: Every day | ORAL | 1 refills | Status: DC

## 2016-09-13 MED ORDER — TRAZODONE HCL 50 MG PO TABS: 50.0000 mg | ORAL_TABLET | Freq: Every day | ORAL | 1 refills | Status: DC
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
## 2016-09-15 NOTE — Telephone Encounter (Signed)
Note was closed. Pt was seen on  08-16-16 and on 09-13-16

## 2016-09-23 ENCOUNTER — Telehealth: Payer: Self-pay

## 2016-09-23 NOTE — Telephone Encounter (Signed)
Will defer the question to Dr. Garnetta BuddyFaheem

## 2016-09-23 NOTE — Telephone Encounter (Signed)
pt called left a message that she can not take the trazodone. pt states she has had nightmares and waking up in a panic since starting this medication. pt would like to try something else. pt was last seen on 09-13-16 next appt 11-08-16

## 2016-09-30 DIAGNOSIS — M79661 Pain in right lower leg: Secondary | ICD-10-CM | POA: Diagnosis not present

## 2016-09-30 DIAGNOSIS — R2241 Localized swelling, mass and lump, right lower limb: Secondary | ICD-10-CM | POA: Diagnosis not present

## 2016-10-11 DIAGNOSIS — R2241 Localized swelling, mass and lump, right lower limb: Secondary | ICD-10-CM | POA: Diagnosis not present

## 2016-10-21 NOTE — Telephone Encounter (Signed)
SEE PREVIOUS MESSAGES.  PLEASE ADVISE.

## 2016-11-08 ENCOUNTER — Telehealth: Payer: Self-pay

## 2016-11-08 ENCOUNTER — Ambulatory Visit (INDEPENDENT_AMBULATORY_CARE_PROVIDER_SITE_OTHER): Payer: 59 | Admitting: Psychiatry

## 2016-11-08 ENCOUNTER — Encounter: Payer: Self-pay | Admitting: Psychiatry

## 2016-11-08 VITALS — BP 120/79 | HR 75 | Temp 98.6°F | Wt 191.0 lb

## 2016-11-08 DIAGNOSIS — F4001 Agoraphobia with panic disorder: Secondary | ICD-10-CM

## 2016-11-08 DIAGNOSIS — F331 Major depressive disorder, recurrent, moderate: Secondary | ICD-10-CM | POA: Diagnosis not present

## 2016-11-08 MED ORDER — ARIPIPRAZOLE 2 MG PO TABS: 2.0000 mg | ORAL_TABLET | Freq: Every day | ORAL | 1 refills | Status: DC

## 2016-11-08 MED ORDER — SERTRALINE HCL 50 MG PO TABS: 75.0000 mg | ORAL_TABLET | Freq: Every day | ORAL | 1 refills | Status: DC

## 2016-11-08 MED ORDER — AMITRIPTYLINE HCL 25 MG PO TABS: 25.0000 mg | ORAL_TABLET | Freq: Every day | ORAL | 1 refills | Status: DC

## 2016-11-08 NOTE — Telephone Encounter (Signed)
Please advise 
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
## 2016-11-08 NOTE — Telephone Encounter (Signed)
Defer to Dr. Faheem

## 2016-11-08 NOTE — Telephone Encounter (Signed)
received a fax stating that amitriptylin 25mg  interaction with sertraline

## 2016-11-11 DIAGNOSIS — J0191 Acute recurrent sinusitis, unspecified: Secondary | ICD-10-CM | POA: Diagnosis not present

## 2016-11-11 DIAGNOSIS — J3481 Nasal mucositis (ulcerative): Secondary | ICD-10-CM | POA: Diagnosis not present

## 2016-11-16 ENCOUNTER — Telehealth: Payer: Self-pay

## 2016-11-16 NOTE — Telephone Encounter (Signed)
dr. Elonda Huskygoldman wants you to call him back on this patient. [phone]

## 2016-11-19 NOTE — Telephone Encounter (Signed)
DR. Elonda HuskyGOLDMAN CALLED AGAIN AND LEFT A MESSAGE THAT HE NEEDS TO SPEAK WITH YOU IN REGARDS TO THIS PATIENT.

## 2016-12-13 ENCOUNTER — Encounter: Payer: Self-pay | Admitting: Psychiatry

## 2016-12-13 ENCOUNTER — Ambulatory Visit (INDEPENDENT_AMBULATORY_CARE_PROVIDER_SITE_OTHER): Payer: 59 | Admitting: Psychiatry

## 2016-12-13 VITALS — BP 128/83 | HR 77 | Ht 67.0 in | Wt 196.0 lb

## 2016-12-13 DIAGNOSIS — F331 Major depressive disorder, recurrent, moderate: Secondary | ICD-10-CM | POA: Diagnosis not present

## 2016-12-13 DIAGNOSIS — F4001 Agoraphobia with panic disorder: Secondary | ICD-10-CM | POA: Diagnosis not present

## 2016-12-13 MED ORDER — ARIPIPRAZOLE 2 MG PO TABS
2.0000 mg | ORAL_TABLET | Freq: Every day | ORAL | 1 refills | Status: DC
Start: 2016-12-13 — End: 2017-02-08

## 2016-12-13 MED ORDER — SERTRALINE HCL 50 MG PO TABS
75.0000 mg | ORAL_TABLET | Freq: Every day | ORAL | 1 refills | Status: DC
Start: 2016-12-13 — End: 2017-03-22

## 2016-12-13 MED ORDER — AMITRIPTYLINE HCL 25 MG PO TABS: 25.0000 mg | ORAL_TABLET | Freq: Every day | ORAL | 1 refills | Status: DC
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
## 2016-12-31 ENCOUNTER — Ambulatory Visit (INDEPENDENT_AMBULATORY_CARE_PROVIDER_SITE_OTHER): Payer: 59 | Admitting: Obstetrics and Gynecology

## 2016-12-31 ENCOUNTER — Encounter: Payer: Self-pay | Admitting: Obstetrics and Gynecology

## 2016-12-31 VITALS — BP 118/78 | Ht 67.0 in | Wt 200.0 lb

## 2016-12-31 DIAGNOSIS — N6320 Unspecified lump in the left breast, unspecified quadrant: Secondary | ICD-10-CM | POA: Diagnosis not present

## 2016-12-31 DIAGNOSIS — N6325 Unspecified lump in the left breast, overlapping quadrants: Secondary | ICD-10-CM

## 2016-12-31 DIAGNOSIS — Z124 Encounter for screening for malignant neoplasm of cervix: Secondary | ICD-10-CM | POA: Diagnosis not present

## 2016-12-31 DIAGNOSIS — Z01419 Encounter for gynecological examination (general) (routine) without abnormal findings: Secondary | ICD-10-CM

## 2016-12-31 DIAGNOSIS — Z1339 Encounter for screening examination for other mental health and behavioral disorders: Secondary | ICD-10-CM

## 2016-12-31 NOTE — Progress Notes (Signed)
Gynecology Annual Exam   PCP: Lyndon Code, MD  Chief Complaint  Patient presents with  . Annual Exam   History of Present Illness:  Ms. NAIJA TROOST is an established patient,  46 y.o. [phone] who LMP was Patient's last menstrual period was 12/27/2016., presents today for her annual examination.  Her menses are regular every 28-30 days, lasting 5 day(s).  Dysmenorrhea severe, occurring first 1-2 days of flow. She does not have intermenstrual bleeding.  She does not have vasomotor sx.   She is single partner, contraception - IUD (Paragard). She does not have vaginal dryness.  Last Pap: 2015  Results were: no abnormalities /neg HPV DNA.  Hx of STDs: none  Last mammogram: 02/03/2016  Results were: BiRads 2 benign There is FH of breast cancer in her 2nd cousin. There is no FH of ovarian cancer. The patient does do self-breast exams.  Tobacco use: former smoker Alcohol use: social drinker Exercise: moderately active  The patient wears seatbelts: yes.     She has a history of depression. She sees a psychiatrist.  Her depression is under good control today.    She notes a "tugging" sensation on her left breast. She has noticed it more prominently for the past 2-3 weeks.  No other breast symptoms.   Past Medical History:  Diagnosis Date  . Anemia   . Anxiety    Panic attacks  . Complication of anesthesia    pt reports "local" wears off quickly  . Depression   . Environmental allergies   . Hemorrhoids   . Leaky heart valve   . Wears contact lenses     Past Surgical History:  Procedure Laterality Date  . Caesaran section  1989  . COLONOSCOPY WITH PROPOFOL N/A 10/07/2014   Procedure: COLONOSCOPY WITH PROPOFOL;  Surgeon: [name], MD;  Location: [facility];  Service: Endoscopy;  Laterality: N/A;  Latex  . HERNIA REPAIR  1978    Prior to Admission medications   Medication Sig Start Date End Date Taking? Authorizing Provider  amitriptyline (ELAVIL) 25 MG  tablet Take 1 tablet (25 mg total) by mouth at bedtime. 12/13/16   Brandy Hale, MD  ARIPiprazole (ABILIFY) 2 MG tablet Take 1 tablet (2 mg total) by mouth daily. 12/13/16   Brandy Hale, MD  aspirin 81 MG tablet Take 162 mg by mouth daily.    [provider]  cetirizine-pseudoephedrine (ZYRTEC-D) 5-120 MG per tablet Take 1 tablet by mouth 2 (two) times daily as needed for allergies.    [provider]  Cholecalciferol (VITAMIN D3) 5000 units CAPS Take 1 capsule by mouth daily.    [provider]  Fe Cbn-Fe Gluc-FA-B12-C-DSS (FERRALET 90) 90-1 MG TABS  08/07/15   [provider]  mupirocin ointment (BACTROBAN) 2 %  02/02/16   [provider]  PARAGARD INTRAUTERINE COPPER IUD IUD 1 each by Intrauterine route once.    [provider]  sertraline (ZOLOFT) 50 MG tablet Take 1.5 tablets (75 mg total) by mouth daily. 12/13/16   Brandy Hale, MD  vitamin B-12 (CYANOCOBALAMIN) 1000 MCG tablet Take 1,000 mcg by mouth daily as needed (energy). Reported on 05/08/2015    [provider]    Allergies  Allergen Reactions  . Penicillins Other (See Comments)    Unknown reaction Has patient had a PCN reaction causing immediate rash, facial/tongue/throat swelling, SOB or lightheadedness with hypotension: YES Has patient had a PCN reaction causing severe rash involving mucus membranes  or skin necrosis: NO Has patient had a PCN reaction that required hospitalizationNO Has patient had a PCN reaction occurring within the last 10 years: NO If all of the above answers are "NO", then may proceed with Cephalosporin use.  . Shellfish Allergy Swelling    lips  . Latex Rash   Obstetric History: [id number]  Family History  Problem Relation Age of Onset  . Hypertension Mother   . Seizures Brother   . Pancreatic cancer Unknown     Social History   Socioeconomic History  . Marital status: Married    Spouse name: Not on file  . Number of children: 5  .  Years of education: 8316  . Highest education level: Not on file  Social Needs  . Financial resource strain: Not on file  . Food insecurity - worry: Not on file  . Food insecurity - inability: Not on file  . Transportation needs - medical: Not on file  . Transportation needs - non-medical: Not on file  Occupational History  . Occupation: [employer]  Tobacco Use  . Smoking status: Former Smoker    Years: 4.00  . Smokeless tobacco: Never Used  Substance and Sexual Activity  . Alcohol use: Yes    Alcohol/week: 2.4 oz    Types: 4 Glasses of wine per week    Comment: "socially"  . Drug use: No  . Sexual activity: Yes    Birth control/protection: IUD  Other Topics Concern  . Not on file  Social History Narrative   Lives at home w/ her husband and children   Left-handed   Drinks 1 cup of coffee per day    Review of Systems  Constitutional: Negative.   HENT: Negative.   Eyes: Negative.   Respiratory: Negative.   Cardiovascular: Negative.   Gastrointestinal: Negative.   Genitourinary: Negative.   Musculoskeletal: Negative.   Skin: Negative for itching and rash.       Tugging sensation and "lump" at left breast.  Neurological: Negative.   Psychiatric/Behavioral: Negative.      Physical Exam BP 118/78   Ht 5\' 7"  (1.702 m)   Wt 200 lb (90.7 kg)   LMP 12/27/2016   BMI 31.32 kg/m   Physical Exam  Constitutional: She is oriented to person, place, and time. She appears well-developed and well-nourished. No distress.  Genitourinary: Uterus normal. Pelvic exam was performed with patient supine. There is no rash, tenderness, lesion or injury on the right labia. There is no rash, tenderness, lesion or injury on the left labia. No erythema, tenderness or bleeding in the vagina. No signs of injury around the vagina. No vaginal discharge found. Right adnexum does not display mass, does not display tenderness and does not display fullness. Left adnexum does not display mass, does not display  tenderness and does not display fullness.  Cervix exhibits visible IUD strings. Cervix does not exhibit motion tenderness, lesion, discharge or polyp.   Uterus is mobile and anteverted. Uterus is not enlarged, tender or exhibiting a mass.  HENT:  Head: Normocephalic and atraumatic.  Eyes: EOM are normal. No scleral icterus.  Neck: Normal range of motion. Neck supple. No thyromegaly present.  Cardiovascular: Normal rate and regular rhythm. Exam reveals no gallop and no friction rub.  No murmur heard. Pulmonary/Chest: Effort normal and breath sounds normal. No respiratory distress. She has no wheezes. She has no rales. Right breast exhibits no inverted nipple, no mass, no nipple discharge, no skin change and no tenderness. Left breast exhibits  mass. Left breast exhibits no inverted nipple, no nipple discharge, no skin change and no tenderness.  Left breast, about 3-4 cm from left nipple is an area of what palpates like fibrocystic breast changes.  However, it is in the area of patient concern. The lesion is mobile, non-tender, no overlying skin changes, it is not firm.    Abdominal: Soft. Bowel sounds are normal. She exhibits no distension and no mass. There is no tenderness. There is no rebound and no guarding.  Musculoskeletal: Normal range of motion. She exhibits no edema or tenderness.  Lymphadenopathy:    She has no cervical adenopathy.       Right: No inguinal adenopathy present.       Left: No inguinal adenopathy present.  Neurological: She is alert and oriented to person, place, and time. No cranial nerve deficit.  Skin: Skin is warm and dry. No rash noted. No erythema.  Psychiatric: She has a normal mood and affect. Her behavior is normal. Judgment normal.    Female chaperone present for pelvic and breast  portions of the physical exam  Results: AUDIT Questionnaire (screen for alcoholism): 7   Assessment: 46 y.o. [id number] female here for routine gynecologic  examination.  Plan: Problem List Items Addressed This Visit    None    Visit Diagnoses    Women's annual routine gynecological examination    -  Primary   Relevant Orders   IGP, Aptima HPV, rfx 16/18,45   Screening for alcohol problem       Breast lump on left side at 3 o'clock position       Relevant Orders   MM DIAG BREAST TOMO BILATERAL   Pap smear for cervical cancer screening       Relevant Orders   IGP, Aptima HPV, rfx 16/18,45      Screening: -- Blood pressure screen normal -- Colonoscopy - not due -- Mammogram - Ordering diagnostic mammogram due to lump noted -- Weight screening: obese: discussed management options, including lifestyle, dietary, and exercise. -- Depression screening negative (PHQ-9) -- Nutrition: normal -- cholesterol screening: per PCP -- osteoporosis screening: not due -- tobacco screening: not using -- alcohol screening: AUDIT questionnaire indicates low-risk usage. -- family history of breast cancer screening: done. not at high risk. -- no evidence of domestic violence or intimate partner violence. -- STD screening: gonorrhea/chlamydia NAAT not collected per patient request. -- pap smear collected per ASCCP guidelines -- flu vaccine per PCP -- HPV vaccination series: not eligilbe  Thomasene MohairStephen Jotham Ahn, MD 12/31/2016 1:29 PM

## 2017-01-05 LAB — IGP, APTIMA HPV, RFX 16/18,45
HPV Aptima: NEGATIVE
PAP Smear Comment: 0

## 2017-01-06 ENCOUNTER — Encounter: Payer: Self-pay | Admitting: Obstetrics and Gynecology

## 2017-01-10 ENCOUNTER — Ambulatory Visit: Payer: 59 | Admitting: Psychiatry

## 2017-01-12 DIAGNOSIS — N6320 Unspecified lump in the left breast, unspecified quadrant: Secondary | ICD-10-CM | POA: Diagnosis not present

## 2017-01-12 DIAGNOSIS — R928 Other abnormal and inconclusive findings on diagnostic imaging of breast: Secondary | ICD-10-CM | POA: Diagnosis not present

## 2017-01-17 ENCOUNTER — Ambulatory Visit: Payer: Self-pay | Admitting: Nurse Practitioner

## 2017-02-03 ENCOUNTER — Ambulatory Visit: Payer: Self-pay | Admitting: Psychiatry

## 2017-02-08 ENCOUNTER — Ambulatory Visit: Payer: 59 | Admitting: Psychiatry

## 2017-02-08 ENCOUNTER — Other Ambulatory Visit: Payer: Self-pay

## 2017-02-08 ENCOUNTER — Encounter: Payer: Self-pay | Admitting: Psychiatry

## 2017-02-08 VITALS — BP 123/73 | HR 76 | Temp 98.6°F | Wt 206.2 lb

## 2017-02-08 DIAGNOSIS — F410 Panic disorder [episodic paroxysmal anxiety] without agoraphobia: Secondary | ICD-10-CM

## 2017-02-08 DIAGNOSIS — F3181 Bipolar II disorder: Secondary | ICD-10-CM

## 2017-02-08 MED ORDER — ARIPIPRAZOLE 10 MG PO TABS: 5.0000 mg | ORAL_TABLET | Freq: Every day | ORAL | 1 refills | Status: DC

## 2017-02-08 MED ORDER — HYDROXYZINE PAMOATE 25 MG PO CAPS: 25.0000 mg | ORAL_CAPSULE | Freq: Three times a day (TID) | ORAL | 1 refills | Status: DC | PRN

## 2017-02-08 NOTE — Patient Instructions (Signed)
Hydroxyzine capsules or tablets What is this medicine? HYDROXYZINE (hye DROX i zeen) is an antihistamine. This medicine is used to treat allergy symptoms. It is also used to treat anxiety and tension. This medicine can be used with other medicines to induce sleep before surgery. This medicine may be used for other purposes; ask your health care provider or pharmacist if you have questions. COMMON BRAND NAME(S): ANX, Atarax, Rezine, Vistaril What should I tell my health care provider before I take this medicine? They need to know if you have any of these conditions: -any chronic illness -difficulty passing urine -glaucoma -heart disease -kidney disease -liver disease -lung disease -an unusual or allergic reaction to hydroxyzine, cetirizine, other medicines, foods, dyes, or preservatives -pregnant or trying to get pregnant -breast-feeding How should I use this medicine? Take this medicine by mouth with a full glass of water. Follow the directions on the prescription label. You may take this medicine with food or on an empty stomach. Take your medicine at regular intervals. Do not take your medicine more often than directed. Talk to your pediatrician regarding the use of this medicine in children. Special care may be needed. While this drug may be prescribed for children as young as 6 years of age for selected conditions, precautions do apply. Patients over 65 years old may have a stronger reaction and need a smaller dose. Overdosage: If you think you have taken too much of this medicine contact a poison control center or emergency room at once. NOTE: This medicine is only for you. Do not share this medicine with others. What if I miss a dose? If you miss a dose, take it as soon as you can. If it is almost time for your next dose, take only that dose. Do not take double or extra doses. What may interact with this medicine? -alcohol -barbiturate medicines for sleep or seizures -medicines for  colds, allergies -medicines for depression, anxiety, or emotional disturbances -medicines for pain -medicines for sleep -muscle relaxants This list may not describe all possible interactions. Give your health care provider a list of all the medicines, herbs, non-prescription drugs, or dietary supplements you use. Also tell them if you smoke, drink alcohol, or use illegal drugs. Some items may interact with your medicine. What should I watch for while using this medicine? Tell your doctor or health care professional if your symptoms do not improve. You may get drowsy or dizzy. Do not drive, use machinery, or do anything that needs mental alertness until you know how this medicine affects you. Do not stand or sit up quickly, especially if you are an older patient. This reduces the risk of dizzy or fainting spells. Alcohol may interfere with the effect of this medicine. Avoid alcoholic drinks. Your mouth may get dry. Chewing sugarless gum or sucking hard candy, and drinking plenty of water may help. Contact your doctor if the problem does not go away or is severe. This medicine may cause dry eyes and blurred vision. If you wear contact lenses you may feel some discomfort. Lubricating drops may help. See your eye doctor if the problem does not go away or is severe. If you are receiving skin tests for allergies, tell your doctor you are using this medicine. What side effects may I notice from receiving this medicine? Side effects that you should report to your doctor or health care professional as soon as possible: -fast or irregular heartbeat -difficulty passing urine -seizures -slurred speech or confusion -tremor Side effects that   usually do not require medical attention (report to your doctor or health care professional if they continue or are bothersome): -constipation -drowsiness -fatigue -headache -stomach upset This list may not describe all possible side effects. Call your doctor for  medical advice about side effects. You may report side effects to FDA at 1-800-FDA-1088. Where should I keep my medicine? Keep out of the reach of children. Store at room temperature between 15 and 30 degrees C (59 and 86 degrees F). Keep container tightly closed. Throw away any unused medicine after the expiration date. NOTE: This sheet is a summary. It may not cover all possible information. If you have questions about this medicine, talk to your doctor, pharmacist, or health care provider.  2018 Elsevier/Gold Standard (2007-05-19 14:50:59)  
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
## 2017-02-09 ENCOUNTER — Encounter: Payer: Self-pay | Admitting: Psychiatry

## 2017-03-01 ENCOUNTER — Telehealth (HOSPITAL_COMMUNITY): Payer: Self-pay | Admitting: Psychiatry

## 2017-03-01 ENCOUNTER — Ambulatory Visit: Payer: 59 | Admitting: Licensed Clinical Social Worker

## 2017-03-01 DIAGNOSIS — F3181 Bipolar II disorder: Secondary | ICD-10-CM

## 2017-03-01 NOTE — Telephone Encounter (Signed)
D:  Nolon RodNicole Peacock, LCSW referred pt to [facility].  A:  Placed call to pt, but there was no answer.  Left vm for pt to call writer back.     Jeri Modenaita Josia Cueva, M.Ed, CNA

## 2017-03-08 ENCOUNTER — Encounter: Payer: Self-pay | Admitting: Psychiatry

## 2017-03-08 ENCOUNTER — Other Ambulatory Visit: Payer: Self-pay

## 2017-03-08 ENCOUNTER — Ambulatory Visit: Payer: 59 | Admitting: Psychiatry

## 2017-03-08 DIAGNOSIS — F3181 Bipolar II disorder: Secondary | ICD-10-CM | POA: Diagnosis not present

## 2017-03-08 DIAGNOSIS — F410 Panic disorder [episodic paroxysmal anxiety] without agoraphobia: Secondary | ICD-10-CM | POA: Diagnosis not present

## 2017-03-08 MED ORDER — HYDROXYZINE PAMOATE 25 MG PO CAPS: 25.0000 mg | ORAL_CAPSULE | Freq: Four times a day (QID) | ORAL | 1 refills | Status: DC | PRN

## 2017-03-08 MED ORDER — ZOLPIDEM TARTRATE 5 MG PO TABS: 5.0000 mg | ORAL_TABLET | Freq: Every evening | ORAL | 1 refills | Status: DC | PRN

## 2017-03-08 MED ORDER — ARIPIPRAZOLE 10 MG PO TABS: 10.0000 mg | ORAL_TABLET | Freq: Every day | ORAL | 1 refills | Status: DC

## 2017-03-08 NOTE — Patient Instructions (Signed)
Zolpidem tablets What is this medicine? ZOLPIDEM (zole PI dem) is used to treat insomnia. This medicine helps you to fall asleep and sleep through the night. This medicine may be used for other purposes; ask your health care provider or pharmacist if you have questions. COMMON BRAND NAME(S): Ambien What should I tell my health care provider before I take this medicine? They need to know if you have any of these conditions: -depression -history of drug abuse or addiction -if you often drink alcohol -liver disease -lung or breathing disease -myasthenia gravis -sleep apnea -suicidal thoughts, plans, or attempt; a previous suicide attempt by you or a family member -an unusual or allergic reaction to zolpidem, other medicines, foods, dyes, or preservatives -pregnant or trying to get pregnant -breast-feeding How should I use this medicine? Take this medicine by mouth with a glass of water. Follow the directions on the prescription label. It is better to take this medicine on an empty stomach and only when you are ready for bed. Do not take your medicine more often than directed. If you have been taking this medicine for several weeks and suddenly stop taking it, you may get unpleasant withdrawal symptoms. Your doctor or health care professional may want to gradually reduce the dose. Do not stop taking this medicine on your own. Always follow your doctor or health care professional's advice. A special MedGuide will be given to you by the pharmacist with each prescription and refill. Be sure to read this information carefully each time. Talk to your pediatrician regarding the use of this medicine in children. Special care may be needed. Overdosage: If you think you have taken too much of this medicine contact a poison control center or emergency room at once. NOTE: This medicine is only for you. Do not share this medicine with others. What if I miss a dose? This does not apply. This medicine should  only be taken immediately before going to sleep. Do not take double or extra doses. What may interact with this medicine? -alcohol -antihistamines for allergy, cough and cold -certain medicines for anxiety or sleep -certain medicines for depression, like amitriptyline, fluoxetine, sertraline -certain medicines for fungal infections like ketoconazole and itraconazole -certain medicines for seizures like phenobarbital, primidone -ciprofloxacin -dietary supplements for sleep, like valerian or kava kava -general anesthetics like halothane, isoflurane, methoxyflurane, propofol -local anesthetics like lidocaine, pramoxine, tetracaine -medicines that relax muscles for surgery -narcotic medicines for pain -phenothiazines like chlorpromazine, mesoridazine, prochlorperazine, thioridazine -rifampin This list may not describe all possible interactions. Give your health care provider a list of all the medicines, herbs, non-prescription drugs, or dietary supplements you use. Also tell them if you smoke, drink alcohol, or use illegal drugs. Some items may interact with your medicine. What should I watch for while using this medicine? Visit your doctor or health care professional for regular checks on your progress. Keep a regular sleep schedule by going to bed at about the same time each night. Avoid caffeine-containing drinks in the evening hours. When sleep medicines are used every night for more than a few weeks, they may stop working. Talk to your doctor if you still have trouble sleeping. After taking this medicine for sleep, you may get up out of bed while not being fully awake and do an activity that you do not know you are doing. The next morning, you may have no memory of the event. Activities such as driving a car ("sleep-driving"), making and eating food, talking on the phone, sexual activity,   and sleep-walking have been reported. Call your doctor right away if you find out you have done any of these  activities. Do not take this medicine if you have used alcohol that evening or before bed or taken another medicine for sleep since your risk of doing these sleep-related activities will be increased. Wait for at least 8 hours after you take a dose before driving or doing other activities that require full mental alertness. Do not take this medicine unless you are able to stay in bed for a full night (7 to 8 hours) before you must be active again. You may have a decrease in mental alertness the day after use, even if you feel that you are fully awake. Tell your doctor if you will need to perform activities requiring full alertness, such as driving, the next day. Do not stand or sit up quickly after taking this medicine, especially if you are an older patient. This reduces the risk of dizzy or fainting spells. If you or your family notice any changes in your behavior, such as new or worsening depression, thoughts of harming yourself, anxiety, other unusual or disturbing thoughts, or memory loss, call your doctor right away. After you stop taking this medicine, you may have trouble falling asleep. This is called rebound insomnia. This problem usually goes away on its own after 1 or 2 nights. What side effects may I notice from receiving this medicine? Side effects that you should report to your doctor or health care professional as soon as possible: -allergic reactions like skin rash, itching or hives, swelling of the face, lips, or tongue -breathing problems -changes in vision -confusion -depressed mood or other changes in moods or emotions -feeling faint or lightheaded, falls -hallucinations -loss of balance or coordination -loss of memory -numbness or tingling of the tongue -restlessness, excitability, or feelings of anxiety or agitation -signs and symptoms of liver injury like dark yellow or brown urine; general ill feeling or flu-like symptoms; light-colored stools; loss of appetite; nausea;  right upper belly pain; unusually weak or tired; yellowing of the eyes or skin -suicidal thoughts -unusual activities while asleep like driving, eating, making phone calls, or sexual activity Side effects that usually do not require medical attention (report to your doctor or health care professional if they continue or are bothersome): -dizziness -drowsiness the day after you take this medicine -headache This list may not describe all possible side effects. Call your doctor for medical advice about side effects. You may report side effects to FDA at 1-800-FDA-1088. Where should I keep my medicine? Keep out of the reach of children. This medicine can be abused. Keep your medicine in a safe place to protect it from theft. Do not share this medicine with anyone. Selling or giving away this medicine is dangerous and against the law. This medicine may cause accidental overdose and death if taken by other adults, children, or pets. Mix any unused medicine with a substance like cat litter or coffee grounds. Then throw the medicine away in a sealed container like a sealed bag or a coffee can with a lid. Do not use the medicine after the expiration date. Store at room temperature between 20 and 25 degrees C (68 and 77 degrees F). NOTE: This sheet is a summary. It may not cover all possible information. If you have questions about this medicine, talk to your doctor, pharmacist, or health care provider.  2018 Elsevier/Gold Standard (2015-04-09 14:38:20)  
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
## 2017-03-09 ENCOUNTER — Encounter: Payer: Self-pay | Admitting: Psychiatry

## 2017-03-09 ENCOUNTER — Telehealth: Payer: Self-pay

## 2017-03-09 DIAGNOSIS — F3181 Bipolar II disorder: Secondary | ICD-10-CM

## 2017-03-09 NOTE — Telephone Encounter (Signed)
I will see her in 10 days and may need to readjust her meds then. So no need for 90 days supply yet. pls let pharmacy know.

## 2017-03-09 NOTE — Telephone Encounter (Signed)
received fax stating that insurance requires 90 day supply pleae send a new rx with 90 days supply . pt last seen on  03-08-17 next appt  03-22-17

## 2017-03-14 ENCOUNTER — Telehealth: Payer: Self-pay

## 2017-03-14 NOTE — Telephone Encounter (Signed)
Received fax from pharmacy states that insurance requires a 90 day supply please send in a new rx for 90 day supply so insurance will cover pt medication.   ARIPiprazole (ABILIFY) 10 MG tablet  Medication  Date: 03/08/2017 Department: [facility] Ordering/Authorizing: Jomarie LongsEappen, Saramma, MD  Order Providers   Prescribing Provider Encounter Provider  Jomarie LongsEappen, Saramma, MD Jomarie LongsEappen, Saramma, MD  Medication Detail    Disp Refills Start End   ARIPiprazole (ABILIFY) 10 MG tablet 30 tablet 1 03/08/2017    Sig - Route: Take 1 tablet (10 mg total) by mouth daily. - Oral   Sent to pharmacy as: ARIPiprazole (ABILIFY) 10 MG tablet   E-Prescribing Status: Receipt confirmed by pharmacy (03/08/2017 4:23 PM EST)

## 2017-03-14 NOTE — Telephone Encounter (Signed)
Pharmacy called and told dr Elna BreslowEappen will not do a 90 day supply on medication. They state they have patient contact us.

## 2017-03-14 NOTE — Telephone Encounter (Signed)
Discussed with Shanda BumpsJessica CMA

## 2017-03-22 ENCOUNTER — Other Ambulatory Visit: Payer: Self-pay

## 2017-03-22 ENCOUNTER — Encounter: Payer: Self-pay | Admitting: Psychiatry

## 2017-03-22 ENCOUNTER — Ambulatory Visit: Payer: 59 | Admitting: Psychiatry

## 2017-03-22 VITALS — BP 118/79 | HR 77 | Temp 98.3°F | Wt 210.0 lb

## 2017-03-22 DIAGNOSIS — F410 Panic disorder [episodic paroxysmal anxiety] without agoraphobia: Secondary | ICD-10-CM | POA: Diagnosis not present

## 2017-03-22 DIAGNOSIS — F3181 Bipolar II disorder: Secondary | ICD-10-CM

## 2017-03-22 MED ORDER — SERTRALINE HCL 50 MG PO TABS: 75.0000 mg | ORAL_TABLET | Freq: Every day | ORAL | 2 refills | Status: DC
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
## 2017-04-04 ENCOUNTER — Ambulatory Visit: Payer: 59 | Admitting: Psychiatry

## 2017-04-06 NOTE — Progress Notes (Signed)
  THERAPIST PROGRESS NOTE   Date of Service:   03/01/2017  Session Time:  1hour  Patient:   Brittney Duran   DOB:   04-28-1970  MR Number:  [id number]  [facility]  [facility] [address] [place] [address] Dept: [phone]            Provider/Observer:  Marinda ElkNicole M Azarie Coriz Counselor  Risk of Suicide/Violence: low   Diagnosis:    Bipolar 2 disorder, major depressive episode (HCC)  Type of Therapy: Individual Therapy  Treatment Goals addressed: Coping  Participation Level: Active   Interventions: CBT and Motivational Interviewing  Behavioral Response: CasualAlertDepressed   Summary: Patient reported her mood as "at times good and bad."  Patient vacillated between baseline and intense anxiety throughout the session.  Patient reported that she wants to attend therapy sessions at least twice month.  Patient reported that she is doing well with her mood, eating and sleeping habits. She reports occasional suicidal thoughts.  Patient reported that she feels like a burden on her family due to all of her issues.  She reports being worried that her husband will become tired of having to take care of everything in the home and leave her.   Patient began to feel more comfortable with engagement in relaxation strategies. Therapist [place] Patient [place] [facility] [place].  [facility]  Plan: Brittney Duran will continue with therapy to alleviate mood, attend [facility]   Return again in 2 weeks.

## 2017-04-18 ENCOUNTER — Encounter: Payer: Self-pay | Admitting: Psychiatry

## 2017-04-18 ENCOUNTER — Ambulatory Visit: Payer: 59 | Admitting: Licensed Clinical Social Worker

## 2017-04-18 ENCOUNTER — Ambulatory Visit (INDEPENDENT_AMBULATORY_CARE_PROVIDER_SITE_OTHER): Payer: 59 | Admitting: Psychiatry

## 2017-04-18 ENCOUNTER — Other Ambulatory Visit: Payer: Self-pay

## 2017-04-18 VITALS — BP 120/78 | HR 69 | Temp 98.6°F | Wt 207.4 lb

## 2017-04-18 DIAGNOSIS — F410 Panic disorder [episodic paroxysmal anxiety] without agoraphobia: Secondary | ICD-10-CM | POA: Diagnosis not present

## 2017-04-18 DIAGNOSIS — F3181 Bipolar II disorder: Secondary | ICD-10-CM

## 2017-04-18 MED ORDER — ZOLPIDEM TARTRATE 10 MG PO TABS: 5.0000 mg | ORAL_TABLET | Freq: Every evening | ORAL | 1 refills | Status: DC | PRN

## 2017-04-18 MED ORDER — ARIPIPRAZOLE 10 MG PO TABS: 15.0000 mg | ORAL_TABLET | Freq: Every day | ORAL | 1 refills | Status: DC
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
## 2017-04-19 ENCOUNTER — Encounter: Payer: Self-pay | Admitting: Psychiatry

## 2017-04-19 ENCOUNTER — Telehealth: Payer: Self-pay

## 2017-04-19 MED ORDER — ARIPIPRAZOLE 10 MG PO TABS: 15.0000 mg | ORAL_TABLET | Freq: Every day | ORAL | 0 refills | Status: DC

## 2017-04-19 NOTE — Telephone Encounter (Signed)
received fax that pt insurance requires a 90 day supply of the aripiprazole 10mg  cannot fill a 30 day supply.

## 2017-04-19 NOTE — Telephone Encounter (Signed)
OK will send it

## 2017-05-16 ENCOUNTER — Ambulatory Visit (INDEPENDENT_AMBULATORY_CARE_PROVIDER_SITE_OTHER): Payer: 59 | Admitting: Licensed Clinical Social Worker

## 2017-05-16 ENCOUNTER — Ambulatory Visit (INDEPENDENT_AMBULATORY_CARE_PROVIDER_SITE_OTHER): Payer: 59 | Admitting: Psychiatry

## 2017-05-16 ENCOUNTER — Encounter: Payer: Self-pay | Admitting: Psychiatry

## 2017-05-16 ENCOUNTER — Other Ambulatory Visit: Payer: Self-pay

## 2017-05-16 VITALS — BP 119/78 | HR 71 | Temp 98.3°F | Wt 209.8 lb

## 2017-05-16 DIAGNOSIS — F3181 Bipolar II disorder: Secondary | ICD-10-CM

## 2017-05-16 DIAGNOSIS — Z634 Disappearance and death of family member: Secondary | ICD-10-CM | POA: Diagnosis not present

## 2017-05-16 DIAGNOSIS — F410 Panic disorder [episodic paroxysmal anxiety] without agoraphobia: Secondary | ICD-10-CM

## 2017-05-16 MED ORDER — LITHIUM CARBONATE 300 MG PO TABS: 300.0000 mg | ORAL_TABLET | Freq: Every day | ORAL | 1 refills | Status: DC

## 2017-05-16 MED ORDER — ZOLPIDEM TARTRATE 10 MG PO TABS: 5.0000 mg | ORAL_TABLET | Freq: Every evening | ORAL | 1 refills | Status: DC | PRN

## 2017-05-16 MED ORDER — SERTRALINE HCL 50 MG PO TABS: 75.0000 mg | ORAL_TABLET | Freq: Every day | ORAL | 1 refills | Status: DC
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
## 2017-05-17 ENCOUNTER — Encounter: Payer: Self-pay | Admitting: Psychiatry

## 2017-06-07 NOTE — Progress Notes (Signed)
   THERAPIST PROGRESS NOTE  Session Time:63min  Participation Level: Active  Behavioral Response: CasualAlertIrritable  Type of Therapy: Individual Therapy  Treatment Goals addressed: Coping  Interventions: CBT and Motivational Interviewing  Summary: KAHLEN BOYDE is a 47 y.o. female who presents with continued symptoms of her diagnosis.  Patient reports that she has been impulsive lately. She was able to recall not driving appropriately and having thoughts of self harm.  She reports that she does not have a daily schedule and continues to seek assistance from mother and husband to keep the household going. Discussion of her having a part time job or volunteering to aid in recovery.  Therapist explored with Patient hercurrent SI thoughts and managing her symptoms.  Explored with Patient increasing her self esteem and how to do so. Provided patient with specific task such as bathing daily, using perfume and dressing in a way that makes her feel better and wanted by her husband.  Explored the ability to use coping skills outside of therapy session to decrease symptoms.  At the end of the session, patient reports that she will create a daily schedule and starting with getting dressed daily.   Suicidal/Homicidal: No   Plan: Return again in1 weeks.  Diagnosis: Axis I: Bipolar, Depressed    Axis II: No diagnosis    Marinda Elk, LCSW 05/16/17

## 2017-06-08 ENCOUNTER — Ambulatory Visit: Payer: 59 | Admitting: Psychiatry

## 2017-06-10 ENCOUNTER — Other Ambulatory Visit: Payer: Self-pay | Admitting: Psychiatry

## 2017-06-15 ENCOUNTER — Ambulatory Visit: Payer: 59 | Admitting: Psychiatry

## 2017-06-20 ENCOUNTER — Ambulatory Visit: Payer: 59 | Admitting: Psychiatry

## 2017-06-20 DIAGNOSIS — J069 Acute upper respiratory infection, unspecified: Secondary | ICD-10-CM | POA: Diagnosis not present

## 2017-06-20 DIAGNOSIS — R05 Cough: Secondary | ICD-10-CM | POA: Diagnosis not present

## 2017-06-21 ENCOUNTER — Other Ambulatory Visit: Payer: Self-pay | Admitting: Psychiatry

## 2017-06-30 ENCOUNTER — Encounter

## 2017-06-30 ENCOUNTER — Ambulatory Visit: Payer: 59 | Admitting: Licensed Clinical Social Worker

## 2017-07-11 ENCOUNTER — Other Ambulatory Visit: Payer: Self-pay | Admitting: Psychiatry

## 2017-07-11 DIAGNOSIS — F3181 Bipolar II disorder: Secondary | ICD-10-CM

## 2017-08-08 ENCOUNTER — Ambulatory Visit: Payer: 59 | Admitting: Licensed Clinical Social Worker

## 2017-08-08 DIAGNOSIS — F3181 Bipolar II disorder: Secondary | ICD-10-CM | POA: Diagnosis not present

## 2017-08-11 NOTE — Progress Notes (Signed)
   THERAPIST PROGRESS NOTE  Session Time: 1hr  Participation Level: Active  Behavioral Response: CasualAlertDepressed  Type of Therapy: Individual Therapy  Treatment Goals addressed: Diagnosis: Bipolar  Interventions: CBT and Motivational Interviewing  Summary: Brittney Duran is a 47 y.o. female who presents with continued symptoms of her diagnosis.  Therapist met with Patient in an outpatient setting to assess current mood and assist with making progress towards goals through the use of therapeutic intervention. Therapist did a brief mood check, assessing anger, fear, disgust, excitement, happiness, and sadness.  Patient reports unfavorable mood most of the time.  Therapist cried while describing her marriage.  She reports at times she wants to separate from him.  Discussion of her sleep habits and her daily schedule/routine. 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  Diagnosis: Axis I: Bipolar, Depressed    Axis II: No diagnosis    Lubertha South, LCSW 08/09/2017

## 2017-08-22 ENCOUNTER — Encounter: Payer: Self-pay | Admitting: Psychiatry

## 2017-08-22 ENCOUNTER — Ambulatory Visit (INDEPENDENT_AMBULATORY_CARE_PROVIDER_SITE_OTHER): Payer: 59 | Admitting: Psychiatry

## 2017-08-22 ENCOUNTER — Other Ambulatory Visit: Payer: Self-pay

## 2017-08-22 VITALS — BP 109/74 | HR 74 | Temp 98.9°F | Wt 208.2 lb

## 2017-08-22 DIAGNOSIS — F3181 Bipolar II disorder: Secondary | ICD-10-CM

## 2017-08-22 DIAGNOSIS — F410 Panic disorder [episodic paroxysmal anxiety] without agoraphobia: Secondary | ICD-10-CM | POA: Diagnosis not present

## 2017-08-22 MED ORDER — SERTRALINE HCL 50 MG PO TABS: 50.0000 mg | ORAL_TABLET | Freq: Every day | ORAL | 0 refills | Status: DC

## 2017-08-22 MED ORDER — ZOLPIDEM TARTRATE 10 MG PO TABS: 5.0000 mg | ORAL_TABLET | Freq: Every evening | ORAL | 1 refills | Status: DC | PRN

## 2017-08-22 MED ORDER — ARIPIPRAZOLE 10 MG PO TABS: 5.0000 mg | ORAL_TABLET | Freq: Every day | ORAL | 0 refills | Status: DC

## 2017-08-22 MED ORDER — BUPROPION HCL 75 MG PO TABS: 75.0000 mg | ORAL_TABLET | Freq: Every morning | ORAL | 0 refills | Status: DC

## 2017-08-22 NOTE — Patient Instructions (Signed)
       Bupropion tablets (Depression/Mood Disorders) What is this medicine? BUPROPION (byoo PROE pee on) is used to treat depression. This medicine may be used for other purposes; ask your health care provider or pharmacist if you have questions. COMMON BRAND NAME(S): Wellbutrin What should I tell my health care provider before I take this medicine? They need to know if you have any of these conditions: -an eating disorder, such as anorexia or bulimia -bipolar disorder or psychosis -diabetes or high blood sugar, treated with medication -glaucoma -heart disease, previous heart attack, or irregular heart beat -head injury or brain tumor -high blood pressure -kidney or liver disease -seizures -suicidal thoughts or a previous suicide attempt -Tourette's syndrome -weight loss -an unusual or allergic reaction to bupropion, other medicines, foods, dyes, or preservatives -breast-feeding -pregnant or trying to become pregnant How should I use this medicine? Take this medicine by mouth with a glass of water. Follow the directions on the prescription label. You can take it with or without food. If it upsets your stomach, take it with food. Take your medicine at regular intervals. Do not take your medicine more often than directed. Do not stop taking this medicine suddenly except upon the advice of your doctor. Stopping this medicine too quickly may cause serious side effects or your condition may worsen. A special MedGuide will be given to you by the pharmacist with each prescription and refill. Be sure to read this information carefully each time. Talk to your pediatrician regarding the use of this medicine in children. Special care may be needed. Overdosage: If you think you have taken too much of this medicine contact a poison control center or emergency room at once. NOTE: This medicine is only for you. Do not share this medicine with others. What if I miss a dose? If you miss a dose,  take it as soon as you can. If it is less than four hours to your next dose, take only that dose and skip the missed dose. Do not take double or extra doses. What may interact with this medicine? Do not take this medicine with any of the following medications: -linezolid -MAOIs like Azilect, Carbex, Eldepryl, Marplan, Nardil, and Parnate -methylene blue (injected into a vein) -other medicines that contain bupropion like Zyban This medicine may also interact with the following medications: -alcohol -certain medicines for anxiety or sleep -certain medicines for blood pressure like metoprolol, propranolol -certain medicines for depression or psychotic disturbances -certain medicines for HIV or AIDS like efavirenz, lopinavir, nelfinavir, ritonavir -certain medicines for irregular heart beat like propafenone, flecainide -certain medicines for Parkinson's disease like amantadine, levodopa -certain medicines for seizures like carbamazepine, phenytoin, phenobarbital -cimetidine -clopidogrel -cyclophosphamide -digoxin -furazolidone -isoniazid -nicotine -orphenadrine -procarbazine -steroid medicines like prednisone or cortisone -stimulant medicines for attention disorders, weight loss, or to stay awake -tamoxifen -theophylline -thiotepa -ticlopidine -tramadol -warfarin This list may not describe all possible interactions. Give your health care provider a list of all the medicines, herbs, non-prescription drugs, or dietary supplements you use. Also tell them if you smoke, drink alcohol, or use illegal drugs. Some items may interact with your medicine. What should I watch for while using this medicine? Tell your doctor if your symptoms do not get better or if they get worse. Visit your doctor or health care professional for regular checks on your progress. Because it may take several weeks to see the full effects of this medicine, it is important to continue your treatment as prescribed by    your doctor. Patients and their families should watch out for new or worsening thoughts of suicide or depression. Also watch out for sudden changes in feelings such as feeling anxious, agitated, panicky, irritable, hostile, aggressive, impulsive, severely restless, overly excited and hyperactive, or not being able to sleep. If this happens, especially at the beginning of treatment or after a change in dose, call your health care professional. Avoid alcoholic drinks while taking this medicine. Drinking excessive alcoholic beverages, using sleeping or anxiety medicines, or quickly stopping the use of these agents while taking this medicine may increase your risk for a seizure. Do not drive or use heavy machinery until you know how this medicine affects you. This medicine can impair your ability to perform these tasks. Do not take this medicine close to bedtime. It may prevent you from sleeping. Your mouth may get dry. Chewing sugarless gum or sucking hard candy, and drinking plenty of water may help. Contact your doctor if the problem does not go away or is severe. What side effects may I notice from receiving this medicine? Side effects that you should report to your doctor or health care professional as soon as possible: -allergic reactions like skin rash, itching or hives, swelling of the face, lips, or tongue -breathing problems -changes in vision -confusion -elevated mood, decreased need for sleep, racing thoughts, impulsive behavior -fast or irregular heartbeat -hallucinations, loss of contact with reality -increased blood pressure -redness, blistering, peeling or loosening of the skin, including inside the mouth -seizures -suicidal thoughts or other mood changes -unusually weak or tired -vomiting Side effects that usually do not require medical attention (report to your doctor or health care professional if they continue or are bothersome): -constipation -headache -loss of  appetite -nausea -tremors -weight loss This list may not describe all possible side effects. Call your doctor for medical advice about side effects. You may report side effects to FDA at 1-800-FDA-1088. Where should I keep my medicine? Keep out of the reach of children. Store at room temperature between 20 and 25 degrees C (68 and 77 degrees F), away from direct sunlight and moisture. Keep tightly closed. Throw away any unused medicine after the expiration date. NOTE: This sheet is a summary. It may not cover all possible information. If you have questions about this medicine, talk to your doctor, pharmacist, or health care provider.  2018 Elsevier/Gold Standard (2015-06-27 13:44:21)  

## 2017-08-22 NOTE — Progress Notes (Signed)
[facility] MD OP Progress Note  08/22/2017 3:05 PM Brittney Duran  MRN:  [id number]  Chief Complaint: ' I am here for follow up.' Chief Complaint    Follow-up; Medication Refill     HPI: Brittney Duran is a 47 yr old [place] American female, married, unemployed, lives in [place], has a history of mood lability, anxiety, history of TIA, presented to the clinic today for a follow-up visit.  She today reports she continues to be depressed.  She reports she was depressed to the point that she did not feel like coming for appointments.  She reports however that she felt a little bit better the past month or so and hence decided to make an appointment with the therapist here, Ms. Kerby Nora.  Patient reports sadness, lack of energy, crying spells.  Patient also has a history of chronic suicidality.  Patient reports she does have it on and off however denies it now.  She reports she tries to cope by sleeping all day.  Patient also has been noncompliant with her medications.  She stopped taking the lithium after a few weeks, after it was prescribed.  Patient reports she had swelling of her face at that time and hence decided to stop it.  Patient also takes a lower dose of Abilify 5 mg even though she is prescribed 15 mg.  Patient reports she sometimes feels she is taking a lot of medication and decides to reduce the dosage herself.  Patient reports she would like to try Wellbutrin since her cousin is on it and is doing well.  She continues to struggle with sleep issues at night.  Patient reports she was doing well while taking Ambien.  She wants to continue the same.   Visit Diagnosis:    ICD-10-CM   1. Bipolar 2 disorder, major depressive episode (HCC) F31.81 sertraline (ZOLOFT) 50 MG tablet    ARIPiprazole (ABILIFY) 10 MG tablet    zolpidem (AMBIEN) 10 MG tablet  2. Panic disorder F41.0 sertraline (ZOLOFT) 50 MG tablet    Past Psychiatric History: Reviewed past psychiatric history from my progress note on  04/18/2017.  Past Medical History:  Past Medical History:  Diagnosis Date  . Anemia   . Anxiety    Panic attacks  . Complication of anesthesia    pt reports "local" wears off quickly  . Depression   . Environmental allergies   . Hemorrhoids   . Leaky heart valve   . Wears contact lenses     Past Surgical History:  Procedure Laterality Date  . Caesaran section  1989  . COLONOSCOPY WITH PROPOFOL N/A 10/07/2014   Procedure: COLONOSCOPY WITH PROPOFOL;  Surgeon: [name], MD;  Location: [facility];  Service: Endoscopy;  Laterality: N/A;  Latex  . HERNIA REPAIR  1978    Family Psychiatric History: Reviewed family psychiatric history from my progress note on 04/18/2017.  Family History:  Family History  Problem Relation Age of Onset  . Hypertension Mother   . Seizures Brother   . Pancreatic cancer Unknown   Substance abuse history: Denies   Social History: Reviewed social history from my progress note on 04/18/2017. Social History   Socioeconomic History  . Marital status: Married    Spouse name: Not on file  . Number of children: 5  . Years of education: 72  . Highest education level: Not on file  Occupational History  . Occupation: [employer]  Social Needs  . Financial resource strain: Not on file  . Food  insecurity:    Worry: Not on file    Inability: Not on file  . Transportation needs:    Medical: Not on file    Non-medical: Not on file  Tobacco Use  . Smoking status: Former Smoker    Years: 4.00  . Smokeless tobacco: Never Used  Substance and Sexual Activity  . Alcohol use: Yes    Alcohol/week: 2.4 oz    Types: 4 Glasses of wine per week    Comment: "socially"  . Drug use: No  . Sexual activity: Yes    Birth control/protection: IUD  Lifestyle  . Physical activity:    Days per week: Not on file    Minutes per session: Not on file  . Stress: Not on file  Relationships  . Social connections:    Talks on phone: Not on file    Gets together: Not  on file    Attends religious service: Not on file    Active member of club or organization: Not on file    Attends meetings of clubs or organizations: Not on file    Relationship status: Not on file  Other Topics Concern  . Not on file  Social History Narrative   Lives at home w/ her husband and children   Left-handed   Drinks 1 cup of coffee per day    Allergies:  Allergies  Allergen Reactions  . Penicillins Other (See Comments)    Unknown reaction Has patient had a PCN reaction causing immediate rash, facial/tongue/throat swelling, SOB or lightheadedness with hypotension: YES Has patient had a PCN reaction causing severe rash involving mucus membranes or skin necrosis: NO Has patient had a PCN reaction that required hospitalizationNO Has patient had a PCN reaction occurring within the last 10 years: NO If all of the above answers are "NO", then may proceed with Cephalosporin use.  . Shellfish Allergy Swelling    lips  . Latex Rash    Metabolic Disorder Labs: Lab Results  Component Value Date   HGBA1C 5.4 05/09/2015   MPG 108 05/09/2015   No results found for: PROLACTIN Lab Results  Component Value Date   CHOL 128 05/09/2015   TRIG 79 05/09/2015   HDL 41 05/09/2015   CHOLHDL 3.1 05/09/2015   VLDL 16 05/09/2015   LDLCALC 71 05/09/2015   No results found for: TSH  Therapeutic Level Labs: No results found for: LITHIUM No results found for: VALPROATE No components found for:  CBMZ  Current Medications: Current Outpatient Medications  Medication Sig Dispense Refill  . ARIPiprazole (ABILIFY) 10 MG tablet Take 0.5 tablets (5 mg total) by mouth daily. 90 tablet 0  . aspirin 81 MG tablet Take 162 mg by mouth daily.    Marland Kitchen. buPROPion (WELLBUTRIN) 75 MG tablet Take 1 tablet (75 mg total) by mouth every morning. 90 tablet 0  . cetirizine-pseudoephedrine (ZYRTEC-D) 5-120 MG per tablet Take 1 tablet by mouth 2 (two) times daily as needed for allergies.    . Cholecalciferol  (VITAMIN D3) 5000 units CAPS Take 1 capsule by mouth daily.    Marland Kitchen. Fe Cbn-Fe Gluc-FA-B12-C-DSS (FERRALET 90) 90-1 MG TABS     . hydrOXYzine (VISTARIL) 25 MG capsule Take 1 capsule (25 mg total) by mouth every 6 (six) hours as needed for anxiety (severe anxiety, insomnia). 120 capsule 1  . mupirocin ointment (BACTROBAN) 2 %     . PARAGARD INTRAUTERINE COPPER IUD IUD 1 each by Intrauterine route once.    . sertraline (ZOLOFT) 50  MG tablet Take 1 tablet (50 mg total) by mouth daily. 90 tablet 0  . vitamin B-12 (CYANOCOBALAMIN) 1000 MCG tablet Take 1,000 mcg by mouth daily as needed (energy). Reported on 05/08/2015    . zolpidem (AMBIEN) 10 MG tablet Take 0.5-1 tablets (5-10 mg total) by mouth at bedtime as needed for sleep. 15 tablet 1   No current facility-administered medications for this visit.      Musculoskeletal: Strength & Muscle Tone: within normal limits Gait & Station: normal Patient leans: N/A  Psychiatric Specialty Exam: Review of Systems  Psychiatric/Behavioral: Positive for depression. The patient is nervous/anxious and has insomnia.   All other systems reviewed and are negative.   Blood pressure 109/74, pulse 74, temperature 98.9 F (37.2 C), temperature source Oral, weight 208 lb 3.2 oz (94.4 kg).Body mass index is 32.61 kg/m.  General Appearance: Casual  Eye Contact:  Fair  Speech:  Clear and Coherent  Volume:  Normal  Mood:  Dysphoric  Affect:  Congruent  Thought Process:  Goal Directed and Descriptions of Associations: Intact  Orientation:  Full (Time, Place, and Person)  Thought Content: Logical   Suicidal Thoughts:  No, hx of chronic suicidality , denies now  Homicidal Thoughts:  No  Memory:  Immediate;   Fair Recent;   Fair Remote;   Fair  Judgement:  Fair  Insight:  Fair  Psychomotor Activity:  Normal  Concentration:  Concentration: Fair and Attention Span: Fair  Recall:  [employer] of Knowledge: Fair  Language: Fair  Akathisia:  No  Handed:  Right   AIMS (if indicated): 0  Assets:  Communication Skills Desire for Improvement Social Support  ADL's:  Intact  Cognition: WNL  Sleep:  Poor   Screenings:   Assessment and Plan: Brittney Duran is 47 year old African-American female who has a history of mood disorder, panic attacks, history of TIA, history of trauma, presented to the clinic today for a follow-up visit.  Patient continues to struggle with mood symptoms, sleep issues and chronic suicidality as well as is noncompliant with her medications.  Discussed medication changes as noted below.  Plan Bipolar disorder Reduce Zoloft to 50 mg p.o. daily. Start Wellbutrin 75 mg p.o. daily in the morning. Continue Abilify 5 mg p.o. daily, even though she was prescribed 15 mg she has been taking only 5 mg.  Panic disorder Continue Zoloft at the reduced dose of 50 mg p.o. daily Continue hydroxyzine 25 mg p.o. 3 times daily as needed Continue CBT with [name].  For insomnia Hydroxyzine 50 mg p.o. nightly as needed Ambien 5-10 mg p.o. nightly as needed  Discussed with patient about PHP/IOP referral.  Patient reports she wants to think about it.  For now she will continue psychotherapy visits with [name].  Discussed with patient importance of staying compliant with her medications as well as follow-up recommendations.  Discussed with patient to follow-up in clinic in 2 weeks or to call provider if she is unable to make that appointment.  More than 50 % of the time was spent for psychoeducation and supportive psychotherapy and care coordination.  This note was generated in part or whole with voice recognition software. Voice recognition is usually quite accurate but there are transcription errors that can and very often do occur. I apologize for any typographical errors that were not detected and corrected.        Jomarie Longs, MD 08/22/2017, 3:05 PM

## 2017-08-26 ENCOUNTER — Encounter: Payer: Self-pay | Admitting: Nurse Practitioner

## 2017-08-26 ENCOUNTER — Ambulatory Visit: Payer: 59 | Admitting: Nurse Practitioner

## 2017-08-26 ENCOUNTER — Other Ambulatory Visit
Admission: RE | Admit: 2017-08-26 | Discharge: 2017-08-26 | Disposition: A | Discharge status: Home / Self Care | Payer: 59 | Source: Ambulatory Visit | Attending: Nurse Practitioner | Admitting: Nurse Practitioner

## 2017-08-26 VITALS — BP 132/79 | HR 78 | Temp 98.5°F | Resp 16 | Ht 67.0 in | Wt 207.6 lb

## 2017-08-26 DIAGNOSIS — R609 Edema, unspecified: Secondary | ICD-10-CM | POA: Diagnosis not present

## 2017-08-26 DIAGNOSIS — F3181 Bipolar II disorder: Secondary | ICD-10-CM | POA: Insufficient documentation

## 2017-08-26 DIAGNOSIS — L700 Acne vulgaris: Secondary | ICD-10-CM

## 2017-08-26 DIAGNOSIS — F319 Bipolar disorder, unspecified: Secondary | ICD-10-CM | POA: Diagnosis not present

## 2017-08-26 DIAGNOSIS — T43205A Adverse effect of unspecified antidepressants, initial encounter: Secondary | ICD-10-CM | POA: Insufficient documentation

## 2017-08-26 DIAGNOSIS — R601 Generalized edema: Secondary | ICD-10-CM | POA: Diagnosis not present

## 2017-08-26 DIAGNOSIS — T50905A Adverse effect of unspecified drugs, medicaments and biological substances, initial encounter: Secondary | ICD-10-CM | POA: Diagnosis present

## 2017-08-26 DIAGNOSIS — F3177 Bipolar disorder, in partial remission, most recent episode mixed: Secondary | ICD-10-CM | POA: Insufficient documentation

## 2017-08-26 DIAGNOSIS — Y929 Unspecified place or not applicable: Secondary | ICD-10-CM | POA: Insufficient documentation

## 2017-08-26 LAB — COMPREHENSIVE METABOLIC PANEL
ALT: 11 U/L (ref 0–44)
AST: 17 U/L (ref 15–41)
Albumin: 4.1 g/dL (ref 3.5–5.0)
Alkaline Phosphatase: 47 U/L (ref 38–126)
Anion gap: 4 — ABNORMAL LOW (ref 5–15)
BUN: 9 mg/dL (ref 6–20)
CO2: 25 mmol/L (ref 22–32)
Calcium: 9.1 mg/dL (ref 8.9–10.3)
Chloride: 112 mmol/L — ABNORMAL HIGH (ref 98–111)
Creatinine, Ser: 0.7 mg/dL (ref 0.44–1.00)
GFR calc Af Amer: >60 mL/min (ref >60)
GFR calc non Af Amer: >60 mL/min (ref >60)
Glucose, Bld: 92 mg/dL (ref 70–99)
Potassium: 4.1 mmol/L (ref 3.5–5.1)
Sodium: 141 mmol/L (ref 135–145)
Total Bilirubin: 1.1 mg/dL (ref 0.3–1.2)
Total Protein: 7.2 g/dL (ref 6.5–8.1)

## 2017-08-26 LAB — TSH: TSH: 2.093 u[IU]/mL (ref 0.350–4.500)

## 2017-08-26 LAB — CBC
HCT: 37.2 % (ref 35.0–47.0)
Hemoglobin: 12.3 g/dL (ref 12.0–16.0)
MCH: 29 pg (ref 26.0–34.0)
MCHC: 33.1 g/dL (ref 32.0–36.0)
MCV: 87.7 fL (ref 80.0–100.0)
Platelets: 210 10*3/uL (ref 150–440)
RBC: 4.25 MIL/uL (ref 3.80–5.20)
RDW: 13.8 % (ref 11.5–14.5)
WBC: 4.1 10*3/uL (ref 3.6–11.0)

## 2017-08-26 LAB — T4, FREE: Free T4: 0.77 ng/dL — ABNORMAL LOW (ref 0.82–1.77)

## 2017-08-26 MED ORDER — HYDROCHLOROTHIAZIDE 12.5 MG PO TABS: 12.5000 mg | ORAL_TABLET | Freq: Every day | ORAL | 3 refills | Status: DC

## 2017-08-26 NOTE — Progress Notes (Signed)
[facility] [address] [place], [place] [address]  Internal MEDICINE  Office Visit Note  Patient Name: Brittney Duran  [id number]  [id number]  Date of Service: 08/26/2017   Pt is here for a sick visit.  Chief Complaint  Patient presents with  . Allergic Reaction    face is swelling pt stopped lithium few weeks ago still swelling      The patient states that about 4 weeks ago, she was started on lithium due to bipolar disorder. She states that she noticed new acne on her face a few hours after starting this medication. After several days, she noted swelling in the face and generalized weight gain. She took the medication for a total of 2 weeks. Talked to he psychiatrist and discontinued this medication. She has been off of this for about 2 weeks at this time. Acne has improved, but she still notices swelling in the face. When she discussed this psychiatry, she was advised to see her primary care provider.        Current Medication:  Outpatient Encounter Medications as of 08/26/2017  Medication Sig Note  . ARIPiprazole (ABILIFY) 10 MG tablet Take 0.5 tablets (5 mg total) by mouth daily.   Marland Kitchen aspirin 81 MG tablet Take 162 mg by mouth daily.   Marland Kitchen buPROPion (WELLBUTRIN) 75 MG tablet Take 1 tablet (75 mg total) by mouth every morning.   . cetirizine-pseudoephedrine (ZYRTEC-D) 5-120 MG per tablet Take 1 tablet by mouth 2 (two) times daily as needed for allergies.   . Cholecalciferol (VITAMIN D3) 5000 units CAPS Take 1 capsule by mouth daily.   Marland Kitchen Fe Cbn-Fe Gluc-FA-B12-C-DSS (FERRALET 90) 90-1 MG TABS  08/20/2015: Received from: External Pharmacy  . hydrOXYzine (VISTARIL) 25 MG capsule Take 1 capsule (25 mg total) by mouth every 6 (six) hours as needed for anxiety (severe anxiety, insomnia).   . mupirocin ointment (BACTROBAN) 2 %    . PARAGARD INTRAUTERINE COPPER IUD IUD 1 each by Intrauterine route once.   . sertraline (ZOLOFT) 50 MG tablet Take 1 tablet (50 mg total) by mouth  daily.   . vitamin B-12 (CYANOCOBALAMIN) 1000 MCG tablet Take 1,000 mcg by mouth daily as needed (energy). Reported on 05/08/2015   . zolpidem (AMBIEN) 10 MG tablet Take 0.5-1 tablets (5-10 mg total) by mouth at bedtime as needed for sleep.   . hydrochlorothiazide (HYDRODIURIL) 12.5 MG tablet Take 1 tablet (12.5 mg total) by mouth daily.    No facility-administered encounter medications on file as of 08/26/2017.       Medical History: Past Medical History:  Diagnosis Date  . Anemia   . Anxiety    Panic attacks  . Complication of anesthesia    pt reports "local" wears off quickly  . Depression   . Environmental allergies   . Hemorrhoids   . Leaky heart valve   . Wears contact lenses      Vital Signs: BP 132/79   Pulse 78   Temp 98.5 F (36.9 C)   Resp 16   Ht 5\' 7"  (1.702 m)   Wt 207 lb 9.6 oz (94.2 kg)   SpO2 99%   BMI 32.51 kg/m    Review of Systems  Constitutional: Positive for fatigue. Negative for activity change, chills and unexpected weight change.  HENT: Negative for congestion, postnasal drip, rhinorrhea, sneezing and sore throat.   Eyes: Negative.  Negative for redness.  Respiratory: Negative for cough, chest tightness, shortness of breath and wheezing.  Cardiovascular: Negative for chest pain and palpitations.       Facial swelling.   Gastrointestinal: Negative for abdominal pain, constipation, diarrhea, nausea and vomiting.  Endocrine: Negative for cold intolerance, heat intolerance, polydipsia, polyphagia and polyuria.  Genitourinary: Negative.  Negative for dysuria and frequency.  Musculoskeletal: Negative for arthralgias, back pain, joint swelling and neck pain.  Skin: Negative for rash.       New acne  Allergic/Immunologic: Negative for environmental allergies.  Neurological: Negative for dizziness, tremors, numbness and headaches.  Hematological: Negative for adenopathy. Does not bruise/bleed easily.  Psychiatric/Behavioral: Positive for dysphoric  mood. Negative for behavioral problems (Depression), sleep disturbance and suicidal ideas. The patient is not nervous/anxious.     Physical Exam  Constitutional: She is oriented to person, place, and time. She appears well-developed and well-nourished. No distress.  HENT:  Head: Normocephalic and atraumatic.  Nose: Nose normal.  Mouth/Throat: Oropharynx is clear and moist. No oropharyngeal exudate.  Mild facial swelling.   Eyes: Pupils are equal, round, and reactive to light. Conjunctivae and EOM are normal.  Neck: Normal range of motion. Neck supple. No JVD present. No tracheal deviation present. No thyromegaly present.  Cardiovascular: Normal rate, regular rhythm, normal heart sounds and intact distal pulses. Exam reveals no gallop and no friction rub.  No murmur heard. Pulmonary/Chest: Effort normal and breath sounds normal. No respiratory distress. She has no wheezes. She has no rales. She exhibits no tenderness.  Abdominal: Soft. Bowel sounds are normal. There is no tenderness.  Musculoskeletal: Normal range of motion.  Lymphadenopathy:    She has no cervical adenopathy.  Neurological: She is alert and oriented to person, place, and time. No cranial nerve deficit.  Skin: Skin is warm and dry. Capillary refill takes less than 2 seconds. She is not diaphoretic.  Mild facial acne.   Psychiatric: Her speech is normal and behavior is normal. Judgment and thought content normal. Cognition and memory are normal. She exhibits a depressed mood.  Nursing note and vitals reviewed.  Assessment/Plan: 1. Adverse reaction to antidepressant drug, initial encounter Believe reaction due to lithium. She has now stopped medication, however, symptoms continue. Will check CMP, CBC, TSH, and free t4  2. Generalized edema - hydrochlorothiazide (HYDRODIURIL) 12.5 MG tablet; Take 1 tablet (12.5 mg total) by mouth daily.  Dispense: 30 tablet; Refill: 3  3. Acne vulgaris Likely due to lithium. Will  monitor.   4. Bipolar depression (HCC) Continue regular visits with psychiatry for treatment.   General Counseling: Aimy verbalizes understanding of the findings of todays visit and agrees with plan of treatment. I have discussed any further diagnostic evaluation that may be needed or ordered today. We also reviewed her medications today. she has been encouraged to call the office with any questions or concerns that should arise related to todays visit.    Counseling:  This patient was seen by Vincent GrosHeather Quintan Saldivar FNP Collaboration with Dr Lyndon CodeFozia M Khan as a part of collaborative care agreement   Meds ordered this encounter  Medications  . hydrochlorothiazide (HYDRODIURIL) 12.5 MG tablet    Sig: Take 1 tablet (12.5 mg total) by mouth daily.    Dispense:  30 tablet    Refill:  3    Order Specific Question:   Supervising Provider    Answer:   Lyndon CodeKHAN, FOZIA M [[id number]]    Time spent: 15 Minutes

## 2017-08-29 ENCOUNTER — Telehealth: Payer: Self-pay

## 2017-08-29 NOTE — Telephone Encounter (Signed)
-----   Message from Carlean JewsHeather E Boscia, NP sent at 08/29/2017  8:46 AM EDT ----- Please let the patient know that her recent labs look good. Thanks.

## 2017-08-29 NOTE — Telephone Encounter (Signed)
PT WAS NOTIFIED LABS LOOK GOOD.

## 2017-08-31 ENCOUNTER — Ambulatory Visit: Payer: Self-pay | Admitting: Licensed Clinical Social Worker

## 2017-09-07 ENCOUNTER — Encounter: Payer: Self-pay | Admitting: Nurse Practitioner

## 2017-09-08 ENCOUNTER — Other Ambulatory Visit: Payer: Self-pay

## 2017-09-08 ENCOUNTER — Encounter: Payer: Self-pay | Admitting: Emergency Medicine

## 2017-09-08 ENCOUNTER — Emergency Department: Payer: 59

## 2017-09-08 DIAGNOSIS — R200 Anesthesia of skin: Secondary | ICD-10-CM | POA: Insufficient documentation

## 2017-09-08 DIAGNOSIS — F17200 Nicotine dependence, unspecified, uncomplicated: Secondary | ICD-10-CM | POA: Insufficient documentation

## 2017-09-08 DIAGNOSIS — Z79899 Other long term (current) drug therapy: Secondary | ICD-10-CM | POA: Diagnosis not present

## 2017-09-08 DIAGNOSIS — R51 Headache: Secondary | ICD-10-CM | POA: Diagnosis not present

## 2017-09-08 DIAGNOSIS — M5430 Sciatica, unspecified side: Secondary | ICD-10-CM

## 2017-09-08 DIAGNOSIS — M5432 Sciatica, left side: Secondary | ICD-10-CM | POA: Diagnosis not present

## 2017-09-08 DIAGNOSIS — Z7982 Long term (current) use of aspirin: Secondary | ICD-10-CM | POA: Insufficient documentation

## 2017-09-08 DIAGNOSIS — M545 Low back pain: Secondary | ICD-10-CM | POA: Diagnosis not present

## 2017-09-08 DIAGNOSIS — M25552 Pain in left hip: Secondary | ICD-10-CM | POA: Diagnosis not present

## 2017-09-08 DIAGNOSIS — Z91040 Latex allergy status: Secondary | ICD-10-CM | POA: Insufficient documentation

## 2017-09-08 DIAGNOSIS — R0602 Shortness of breath: Secondary | ICD-10-CM | POA: Diagnosis not present

## 2017-09-08 DIAGNOSIS — M5442 Lumbago with sciatica, left side: Secondary | ICD-10-CM | POA: Diagnosis not present

## 2017-09-08 HISTORY — DX: Sciatica, unspecified side: M54.30

## 2017-09-08 LAB — CBC
HCT: 34.7 % — ABNORMAL LOW (ref 35.0–47.0)
Hemoglobin: 11.5 g/dL — ABNORMAL LOW (ref 12.0–16.0)
MCH: 28.7 pg (ref 26.0–34.0)
MCHC: 33.2 g/dL (ref 32.0–36.0)
MCV: 86.5 fL (ref 80.0–100.0)
Platelets: 210 10*3/uL (ref 150–440)
RBC: 4.01 MIL/uL (ref 3.80–5.20)
RDW: 13.5 % (ref 11.5–14.5)
WBC: 5 10*3/uL (ref 3.6–11.0)

## 2017-09-08 LAB — BASIC METABOLIC PANEL
Anion gap: 5 (ref 5–15)
BUN: 9 mg/dL (ref 6–20)
CO2: 25 mmol/L (ref 22–32)
Calcium: 9.5 mg/dL (ref 8.9–10.3)
Chloride: 108 mmol/L (ref 98–111)
Creatinine, Ser: 0.68 mg/dL (ref 0.44–1.00)
GFR calc Af Amer: >60 mL/min (ref >60)
GFR calc non Af Amer: >60 mL/min (ref >60)
Glucose, Bld: 89 mg/dL (ref 70–99)
Potassium: 4.2 mmol/L (ref 3.5–5.1)
Sodium: 138 mmol/L (ref 135–145)

## 2017-09-08 LAB — POCT PREGNANCY, URINE: Preg Test, Ur: NEGATIVE

## 2017-09-08 LAB — TROPONIN I: Troponin I: <0.03 ng/mL (ref <0.03)

## 2017-09-08 NOTE — ED Triage Notes (Addendum)
Pt to triage via wheelchair.  Pt reports numbness on left side of body, headache, pain in left hip area and dizziness and sob.  No chest pain.  Pt alert  Speech clear.

## 2017-09-09 ENCOUNTER — Ambulatory Visit: Payer: 59 | Admitting: Nurse Practitioner

## 2017-09-09 ENCOUNTER — Emergency Department
Admission: EM | Admit: 2017-09-09 | Discharge: 2017-09-09 | Disposition: A | Discharge status: Home / Self Care | Payer: 59 | Attending: Emergency Medicine | Admitting: Emergency Medicine

## 2017-09-09 ENCOUNTER — Emergency Department: Payer: 59

## 2017-09-09 ENCOUNTER — Encounter: Payer: Self-pay | Admitting: Nurse Practitioner

## 2017-09-09 VITALS — BP 118/55 | HR 71 | Resp 16 | Ht 67.0 in | Wt 205.4 lb

## 2017-09-09 DIAGNOSIS — R51 Headache: Secondary | ICD-10-CM | POA: Diagnosis not present

## 2017-09-09 DIAGNOSIS — M545 Low back pain: Secondary | ICD-10-CM | POA: Diagnosis not present

## 2017-09-09 DIAGNOSIS — Z1231 Encounter for screening mammogram for malignant neoplasm of breast: Secondary | ICD-10-CM | POA: Diagnosis not present

## 2017-09-09 DIAGNOSIS — R300 Dysuria: Secondary | ICD-10-CM

## 2017-09-09 DIAGNOSIS — Z1239 Encounter for other screening for malignant neoplasm of breast: Secondary | ICD-10-CM

## 2017-09-09 DIAGNOSIS — Z0001 Encounter for general adult medical examination with abnormal findings: Secondary | ICD-10-CM

## 2017-09-09 DIAGNOSIS — R635 Abnormal weight gain: Secondary | ICD-10-CM

## 2017-09-09 DIAGNOSIS — M25552 Pain in left hip: Secondary | ICD-10-CM | POA: Diagnosis not present

## 2017-09-09 DIAGNOSIS — M5432 Sciatica, left side: Secondary | ICD-10-CM

## 2017-09-09 DIAGNOSIS — N390 Urinary tract infection, site not specified: Secondary | ICD-10-CM

## 2017-09-09 LAB — TROPONIN I: Troponin I: <0.03 ng/mL (ref <0.03)

## 2017-09-09 MED ORDER — DIAZEPAM 2 MG PO TABS: 2.0000 mg | ORAL_TABLET | Freq: Three times a day (TID) | ORAL | 0 refills | Status: DC | PRN

## 2017-09-09 MED ORDER — KETOROLAC TROMETHAMINE 30 MG/ML IJ SOLN
15.0000 mg | Freq: Once | INTRAMUSCULAR | Status: AC
  Administered 2017-09-09: 15 mg via INTRAVENOUS
  Filled 2017-09-09: qty 1

## 2017-09-09 MED ORDER — IBUPROFEN 800 MG PO TABS: 800.0000 mg | ORAL_TABLET | Freq: Three times a day (TID) | ORAL | 0 refills | Status: AC | PRN

## 2017-09-09 MED ORDER — PHENTERMINE HCL 37.5 MG PO TABS: 37.5000 mg | ORAL_TABLET | Freq: Every day | ORAL | 1 refills | Status: DC

## 2017-09-09 MED ORDER — METHYLPREDNISOLONE 4 MG PO TBPK: ORAL_TABLET | ORAL | 0 refills | Status: DC

## 2017-09-09 MED ORDER — DIAZEPAM 2 MG PO TABS
2.0000 mg | ORAL_TABLET | Freq: Once | ORAL | Status: AC
  Administered 2017-09-09: 2 mg via ORAL
  Filled 2017-09-09: qty 1

## 2017-09-09 NOTE — Discharge Instructions (Addendum)
1.  You may take medicines as needed for pain and muscle spasms (Motrin/Valium #15). 2.  Take steroid taper as prescribed (Medrol Dosepak). 3.  Return to the ER for worsening symptoms, leg weakness, losing control of your bowel or bladder, or other concerns.

## 2017-09-09 NOTE — Progress Notes (Signed)
[facility] [place], [place] [address]  Internal MEDICINE  Office Visit Note  Patient Name: Brittney Duran  [id number]  [id number]  Date of Service: 09/19/2017     Chief Complaint  Patient presents with  . Annual Exam  . Fatigue    review labs     The patient reports moderate fatigue. She does continue to take phentermine, generally takes 1/2 tablet daily. She has taken a break from this for a few weeks as her psychiatrist makes changes to her psychiatric medications. Since restarting, she has lost 3 pounds since her last visit. She has no negative side effects or issues with the medication to report. She would like to continue with this to help her lose weight.     Current Medication: Outpatient Encounter Medications as of 09/09/2017  Medication Sig Note  . ARIPiprazole (ABILIFY) 10 MG tablet Take 0.5 tablets (5 mg total) by mouth daily.   Marland Kitchen aspirin 81 MG tablet Take 162 mg by mouth daily.   Marland Kitchen buPROPion (WELLBUTRIN) 75 MG tablet Take 1 tablet (75 mg total) by mouth every morning.   . cetirizine-pseudoephedrine (ZYRTEC-D) 5-120 MG per tablet Take 1 tablet by mouth 2 (two) times daily as needed for allergies.   . Cholecalciferol (VITAMIN D3) 5000 units CAPS Take 1 capsule by mouth daily.   . diazepam (VALIUM) 2 MG tablet Take 1 tablet (2 mg total) by mouth every 8 (eight) hours as needed for muscle spasms.   . Fe Cbn-Fe Gluc-FA-B12-C-DSS (FERRALET 90) 90-1 MG TABS  08/20/2015: Received from: External Pharmacy  . hydrochlorothiazide (HYDRODIURIL) 12.5 MG tablet Take 1 tablet (12.5 mg total) by mouth daily.   . hydrOXYzine (VISTARIL) 25 MG capsule Take 1 capsule (25 mg total) by mouth every 6 (six) hours as needed for anxiety (severe anxiety, insomnia).   Marland Kitchen ibuprofen (ADVIL,MOTRIN) 800 MG tablet Take 1 tablet (800 mg total) by mouth every 8 (eight) hours as needed for moderate pain.   . methylPREDNISolone (MEDROL DOSEPAK) 4 MG TBPK tablet Take as directed   .  mupirocin ointment (BACTROBAN) 2 %    . PARAGARD INTRAUTERINE COPPER IUD IUD 1 each by Intrauterine route once.   . sertraline (ZOLOFT) 50 MG tablet Take 1 tablet (50 mg total) by mouth daily.   . vitamin B-12 (CYANOCOBALAMIN) 1000 MCG tablet Take 1,000 mcg by mouth daily as needed (energy). Reported on 05/08/2015   . zolpidem (AMBIEN) 10 MG tablet Take 0.5-1 tablets (5-10 mg total) by mouth at bedtime as needed for sleep.   . nitrofurantoin, macrocrystal-monohydrate, (MACROBID) 100 MG capsule Take 1 capsule (100 mg total) by mouth 2 (two) times daily.   . phentermine (ADIPEX-P) 37.5 MG tablet Take 1 tablet (37.5 mg total) by mouth daily before breakfast.    No facility-administered encounter medications on file as of 09/09/2017.     Surgical History: Past Surgical History:  Procedure Laterality Date  . Caesaran section  1989  . COLONOSCOPY WITH PROPOFOL N/A 10/07/2014   Procedure: COLONOSCOPY WITH PROPOFOL;  Surgeon: Lucilla Lame, MD;  Location: [place];  Service: Endoscopy;  Laterality: N/A;  Latex  . HERNIA REPAIR  1978    Medical History: Past Medical History:  Diagnosis Date  . Anemia   . Anxiety    Panic attacks  . Complication of anesthesia    pt reports "local" wears off quickly  . Depression   . Environmental allergies   . Hemorrhoids   . Leaky heart valve   .  Sciatica 09/08/2017  . Wears contact lenses     Family History: Family History  Problem Relation Age of Onset  . Hypertension Mother   . Seizures Brother   . Pancreatic cancer Unknown       Review of Systems  Constitutional: Positive for fatigue. Negative for activity change, chills and unexpected weight change.       Three pound weight loss since her last visit.   HENT: Negative for congestion, postnasal drip, rhinorrhea, sneezing and sore throat.   Eyes: Negative.  Negative for redness.  Respiratory: Negative for cough, chest tightness, shortness of breath and wheezing.   Cardiovascular:  Negative for chest pain and palpitations.       Facial swelling has resolved.   Gastrointestinal: Negative for abdominal pain, constipation, diarrhea, nausea and vomiting.  Endocrine: Negative for cold intolerance, heat intolerance, polydipsia, polyphagia and polyuria.  Genitourinary: Negative.  Negative for dysuria and frequency.  Musculoskeletal: Negative for arthralgias, back pain, joint swelling and neck pain.  Skin: Negative for rash.       New acne  Allergic/Immunologic: Negative for environmental allergies.  Neurological: Negative for dizziness, tremors, numbness and headaches.  Hematological: Negative for adenopathy. Does not bruise/bleed easily.  Psychiatric/Behavioral: Positive for dysphoric mood. Negative for behavioral problems (Depression), sleep disturbance and suicidal ideas. The patient is nervous/anxious.        Patient sees psychiatry on routine basis.      Today's Vitals   09/09/17 1049  BP: (!) 118/55  Pulse: 71  Resp: 16  SpO2: 96%  Weight: 205 lb 6.4 oz (93.2 kg)  Height: 5' 7"  (1.702 m)     Physical Exam  Constitutional: She is oriented to person, place, and time. She appears well-developed and well-nourished. No distress.  HENT:  Head: Normocephalic and atraumatic.  Nose: Nose normal.  Mouth/Throat: Oropharynx is clear and moist. No oropharyngeal exudate.  Eyes: Pupils are equal, round, and reactive to light. Conjunctivae and EOM are normal.  Neck: Normal range of motion. Neck supple. No JVD present. No tracheal deviation present. No thyromegaly present.  Cardiovascular: Normal rate, regular rhythm, normal heart sounds and intact distal pulses. Exam reveals no gallop and no friction rub.  No murmur heard. Pulmonary/Chest: Effort normal and breath sounds normal. No respiratory distress. She has no wheezes. She has no rales. She exhibits no tenderness. Right breast exhibits no inverted nipple, no mass, no nipple discharge, no skin change and no tenderness.  Left breast exhibits no inverted nipple, no mass, no nipple discharge, no skin change and no tenderness.  Abdominal: Soft. Bowel sounds are normal. There is no tenderness.  Musculoskeletal: Normal range of motion.  Lymphadenopathy:    She has no cervical adenopathy.  Neurological: She is alert and oriented to person, place, and time. No cranial nerve deficit.  Skin: Skin is warm and dry. Capillary refill takes less than 2 seconds. She is not diaphoretic.  Mild facial acne.   Psychiatric: Her speech is normal and behavior is normal. Judgment and thought content normal. Cognition and memory are normal. She exhibits a depressed mood.  Nursing note and vitals reviewed.    LABS: Recent Results (from the past 2160 hour(s))  CBC     Status: None   Collection Time: 08/26/17 12:11 PM  Result Value Ref Range   WBC 4.1 3.6 - 11.0 K/uL   RBC 4.25 3.80 - 5.20 MIL/uL   Hemoglobin 12.3 12.0 - 16.0 g/dL   HCT 37.2 35.0 - 47.0 %  MCV 87.7 80.0 - 100.0 fL   MCH 29.0 26.0 - 34.0 pg   MCHC 33.1 32.0 - 36.0 g/dL   RDW 13.8 11.5 - 14.5 %   Platelets 210 150 - 440 K/uL    Comment: Performed at [facility], [address] [place], [place] [address]  Comprehensive metabolic panel     Status: Abnormal   Collection Time: 08/26/17 12:11 PM  Result Value Ref Range   Sodium 141 135 - 145 mmol/L   Potassium 4.1 3.5 - 5.1 mmol/L   Chloride 112 (H) 98 - 111 mmol/L   CO2 25 22 - 32 mmol/L   Glucose, Bld 92 70 - 99 mg/dL   BUN 9 6 - 20 mg/dL   Creatinine, Ser 0.70 0.44 - 1.00 mg/dL   Calcium 9.1 8.9 - 10.3 mg/dL   Total Protein 7.2 6.5 - 8.1 g/dL   Albumin 4.1 3.5 - 5.0 g/dL   AST 17 15 - 41 U/L   ALT 11 0 - 44 U/L   Alkaline Phosphatase 47 38 - 126 U/L   Total Bilirubin 1.1 0.3 - 1.2 mg/dL   GFR calc non Af Amer >60 >60 mL/min   GFR calc Af Amer >60 >60 mL/min    Comment: (NOTE) The eGFR has been calculated using the CKD EPI equation. This calculation has not been validated in all  clinical situations. eGFR's persistently <60 mL/min signify possible Chronic Kidney Disease.    Anion gap 4 (L) 5 - 15    Comment: Performed at [facility], [address] [place], [place] [address]  TSH     Status: None   Collection Time: 08/26/17 12:11 PM  Result Value Ref Range   TSH 2.093 0.350 - 4.500 uIU/mL    Comment: Performed by a 3rd Generation assay with a functional sensitivity of <=0.01 uIU/mL. Performed at [facility], [address] [place], [place] [address]   T4, free     Status: Abnormal   Collection Time: 08/26/17 12:11 PM  Result Value Ref Range   Free T4 0.77 (L) 0.82 - 1.77 ng/dL    Comment: (NOTE) Biotin ingestion may interfere with free T4 tests. If the results are inconsistent with the TSH level, previous test results, or the clinical presentation, then consider biotin interference. If needed, order repeat testing after stopping biotin. Performed at [facility], [address] [place], [place] [address]   Basic metabolic panel     Status: None   Collection Time: 09/08/17  9:55 PM  Result Value Ref Range   Sodium 138 135 - 145 mmol/L   Potassium 4.2 3.5 - 5.1 mmol/L   Chloride 108 98 - 111 mmol/L   CO2 25 22 - 32 mmol/L   Glucose, Bld 89 70 - 99 mg/dL   BUN 9 6 - 20 mg/dL   Creatinine, Ser 0.68 0.44 - 1.00 mg/dL   Calcium 9.5 8.9 - 10.3 mg/dL   GFR calc non Af Amer >60 >60 mL/min   GFR calc Af Amer >60 >60 mL/min    Comment: (NOTE) The eGFR has been calculated using the CKD EPI equation. This calculation has not been validated in all clinical situations. eGFR's persistently <60 mL/min signify possible Chronic Kidney Disease.    Anion gap 5 5 - 15    Comment: Performed at [facility], [address] [place], [place] [address]  CBC     Status: Abnormal   Collection Time: 09/08/17  9:55 PM  Result Value Ref Range   WBC 5.0 3.6 - 11.0 K/uL   RBC 4.01 3.80 - 5.20 MIL/uL   Hemoglobin 11.5 (L) 12.0 -  16.0 g/dL   HCT 34.7 (L) 35.0 - 47.0 %   MCV 86.5 80.0 - 100.0 fL   MCH 28.7 26.0 - 34.0 pg   MCHC 33.2 32.0 - 36.0 g/dL   RDW 13.5 11.5 - 14.5 %   Platelets 210 150 - 440 K/uL    Comment: Performed at [facility], [address] [place], [place] [address]  Troponin I     Status: None   Collection Time: 09/08/17  9:55 PM  Result Value Ref Range   Troponin I <0.03 <0.03 ng/mL    Comment: Performed at [facility], [address] [place], [place] [address]  Pregnancy, urine POC     Status: None   Collection Time: 09/08/17 10:04 PM  Result Value Ref Range   Preg Test, Ur NEGATIVE NEGATIVE    Comment:        THE SENSITIVITY OF THIS METHODOLOGY IS >24 mIU/mL   Troponin I     Status: None   Collection Time: 09/09/17  1:20 AM  Result Value Ref Range   Troponin I <0.03 <0.03 ng/mL    Comment: Performed at [facility], [address] [place], [place] [address]  UA/M w/rflx Culture, Routine     Status: Abnormal   Collection Time: 09/09/17 10:30 AM  Result Value Ref Range   Specific Gravity, UA      >=1.030 (A) 1.005 - 1.030   pH, UA 6.5 5.0 - 7.5   Color, UA Yellow Yellow   Appearance Ur Cloudy (A) Clear   Leukocytes, UA Negative Negative   Protein, UA 1+ (A) Negative/Trace   Glucose, UA Negative Negative   Ketones, UA Trace (A) Negative   RBC, UA Negative Negative   Bilirubin, UA Negative Negative   Urobilinogen, Ur 1.0 0.2 - 1.0 mg/dL   Nitrite, UA Negative Negative   Microscopic Examination See below:     Comment: Microscopic was indicated and was performed.   Urinalysis Reflex Comment     Comment: This specimen has reflexed to a Urine Culture.  Microscopic Examination     Status: Abnormal   Collection Time: 09/09/17 10:30 AM  Result Value Ref Range   WBC, UA 0-5 0 - 5 /hpf   RBC, UA 11-30 (A) 0 - 2 /hpf   Epithelial Cells (non renal) >10 (A) 0 - 10 /hpf   Casts None seen None seen /lpf   Mucus, UA Present Not Estab.   Bacteria, UA Many  (A) None seen/Few  Urine Culture, Reflex     Status: Abnormal   Collection Time: 09/09/17 10:30 AM  Result Value Ref Range   Urine Culture, Routine Final report (A)    Organism ID, Bacteria Citrobacter koseri (A)     Comment: Greater than 100,000 colony forming units per mL   ORGANISM ID, BACTERIA Comment     Comment: Mixed urogenital flora 50,000-100,000 colony forming units per mL    Antimicrobial Susceptibility Comment     Comment:       ** S = Susceptible; I = Intermediate; R = Resistant **                    P = Positive; N = Negative             MICS are expressed in micrograms per  mL    Antibiotic                 RSLT#1    RSLT#2    RSLT#3    RSLT#4 Amoxicillin/Clavulanic Acid    S Cefepime                       S Ceftriaxone                    S Cefuroxime                     S Ciprofloxacin                  S Ertapenem                      S Gentamicin                     S Imipenem                       S Levofloxacin                   S Meropenem                      S Nitrofurantoin                 S Piperacillin/Tazobactam        S Tetracycline                   S Tobramycin                     S Trimethoprim/Sulfa             S     Assessment/Plan: 1. Encounter for general adult medical examination with abnormal findings Annual wellness visit today  2. Urinary tract infection without hematuria, site unspecified Will treat with macrobid 171m twice daily for 10 days. Send urine for culture and sensitivity and adjust treatment as indicated.  - nitrofurantoin, macrocrystal-monohydrate, (MACROBID) 100 MG capsule; Take 1 capsule (100 mg total) by mouth 2 (two) times daily.  Dispense: 20 capsule; Refill: 0  3. Abnormal weight gain May continue phentermine 37.544mtablets daily. Limit calorie intake to 1200-1500 calories per day. Recommend she participate in low-impact, cardiovascular activity, three to four days for 20 to 30 minutes.  - phentermine (ADIPEX-P) 37.5  MG tablet; Take 1 tablet (37.5 mg total) by mouth daily before breakfast.  Dispense: 30 tablet; Refill: 1  4. Screening for breast cancer - MM DIGITAL SCREENING BILATERAL; Future  5. Dysuria - UA/M w/rflx Culture, Routine  General Counseling: Ersa verbalizes understanding of the findings of todays visit and agrees with plan of treatment. I have discussed any further diagnostic evaluation that may be needed or ordered today. We also reviewed her medications today. she has been encouraged to call the office with any questions or concerns that should arise related to todays visit.    Counseling:   There is a liability release in patients' chart. There has been a 10 minute discussion about the side effects including but not limited to elevated blood pressure, anxiety, lack of sleep and dry mouth. Pt understands and will like to start/continue on appetite suppressant at this time. There will be one month RX given at the time of visit with  proper follow up. Nova diet plan with restricted calories is given to the pt. Pt understands and agrees with  plan of treatment  This patient was seen by Leretha Pol FNP Collaboration with Dr Lavera Guise as a part of collaborative care agreement  Orders Placed This Encounter  Procedures  . Microscopic Examination  . Urine Culture, Reflex  . MM DIGITAL SCREENING BILATERAL  . UA/M w/rflx Culture, Routine    Meds ordered this encounter  Medications  . phentermine (ADIPEX-P) 37.5 MG tablet    Sig: Take 1 tablet (37.5 mg total) by mouth daily before breakfast.    Dispense:  30 tablet    Refill:  1    Order Specific Question:   Supervising Provider    Answer:   Lavera Guise [place]  . nitrofurantoin, macrocrystal-monohydrate, (MACROBID) 100 MG capsule    Sig: Take 1 capsule (100 mg total) by mouth 2 (two) times daily.    Dispense:  20 capsule    Refill:  0    Order Specific Question:   Supervising Provider    Answer:   Lavera Guise [[id number]]     Time spent: [place], MD  Internal Medicine

## 2017-09-09 NOTE — ED Provider Notes (Signed)
[facility] Emergency Department Provider Note   ____________________________________________   First MD Initiated Contact with Patient 09/09/17 0102     (approximate)  I have reviewed the triage vital signs and the nursing notes.   HISTORY  Chief Complaint Numbness    HPI Brittney Duran is a 47 y.o. female who presents to the ED from home with multiple medical complaints.  Chiefly, patient complains of nontraumatic left hip pain x2 weeks.  Tonight, her hip pain was so severe that it caused her lower left side to go numb, caused her to have posterior headache, dizziness and shortness of breath.  She also wonders if her IUD is displaced because of her left hip pain because her girlfriend had a similar thing happened to her.  Denies recent fever, chills, chest pain, abdominal pain, nausea, vomiting, dysuria, diarrhea.  2 weeks ago patient remembers she was vacuuming her steps, having to bend over a lot and that was when her hip pain started.  Denies bowel or bladder incontinence.  Denies extremity weakness.  Took 6 baby aspirin prior to arrival because she was concerned she was having a stroke.   Past Medical History:  Diagnosis Date  . Anemia   . Anxiety    Panic attacks  . Complication of anesthesia    pt reports "local" wears off quickly  . Depression   . Environmental allergies   . Hemorrhoids   . Leaky heart valve   . Wears contact lenses     Patient Active Problem List   Diagnosis Date Noted  . Adverse reaction to antidepressant drug, initial encounter 08/26/2017  . Generalized edema 08/26/2017  . Acne vulgaris 08/26/2017  . Bipolar depression (HCC) 08/26/2017  . MDD (major depressive disorder), recurrent episode, moderate (HCC) 08/20/2015  . Panic disorder with agoraphobia and moderate panic attacks 08/20/2015  . Seizure-like activity (HCC)   . Muscle spasm   . TIA (transient ischemic attack) 05/08/2015  . UTI (urinary tract infection)  05/08/2015  . Seizures (HCC) 05/08/2015  . Anemia   . Blood in stool   . Anxiety 10/01/2014  . Vitamin D deficiency 10/01/2014    Past Surgical History:  Procedure Laterality Date  . Caesaran section  1989  . COLONOSCOPY WITH PROPOFOL N/A 10/07/2014   Procedure: COLONOSCOPY WITH PROPOFOL;  Surgeon: [name], MD;  Location: [facility];  Service: Endoscopy;  Laterality: N/A;  Latex  . HERNIA REPAIR  1978    Prior to Admission medications   Medication Sig Start Date End Date Taking? Authorizing Provider  ARIPiprazole (ABILIFY) 10 MG tablet Take 0.5 tablets (5 mg total) by mouth daily. 08/22/17   Jomarie Longs, MD  aspirin 81 MG tablet Take 162 mg by mouth daily.    [provider]  buPROPion (WELLBUTRIN) 75 MG tablet Take 1 tablet (75 mg total) by mouth every morning. 08/22/17   Jomarie Longs, MD  cetirizine-pseudoephedrine (ZYRTEC-D) 5-120 MG per tablet Take 1 tablet by mouth 2 (two) times daily as needed for allergies.    [provider]  Cholecalciferol (VITAMIN D3) 5000 units CAPS Take 1 capsule by mouth daily.    [provider]  diazepam (VALIUM) 2 MG tablet Take 1 tablet (2 mg total) by mouth every 8 (eight) hours as needed for muscle spasms. 09/09/17   Irean Hong, MD  Fe Cbn-Fe Gluc-FA-B12-C-DSS [facility] 90) 90-1 MG TABS  08/07/15   [provider]  hydrochlorothiazide (HYDRODIURIL) 12.5 MG tablet Take 1 tablet (  12.5 mg total) by mouth daily. 08/26/17   Carlean Jews, NP  hydrOXYzine (VISTARIL) 25 MG capsule Take 1 capsule (25 mg total) by mouth every 6 (six) hours as needed for anxiety (severe anxiety, insomnia). 03/08/17   Jomarie Longs, MD  ibuprofen (ADVIL,MOTRIN) 800 MG tablet Take 1 tablet (800 mg total) by mouth every 8 (eight) hours as needed for moderate pain. 09/09/17   Irean Hong, MD  methylPREDNISolone (MEDROL DOSEPAK) 4 MG TBPK tablet Take as directed 09/09/17   Irean Hong, MD  mupirocin ointment Idelle Jo) 2 %   02/02/16   [provider]  PARAGARD INTRAUTERINE COPPER IUD IUD 1 each by Intrauterine route once.    [provider]  sertraline (ZOLOFT) 50 MG tablet Take 1 tablet (50 mg total) by mouth daily. 08/22/17   Jomarie Longs, MD  vitamin B-12 (CYANOCOBALAMIN) 1000 MCG tablet Take 1,000 mcg by mouth daily as needed (energy). Reported on 05/08/2015    [provider]  zolpidem (AMBIEN) 10 MG tablet Take 0.5-1 tablets (5-10 mg total) by mouth at bedtime as needed for sleep. 08/22/17 09/21/17  Jomarie Longs, MD    Allergies Lithium; Penicillins; Shellfish allergy; and Latex  Family History  Problem Relation Age of Onset  . Hypertension Mother   . Seizures Brother   . Pancreatic cancer Unknown     Social History Social History   Tobacco Use  . Smoking status: Former Smoker    Years: 4.00  . Smokeless tobacco: Never Used  Substance Use Topics  . Alcohol use: Yes    Alcohol/week: 4.0 standard drinks    Types: 4 Glasses of wine per week    Comment: "socially"  . Drug use: No    Review of Systems  Constitutional: No fever/chills Eyes: No visual changes. ENT: No sore throat. Cardiovascular: Denies chest pain. Respiratory: Positive for shortness of breath. Gastrointestinal: No abdominal pain.  No nausea, no vomiting.  No diarrhea.  No constipation. Genitourinary: Negative for dysuria. Musculoskeletal: Positive for left hip pain.  Negative for back pain. Skin: Negative for rash. Neurological: Positive for headache and left leg numbness.  Negative for focal weakness.   ____________________________________________   PHYSICAL EXAM:  VITAL SIGNS: ED Triage Vitals  Enc Vitals Group     BP 09/08/17 2151 123/67     Pulse Rate 09/08/17 2151 79     Resp 09/08/17 2151 18     Temp 09/08/17 2151 98.9 F (37.2 C)     Temp Source 09/08/17 2151 Oral     SpO2 09/08/17 2151 97 %     Weight 09/08/17 2151 204 lb (92.5 kg)     Height 09/08/17 2151 5\' 7"  (1.702 m)      Head Circumference --      Peak Flow --      Pain Score 09/08/17 2150 6     Pain Loc --      Pain Edu? --      Excl. in GC? --     Constitutional: Alert and oriented. Well appearing and in no acute distress. Eyes: Conjunctivae are normal. PERRL. EOMI. Head: Atraumatic. Nose: No congestion/rhinnorhea. Mouth/Throat: Mucous membranes are moist.  Oropharynx non-erythematous. Neck: No stridor.  No cervical spine tenderness to palpation.  No carotid bruits. Cardiovascular: Normal rate, regular rhythm. Grossly normal heart sounds.  Good peripheral circulation. Respiratory: Normal respiratory effort.  No retractions. Lungs CTAB. Gastrointestinal: Soft and nontender to light or deep palpation. No distention. No abdominal bruits. No CVA tenderness.  Musculoskeletal: Lumbar spine and left buttock tender to palpation.  Hip is tender on abduction.  2+ femoral and distal pulses.  Calf is supple.  No lower extremity edema.  No joint effusions. Neurologic: Alert and oriented x3.  CN II-XII grossly intact.  Normal speech and language. No gross focal neurologic deficits are appreciated. MAEx4. Skin:  Skin is warm, dry and intact. No rash noted. Psychiatric: Mood and affect are normal. Speech and behavior are normal.  ____________________________________________   LABS (all labs ordered are listed, but only abnormal results are displayed)  Labs Reviewed  CBC - Abnormal; Notable for the following components:      Result Value   Hemoglobin 11.5 (*)    HCT 34.7 (*)    All other components within normal limits  BASIC METABOLIC PANEL  TROPONIN I  TROPONIN I  POC URINE PREG, ED  POCT PREGNANCY, URINE   ____________________________________________  EKG  ED ECG REPORT I, Cordarryl Monrreal J, the attending physician, personally viewed and interpreted this ECG.   Date: 09/09/2017  EKG Time: 2155  Rate: 79  Rhythm: normal EKG, normal sinus rhythm  Axis: Normal  Intervals:none  ST&T Change:  Nonspecific  ____________________________________________  RADIOLOGY  ED MD interpretation: No acute cardiopulmonary process  Official radiology report(s): Dg Chest 2 View  Result Date: 09/08/2017 CLINICAL DATA:  RIGHT body numbness, headache, LEFT hip pain, dizziness, shortness of breath. EXAM: CHEST - 2 VIEW COMPARISON:  Chest radiograph May 27, 2014 FINDINGS: Cardiomediastinal silhouette is normal. No pleural effusions or focal consolidations. Trachea projects midline and there is no pneumothorax. Soft tissue planes and included osseous structures are non-suspicious. IMPRESSION: Negative. Electronically Signed   By: Awilda Metro M.D.   On: 09/08/2017 22:17   Dg Lumbar Spine Complete  Result Date: 09/09/2017 CLINICAL DATA:  Pain numbness left side of body EXAM: LUMBAR SPINE - COMPLETE 4+ VIEW COMPARISON:  None. FINDINGS: There is no evidence of lumbar spine fracture. Alignment is normal. Intervertebral disc spaces are maintained. IUD in the pelvis IMPRESSION: Negative. Electronically Signed   By: Jasmine Pang M.D.   On: 09/09/2017 02:05   Ct Head Wo Contrast  Result Date: 09/09/2017 CLINICAL DATA:  Headache.  Patient reports left-sided numbness. EXAM: CT HEAD WITHOUT CONTRAST TECHNIQUE: Contiguous axial images were obtained from the base of the skull through the vertex without intravenous contrast. COMPARISON:  Brain MRI 05/08/2015 FINDINGS: Brain: No intracranial hemorrhage, mass effect, or midline shift. No hydrocephalus. The basilar cisterns are patent. No evidence of territorial infarct or acute ischemia. No extra-axial or intracranial fluid collection. Vascular: No hyperdense vessel. Skull: Normal. Sinuses/Orbits: Paranasal sinuses and mastoid air cells are clear. The visualized orbits are unremarkable. Other: None. IMPRESSION: Negative head CT. Electronically Signed   By: Rubye Oaks M.D.   On: 09/09/2017 01:44   Dg Hip Unilat With Pelvis 2-3 Views Left  Result Date:  09/09/2017 CLINICAL DATA:  Hip pain EXAM: DG HIP (WITH OR WITHOUT PELVIS) 2-3V LEFT COMPARISON:  None. FINDINGS: IUD in the pelvis. Calcified pelvic phleboliths. No fracture or malalignment. Joint space is maintained. IMPRESSION: Negative. Electronically Signed   By: Jasmine Pang M.D.   On: 09/09/2017 02:06    ____________________________________________   PROCEDURES  Procedure(s) performed: None  Procedures  Critical Care performed: No  ____________________________________________   INITIAL IMPRESSION / ASSESSMENT AND PLAN / ED COURSE  As part of my medical decision making, I reviewed the following data within the electronic MEDICAL RECORD NUMBER History obtained from family, Nursing notes  reviewed and incorporated, Labs reviewed, EKG interpreted, Old chart reviewed, Radiograph reviewed and Notes from prior ED visits   47 year old female with multiple medical complaints but primarily left hip pain which is most likely secondary to sciatica.  Her secondary complaints clearly started after severe hip pain.  Differential diagnosis includes but is not limited to sciatica, bursitis, septic joint, TIA, CVA, CAD, PE, dissection.  Initial laboratory results, EKG and chest x-ray unremarkable.  Patient herself no longer voices complaints other than pain in her left hip.  Hip is not warm or effusive and patient is freely able to move it with minimal pain; septic joint is not suspected.  Feel more likely element of sciatica.  Will administer 50 mg IV Toradol.  With 2 mg oral Valium for discomfort and muscle relaxation.  Given patient's history of TIA, will obtain CT head.  Will obtain plain film x-rays of lumbar spine and left hip to evaluate for orthopedic injuries.  Clinical Course as of Sep 09 517  Fri Sep 09, 2017  0245 Husband states patient feeling significantly better after Toradol and Valium.  She is currently asleep in no acute distress.  Will discharge home with prescriptions for NSAIDs,  Valium and Medrol Dosepak.  She is to follow-up closely with her PCP.  Strict return precautions given.  Spouse verbalizes understanding and agrees with plan of care.   [JS]    Clinical Course User Index [JS] Irean HongSung, Glorianna Gott J, MD     ____________________________________________   FINAL CLINICAL IMPRESSION(S) / ED DIAGNOSES  Final diagnoses:  Sciatica of left side     ED Discharge Orders         Ordered    ibuprofen (ADVIL,MOTRIN) 800 MG tablet  Every 8 hours PRN     09/09/17 0246    diazepam (VALIUM) 2 MG tablet  Every 8 hours PRN     09/09/17 0246    methylPREDNISolone (MEDROL DOSEPAK) 4 MG TBPK tablet     09/09/17 0246           Note:  This document was prepared using Dragon voice recognition software and may include unintentional dictation errors.    Irean HongSung, Xan Sparkman J, MD 09/09/17 [phone]

## 2017-09-13 LAB — UA/M W/RFLX CULTURE, ROUTINE
Bilirubin, UA: NEGATIVE
Glucose, UA: NEGATIVE
Leukocytes, UA: NEGATIVE
Nitrite, UA: NEGATIVE
RBC, UA: NEGATIVE
Specific Gravity, UA: >=1.03 — AB (ref 1.005–1.030)
Urobilinogen, Ur: 1 mg/dL (ref 0.2–1.0)
pH, UA: 6.5 (ref 5.0–7.5)

## 2017-09-13 LAB — MICROSCOPIC EXAMINATION
Casts: NONE SEEN /lpf
Epithelial Cells (non renal): >10 /hpf — AB (ref 0–10)

## 2017-09-13 LAB — URINE CULTURE, REFLEX

## 2017-09-14 ENCOUNTER — Ambulatory Visit: Payer: 59 | Admitting: Psychiatry

## 2017-09-14 ENCOUNTER — Telehealth: Payer: Self-pay

## 2017-09-14 MED ORDER — NITROFURANTOIN MONOHYD MACRO 100 MG PO CAPS: 100.0000 mg | ORAL_CAPSULE | Freq: Two times a day (BID) | ORAL | 0 refills | Status: DC

## 2017-09-14 NOTE — Telephone Encounter (Signed)
Pt advised urine did showed Uti we send macrobid to your pharmacy

## 2017-09-19 DIAGNOSIS — T50905A Adverse effect of unspecified drugs, medicaments and biological substances, initial encounter: Secondary | ICD-10-CM | POA: Insufficient documentation

## 2017-09-19 DIAGNOSIS — R635 Abnormal weight gain: Secondary | ICD-10-CM | POA: Insufficient documentation

## 2017-09-19 DIAGNOSIS — Z1239 Encounter for other screening for malignant neoplasm of breast: Secondary | ICD-10-CM | POA: Insufficient documentation

## 2017-09-19 DIAGNOSIS — R300 Dysuria: Secondary | ICD-10-CM | POA: Insufficient documentation

## 2017-09-26 ENCOUNTER — Other Ambulatory Visit: Payer: Self-pay

## 2017-09-26 ENCOUNTER — Ambulatory Visit (INDEPENDENT_AMBULATORY_CARE_PROVIDER_SITE_OTHER): Payer: 59 | Admitting: Licensed Clinical Social Worker

## 2017-09-26 ENCOUNTER — Encounter: Payer: Self-pay | Admitting: Psychiatry

## 2017-09-26 ENCOUNTER — Ambulatory Visit: Payer: 59 | Admitting: Psychiatry

## 2017-09-26 VITALS — BP 113/71 | HR 99 | Temp 98.7°F | Wt 206.6 lb

## 2017-09-26 DIAGNOSIS — F3181 Bipolar II disorder: Secondary | ICD-10-CM

## 2017-09-26 DIAGNOSIS — F410 Panic disorder [episodic paroxysmal anxiety] without agoraphobia: Secondary | ICD-10-CM | POA: Diagnosis not present

## 2017-09-26 MED ORDER — ZOLPIDEM TARTRATE 10 MG PO TABS: 5.0000 mg | ORAL_TABLET | Freq: Every evening | ORAL | 1 refills | Status: DC | PRN

## 2017-09-26 NOTE — Progress Notes (Signed)
[facility] MD OP Progress Note  09/26/2017 5:49 PM NIELLE DUFORD  MRN:  [id number]  Chief Complaint: ' I am here for follow up." Chief Complaint    Follow-up; Medication Refill     WUJ:WJXBJYN is a 47 year old African-American female, married, unemployed, lives in [place], has a history of mood lability, anxiety, history of TIA, presented to the clinic today for a follow-up visit.  Patient today reports she has been taking the Wellbutrin as prescribed.  She reports the medication has improved her mood symptoms and has given her more motivation.  She reports she continues to feel hopeless at times however her suicidality has improved a lot.  She reports she is able to talk about how she feels to her family and asked for support more than what she was able to do in the past.  She reports her husband has been helping her out more.  She also been seeing [name] for psychotherapy sessions.  Patient reports she continues to take Ambien as needed for sleep which helps.  Patient denies any perceptual disturbances.  Patient today reports she may be coming down with some flulike symptoms and she plans to go to her primary medical doctor if it worsens.  She reports some headaches and nausea.     Visit Diagnosis:    ICD-10-CM   1. Bipolar 2 disorder, major depressive episode (HCC) F31.81 zolpidem (AMBIEN) 10 MG tablet  2. Panic disorder F41.0     Past Psychiatric History: Have reviewed past psychiatric history from my progress note on 04/18/2017  Past Medical History:  Past Medical History:  Diagnosis Date  . Anemia   . Anxiety    Panic attacks  . Complication of anesthesia    pt reports "local" wears off quickly  . Depression   . Environmental allergies   . Hemorrhoids   . Leaky heart valve   . Sciatica 09/08/2017  . Wears contact lenses     Past Surgical History:  Procedure Laterality Date  . Caesaran section  1989  . COLONOSCOPY WITH PROPOFOL N/A 10/07/2014   Procedure: COLONOSCOPY  WITH PROPOFOL;  Surgeon: [name], MD;  Location: [facility];  Service: Endoscopy;  Laterality: N/A;  Latex  . HERNIA REPAIR  1978    Family Psychiatric History: Reviewed family psychiatric history from my progress note on 04/18/2017  Family History:  Family History  Problem Relation Age of Onset  . Hypertension Mother   . Seizures Brother   . Pancreatic cancer Unknown     Social History: Reviewed social history from my progress note on 04/18/2017 Social History   Socioeconomic History  . Marital status: Married    Spouse name: Not on file  . Number of children: 5  . Years of education: 31  . Highest education level: Not on file  Occupational History  . Occupation: [employer]  Social Needs  . Financial resource strain: Not on file  . Food insecurity:    Worry: Not on file    Inability: Not on file  . Transportation needs:    Medical: Not on file    Non-medical: Not on file  Tobacco Use  . Smoking status: Former Smoker    Years: 4.00  . Smokeless tobacco: Never Used  Substance and Sexual Activity  . Alcohol use: Yes    Alcohol/week: 4.0 standard drinks    Types: 4 Glasses of wine per week    Comment: "socially"  . Drug use: No  . Sexual activity: Yes  Birth control/protection: IUD  Lifestyle  . Physical activity:    Days per week: Not on file    Minutes per session: Not on file  . Stress: Not on file  Relationships  . Social connections:    Talks on phone: Not on file    Gets together: Not on file    Attends religious service: Not on file    Active member of club or organization: Not on file    Attends meetings of clubs or organizations: Not on file    Relationship status: Not on file  Other Topics Concern  . Not on file  Social History Narrative   Lives at home w/ her husband and children   Left-handed   Drinks 1 cup of coffee per day    Allergies:  Allergies  Allergen Reactions  . Lithium     Face swelling   . Penicillins Other (See  Comments)    Unknown reaction Has patient had a PCN reaction causing immediate rash, facial/tongue/throat swelling, SOB or lightheadedness with hypotension: YES Has patient had a PCN reaction causing severe rash involving mucus membranes or skin necrosis: NO Has patient had a PCN reaction that required hospitalizationNO Has patient had a PCN reaction occurring within the last 10 years: NO If all of the above answers are "NO", then may proceed with Cephalosporin use.  . Shellfish Allergy Swelling    lips  . Latex Rash    Metabolic Disorder Labs: Lab Results  Component Value Date   HGBA1C 5.4 05/09/2015   MPG 108 05/09/2015   No results found for: PROLACTIN Lab Results  Component Value Date   CHOL 128 05/09/2015   TRIG 79 05/09/2015   HDL 41 05/09/2015   CHOLHDL 3.1 05/09/2015   VLDL 16 05/09/2015   LDLCALC 71 05/09/2015   Lab Results  Component Value Date   TSH 2.093 08/26/2017    Therapeutic Level Labs: No results found for: LITHIUM No results found for: VALPROATE No components found for:  CBMZ  Current Medications: Current Outpatient Medications  Medication Sig Dispense Refill  . ARIPiprazole (ABILIFY) 10 MG tablet Take 0.5 tablets (5 mg total) by mouth daily. 90 tablet 0  . aspirin 81 MG tablet Take 162 mg by mouth daily.    Marland Kitchen buPROPion (WELLBUTRIN) 75 MG tablet Take 1 tablet (75 mg total) by mouth every morning. 90 tablet 0  . cetirizine-pseudoephedrine (ZYRTEC-D) 5-120 MG per tablet Take 1 tablet by mouth 2 (two) times daily as needed for allergies.    . Cholecalciferol (VITAMIN D3) 5000 units CAPS Take 1 capsule by mouth daily.    . diazepam (VALIUM) 2 MG tablet Take 1 tablet (2 mg total) by mouth every 8 (eight) hours as needed for muscle spasms. 15 tablet 0  . Fe Cbn-Fe Gluc-FA-B12-C-DSS (FERRALET 90) 90-1 MG TABS     . hydrochlorothiazide (HYDRODIURIL) 12.5 MG tablet Take 1 tablet (12.5 mg total) by mouth daily. 30 tablet 3  . hydrOXYzine (VISTARIL) 25 MG  capsule Take 1 capsule (25 mg total) by mouth every 6 (six) hours as needed for anxiety (severe anxiety, insomnia). 120 capsule 1  . ibuprofen (ADVIL,MOTRIN) 800 MG tablet Take 1 tablet (800 mg total) by mouth every 8 (eight) hours as needed for moderate pain. 15 tablet 0  . methylPREDNISolone (MEDROL DOSEPAK) 4 MG TBPK tablet Take as directed 21 tablet 0  . mupirocin ointment (BACTROBAN) 2 %     . nitrofurantoin, macrocrystal-monohydrate, (MACROBID) 100 MG capsule Take 1 capsule (  100 mg total) by mouth 2 (two) times daily. 20 capsule 0  . PARAGARD INTRAUTERINE COPPER IUD IUD 1 each by Intrauterine route once.    . phentermine (ADIPEX-P) 37.5 MG tablet Take 1 tablet (37.5 mg total) by mouth daily before breakfast. 30 tablet 1  . sertraline (ZOLOFT) 50 MG tablet Take 1 tablet (50 mg total) by mouth daily. 90 tablet 0  . vitamin B-12 (CYANOCOBALAMIN) 1000 MCG tablet Take 1,000 mcg by mouth daily as needed (energy). Reported on 05/08/2015    . zolpidem (AMBIEN) 10 MG tablet Take 0.5-1 tablets (5-10 mg total) by mouth at bedtime as needed for sleep. 15 tablet 1   No current facility-administered medications for this visit.      Musculoskeletal: Strength & Muscle Tone: within normal limits Gait & Station: normal Patient leans: N/A  Psychiatric Specialty Exam: Review of Systems  Psychiatric/Behavioral: Positive for depression.  All other systems reviewed and are negative.   Blood pressure 113/71, pulse 99, temperature 98.7 F (37.1 C), temperature source Oral, weight 206 lb 9.6 oz (93.7 kg).Body mass index is 32.36 kg/m.  General Appearance: Casual  Eye Contact:  Fair  Speech:  Normal Rate  Volume:  Normal  Mood:  Dysphoric  Affect:  Congruent  Thought Process:  Goal Directed and Descriptions of Associations: Intact  Orientation:  Full (Time, Place, and Person)  Thought Content: Logical   Suicidal Thoughts:  No  Homicidal Thoughts:  No  Memory:  Immediate;   Fair Recent;    Fair Remote;   Fair  Judgement:  Fair  Insight:  Fair  Psychomotor Activity:  Normal  Concentration:  Concentration: Fair and Attention Span: Fair  Recall:  [employer] of Knowledge: Fair  Language: Fair  Akathisia:  No  Handed:  Right  AIMS (if indicated): 0  Assets:  Communication Skills Desire for Improvement Social Support  ADL's:  Intact  Cognition: WNL  Sleep:  Fair   Screenings: PHQ2-9     Office Visit from 09/09/2017 in [facility], [facility] Office Visit from 08/26/2017 in [facility], [facility]  PHQ-2 Total Score  4  6  PHQ-9 Total Score  15  21       Assessment and Plan: Zamiya is a 47 year old African-American female who has a history of mood disorder, panic attacks, history of TIA, history of trauma, presented to the clinic today for a follow-up visit.  Patient today reports some improvement with her mood symptoms now that she is on Wellbutrin.  She denies any suicidality or perceptual disturbances.  Patient will benefit from continued psychotherapy sessions as well as continued medications as noted below.  Plan Bipolar disorder Continue reduced dose of Zoloft at 50 mg p.o. daily. Continue Wellbutrin 75 mg p.o. daily in the morning Continue Abilify 5 mg p.o. daily, even though she was prescribed 15 mg she was compliant with just 5 mg.  We will continue the same dosage.  For panic disorder Continue Zoloft at reduced dose of 50 mg p.o. daily Continue hydroxyzine 25 mg p.o. 3 times daily as needed Continue CBT with [name].  For insomnia Hydroxyzine 50 mg p.o. nightly as needed Ambien 5-10 mg p.o. nightly as needed  Discussed with patient about more frequent psychotherapy visits.  Patient reports she is trying to get patient assistance program approved.  She does have financial issues which is a major barrier at this time.  More than 50 % of the time was spent for psychoeducation and supportive psychotherapy  and care coordination.  This note  was generated in part or whole with voice recognition software. Voice recognition is usually quite accurate but there are transcription errors that can and very often do occur. I apologize for any typographical errors that were not detected and corrected.      Jomarie Longs, MD 09/26/2017, 5:49 PM

## 2017-09-30 NOTE — Progress Notes (Signed)
   THERAPIST PROGRESS NOTE  Session Time: 24mn  Participation Level: Active  Behavioral Response: CasualAlertDepressed  Type of Therapy: Individual Therapy  Treatment Goals addressed: Coping  Interventions: CBT and Motivational Interviewing  Summary: Brittney BISIGis a 47y.o. female who presents with continued symptoms of her diagnosis.   Therapist met with Patient in an outpatient setting to assess current mood and assist with making progress towards goals through the use of therapeutic intervention. Therapist did a brief mood check, assessing anger, fear, disgust, excitement, happiness, and sadness.  Patient reports that she has been "sad" lately.  Encouraged patient to inform her husband of her triggers and symptoms and allow him to assist with coping and recognizing when her mood changes.  Discussed her daily schedule and encouraged her to do something different daily and journal how she felt afterwards.    Suicidal/Homicidal: No  Plan: Return again in 2 weeks.  Diagnosis: Axis I: Bipolar, Depressed    Axis II: No diagnosis    NLubertha South LCSW 09/26/2017

## 2017-10-11 ENCOUNTER — Ambulatory Visit: Payer: 59 | Admitting: Licensed Clinical Social Worker

## 2017-10-11 DIAGNOSIS — F3181 Bipolar II disorder: Secondary | ICD-10-CM

## 2017-10-17 ENCOUNTER — Other Ambulatory Visit: Payer: Self-pay | Admitting: Psychiatry

## 2017-10-17 DIAGNOSIS — F3181 Bipolar II disorder: Secondary | ICD-10-CM

## 2017-10-19 ENCOUNTER — Ambulatory Visit: Payer: Self-pay | Admitting: Nurse Practitioner

## 2017-10-24 ENCOUNTER — Ambulatory Visit (INDEPENDENT_AMBULATORY_CARE_PROVIDER_SITE_OTHER): Payer: 59 | Admitting: Licensed Clinical Social Worker

## 2017-10-24 ENCOUNTER — Other Ambulatory Visit: Payer: Self-pay

## 2017-10-24 ENCOUNTER — Ambulatory Visit (INDEPENDENT_AMBULATORY_CARE_PROVIDER_SITE_OTHER): Payer: 59 | Admitting: Psychiatry

## 2017-10-24 ENCOUNTER — Encounter: Payer: Self-pay | Admitting: Psychiatry

## 2017-10-24 ENCOUNTER — Telehealth (HOSPITAL_COMMUNITY): Payer: Self-pay | Admitting: Psychiatry

## 2017-10-24 VITALS — BP 125/80 | HR 76 | Temp 98.2°F | Wt 206.4 lb

## 2017-10-24 DIAGNOSIS — F410 Panic disorder [episodic paroxysmal anxiety] without agoraphobia: Secondary | ICD-10-CM | POA: Diagnosis not present

## 2017-10-24 DIAGNOSIS — F3181 Bipolar II disorder: Secondary | ICD-10-CM | POA: Diagnosis not present

## 2017-10-24 MED ORDER — BUPROPION HCL 100 MG PO TABS: 100.0000 mg | ORAL_TABLET | Freq: Every day | ORAL | 0 refills | Status: DC

## 2017-10-24 NOTE — Telephone Encounter (Signed)
D:  Nolon Rod, LCSW referred pt to [facility] again.  A:  Placed call to patient again and left vm for requesting that she call writer back.  Informed Nolon Rod, LCSW.

## 2017-10-24 NOTE — Progress Notes (Signed)
BH MD OP Progress Note  10/24/2017 1:44 PM Brittney Duran  MRN:  [id number]  Chief Complaint: ' I am here for follow up." Chief Complaint    Follow-up; Medication Refill     HPI: Brittney Duran is a 47 year old African-American female, married, unemployed, lives in [place], has a history of mood lability, anxiety, history of TIA, presented to the clinic today for a follow-up visit.  Patient today reports she went through an episode of her depressive symptoms few days ago.  She reports she had increased crying spells, low mood, being withdrawn and so on during that time.  She also had some passive suicidal ideation however reports she did not have a plan.  She reports her husband took some time off from work to help and support her which helped her over the weekend.  She also reports that a friend visited when she was going through the depressive spell.  She reports this friend whom she has not seen in a long time prayed over her.  She reports after that she noticed a big shift in her.  She reports she feels much better now and does not have any significant depressive symptoms or suicidal ideation at this time.  She reports she has been taking her medication as prescribed.  She is currently on a high dose of Abilify.  She has a therapy appointment with our therapist here in clinic today.  With patient's permission writer contacted her husband.  Discussed patient care as well as her recent suicidality with her husband.  Her husband reports he will have a discussion with her about pursuing intensive outpatient/PHP program.  Also discussed to make sure that patient follow up for more frequent psychotherapy sessions as well as follow-up for medication management in clinic.   Visit Diagnosis:    ICD-10-CM   1. Bipolar 2 disorder, major depressive episode (HCC) F31.81   2. Panic disorder F41.0     Past Psychiatric History: Reviewed past psychiatric history from my progress note on 04/18/2017.  Past  Medical History:  Past Medical History:  Diagnosis Date  . Anemia   . Anxiety    Panic attacks  . Complication of anesthesia    pt reports "local" wears off quickly  . Depression   . Environmental allergies   . Hemorrhoids   . Leaky heart valve   . Sciatica 09/08/2017  . Wears contact lenses     Past Surgical History:  Procedure Laterality Date  . Caesaran section  1989  . COLONOSCOPY WITH PROPOFOL N/A 10/07/2014   Procedure: COLONOSCOPY WITH PROPOFOL;  Surgeon: [name], MD;  Location: [facility];  Service: Endoscopy;  Laterality: N/A;  Latex  . HERNIA REPAIR  1978    Family Psychiatric History: Reviewed family psychiatric history from my progress note on 04/18/2017  Family History:  Family History  Problem Relation Age of Onset  . Hypertension Mother   . Seizures Brother   . Pancreatic cancer Unknown     Social History: Reviewed social history from my progress note on 04/18/2017 Social History   Socioeconomic History  . Marital status: Married    Spouse name: Not on file  . Number of children: 5  . Years of education: 48  . Highest education level: Not on file  Occupational History  . Occupation: [employer]  Social Needs  . Financial resource strain: Not on file  . Food insecurity:    Worry: Not on file    Inability: Not on file  .  Transportation needs:    Medical: Not on file    Non-medical: Not on file  Tobacco Use  . Smoking status: Former Smoker    Years: 4.00  . Smokeless tobacco: Never Used  Substance and Sexual Activity  . Alcohol use: Yes    Alcohol/week: 4.0 standard drinks    Types: 4 Glasses of wine per week    Comment: "socially"  . Drug use: No  . Sexual activity: Yes    Birth control/protection: IUD  Lifestyle  . Physical activity:    Days per week: Not on file    Minutes per session: Not on file  . Stress: Not on file  Relationships  . Social connections:    Talks on phone: Not on file    Gets together: Not on file     Attends religious service: Not on file    Active member of club or organization: Not on file    Attends meetings of clubs or organizations: Not on file    Relationship status: Not on file  Other Topics Concern  . Not on file  Social History Narrative   Lives at home w/ her husband and children   Left-handed   Drinks 1 cup of coffee per day    Allergies:  Allergies  Allergen Reactions  . Lithium     Face swelling   . Penicillins Other (See Comments)    Unknown reaction Has patient had a PCN reaction causing immediate rash, facial/tongue/throat swelling, SOB or lightheadedness with hypotension: YES Has patient had a PCN reaction causing severe rash involving mucus membranes or skin necrosis: NO Has patient had a PCN reaction that required hospitalizationNO Has patient had a PCN reaction occurring within the last 10 years: NO If all of the above answers are "NO", then may proceed with Cephalosporin use.  . Shellfish Allergy Swelling    lips  . Latex Rash    Metabolic Disorder Labs: Lab Results  Component Value Date   HGBA1C 5.4 05/09/2015   MPG 108 05/09/2015   No results found for: PROLACTIN Lab Results  Component Value Date   CHOL 128 05/09/2015   TRIG 79 05/09/2015   HDL 41 05/09/2015   CHOLHDL 3.1 05/09/2015   VLDL 16 05/09/2015   LDLCALC 71 05/09/2015   Lab Results  Component Value Date   TSH 2.093 08/26/2017    Therapeutic Level Labs: No results found for: LITHIUM No results found for: VALPROATE No components found for:  CBMZ  Current Medications: Current Outpatient Medications  Medication Sig Dispense Refill  . ARIPiprazole (ABILIFY) 10 MG tablet TAKE 1.5 TABLETS BY MOUTH DAILY 135 tablet 0  . aspirin 81 MG tablet Take 162 mg by mouth daily.    Marland Kitchen buPROPion (WELLBUTRIN) 100 MG tablet Take 1 tablet (100 mg total) by mouth daily. 90 tablet 0  . cetirizine-pseudoephedrine (ZYRTEC-D) 5-120 MG per tablet Take 1 tablet by mouth 2 (two) times daily as needed  for allergies.    . Cholecalciferol (VITAMIN D3) 5000 units CAPS Take 1 capsule by mouth daily.    . diazepam (VALIUM) 2 MG tablet Take 1 tablet (2 mg total) by mouth every 8 (eight) hours as needed for muscle spasms. 15 tablet 0  . Fe Cbn-Fe Gluc-FA-B12-C-DSS (FERRALET 90) 90-1 MG TABS     . hydrochlorothiazide (HYDRODIURIL) 12.5 MG tablet Take 1 tablet (12.5 mg total) by mouth daily. 30 tablet 3  . hydrOXYzine (VISTARIL) 25 MG capsule Take 1 capsule (25 mg total) by mouth  every 6 (six) hours as needed for anxiety (severe anxiety, insomnia). 120 capsule 1  . ibuprofen (ADVIL,MOTRIN) 800 MG tablet Take 1 tablet (800 mg total) by mouth every 8 (eight) hours as needed for moderate pain. 15 tablet 0  . methylPREDNISolone (MEDROL DOSEPAK) 4 MG TBPK tablet Take as directed 21 tablet 0  . mupirocin ointment (BACTROBAN) 2 %     . nitrofurantoin, macrocrystal-monohydrate, (MACROBID) 100 MG capsule Take 1 capsule (100 mg total) by mouth 2 (two) times daily. 20 capsule 0  . PARAGARD INTRAUTERINE COPPER IUD IUD 1 each by Intrauterine route once.    . phentermine (ADIPEX-P) 37.5 MG tablet Take 1 tablet (37.5 mg total) by mouth daily before breakfast. 30 tablet 1  . sertraline (ZOLOFT) 50 MG tablet Take 1 tablet (50 mg total) by mouth daily. 90 tablet 0  . vitamin B-12 (CYANOCOBALAMIN) 1000 MCG tablet Take 1,000 mcg by mouth daily as needed (energy). Reported on 05/08/2015    . zolpidem (AMBIEN) 10 MG tablet Take 0.5-1 tablets (5-10 mg total) by mouth at bedtime as needed for sleep. 15 tablet 1   No current facility-administered medications for this visit.      Musculoskeletal: Strength & Muscle Tone: within normal limits Gait & Station: normal Patient leans: N/A  Psychiatric Specialty Exam: Review of Systems  Psychiatric/Behavioral: Positive for depression.  All other systems reviewed and are negative.   Blood pressure 125/80, pulse 76, temperature 98.2 F (36.8 C), temperature source Oral, weight  206 lb 6.4 oz (93.6 kg).Body mass index is 32.33 kg/m.  General Appearance: Casual  Eye Contact:  Fair  Speech:  Clear and Coherent  Volume:  Normal  Mood:  Dysphoric  Affect:  Congruent  Thought Process:  Goal Directed and Descriptions of Associations: Intact  Orientation:  Full (Time, Place, and Person)  Thought Content: Logical   Suicidal Thoughts:  No  Homicidal Thoughts:  No  Memory:  Immediate;   Fair Recent;   Fair Remote;   Fair  Judgement:  Fair  Insight:  Fair  Psychomotor Activity:  Normal  Concentration:  Concentration: Fair and Attention Span: Fair  Recall:  [employer] of Knowledge: Fair  Language: Fair  Akathisia:  No  Handed:  Right  AIMS (if indicated): denies tremors, rigidity, stiffness  Assets:  Communication Skills Desire for Improvement Social Support  ADL's:  Intact  Cognition: WNL  Sleep:  Fair   Screenings: PHQ2-9     Office Visit from 09/09/2017 in [facility], [facility] Office Visit from 08/26/2017 in [facility], [facility]  PHQ-2 Total Score  4  6  PHQ-9 Total Score  15  21       Assessment and Plan: Navreet is a 47 year old African-American female who has a history of mood disorder, panic attacks, history of TIA, history of trauma, presented to the clinic today for a follow-up visit.  Patient with continued episodes of depressive symptoms on and off.  Will continue to make medication changes as well as refer patient for [facility] program or more frequent individual psychotherapy sessions.  Plan Bipolar disorder Continue Zoloft 50 mg p.o. daily Increase Wellbutrin to 100 mg p.o. daily Continue Abilify 15 mg p.o. Daily  Panic disorder Continue Zoloft at 50 mg p.o. daily Wellbutrin 100 mg p.o. daily Continue hydroxyzine 25 mg p.o. 3 times daily as needed Continue CBT with [name]  For insomnia Hydroxyzine as prescribed Ambien 5-10 mg p.o. nightly as needed  I have contacted patient's husband for  collateral information as  well as discussing her plan of care-[place] at [phone].  Discussed with him about patient's recent suicidal thoughts and how having a friend come to visit helped her to get through that phase.  Also discussed more support as well as to monitor her closely to make sure she is not decompensating again.  Discussed PHP/IOP referral and also more frequent office visits for medication management.  Patient to follow-up in clinic in 1 week or sooner if needed.  More than 50 % of the time was spent for psychoeducation and supportive psychotherapy and care coordination.  This note was generated in part or whole with voice recognition software. Voice recognition is usually quite accurate but there are transcription errors that can and very often do occur. I apologize for any typographical errors that were not detected and corrected.         Jomarie Longs, MD 10/24/2017, 1:44 PM

## 2017-10-25 ENCOUNTER — Ambulatory Visit: Payer: Self-pay | Admitting: Adult Health

## 2017-10-26 NOTE — Progress Notes (Signed)
   THERAPIST PROGRESS NOTE  Session Time:  Participation Level: Active  Behavioral Response: CasualAlertEuthymic  Type of Therapy: Individual Therapy  Treatment Goals addressed: Coping  Interventions: CBT and Motivational Interviewing  Summary: Brittney Duran is a 46 y.o. female who presents with continued symptoms of diagnosis.  Patient reports an "unfavorable" mood due to illness (cold or allergies).  Patient reports an increase in good days.  Patient reports that she wants to do something nice for her husband to show appreciation for all he does and continues to do.  Patient listed things that she can do.  Explored with patient alternatives to making a purchase to show gratitude.  Discussed with patient her coping skills that have been useful.  Reviewed goals and discussed what she needs to assist with accomplishing them.   Suicidal/Homicidal: No  Plan: Return again in 2 weeks.  Diagnosis: Axis I: Bipolar, Depressed    Axis II: No diagnosis    Marinda Elk, LCSW 10/11/2017

## 2017-11-02 ENCOUNTER — Ambulatory Visit: Payer: Self-pay | Admitting: Adult Health

## 2017-11-03 ENCOUNTER — Ambulatory Visit (INDEPENDENT_AMBULATORY_CARE_PROVIDER_SITE_OTHER): Payer: 59 | Admitting: Psychiatry

## 2017-11-03 ENCOUNTER — Encounter: Payer: Self-pay | Admitting: Psychiatry

## 2017-11-03 VITALS — BP 136/84 | HR 76 | Ht 67.0 in | Wt 204.0 lb

## 2017-11-03 DIAGNOSIS — F410 Panic disorder [episodic paroxysmal anxiety] without agoraphobia: Secondary | ICD-10-CM

## 2017-11-03 DIAGNOSIS — F3181 Bipolar II disorder: Secondary | ICD-10-CM | POA: Diagnosis not present

## 2017-11-03 NOTE — Progress Notes (Signed)
BH MD OP Progress Note  11/03/2017 9:22 AM Brittney Duran  MRN:  [id number]  Chief Complaint: ' I am here for follow up.' Chief Complaint    Follow-up     HPI: Brittney Duran is a 47 year old African-American female, married, unemployed, lives in [place], has a history of mood lability, anxiety, history of TIA, presented to the clinic today for a follow-up visit.  Patient today reports that the current medications as effective.  She reports since her last visit here she has been doing better.  She does have periods when she has low mood however she has been able to cope with it and manage her symptoms better.  She reports she is compliant with her medications.  She denies any side effects to the medications.  She is tolerating the increased dosage of Wellbutrin well.  She reports she continues to struggle with sleep.  She reports she has been battling in her sleep and having some weird dreams.  She reports this has been going on since the past 2 days or so.  She has been taking Ambien 5 mg as well as hydroxyzine as needed for sleep.  Patient reports she wants to try a higher dose of Ambien first to see if that will help her with her sleep.  Also discussed dream planning the patient.  Discussed with patient to try down her dreams and bring it into her therapy sessions.  Patient reports her husband continues to be supportive.  She reports she has been trying to be more verbal about her mental health problems which has been helpful.  Patient denies any suicidality.  She denies any perceptual disturbances.  She reports she has upcoming therapy session on Monday .   Visit Diagnosis:    ICD-10-CM   1. Bipolar 2 disorder, major depressive episode (HCC) F31.81    improving  2. Panic disorder F41.0     Past Psychiatric History: Have reviewed past psychiatric history from my progress note on 04/18/2017  Past Medical History:  Past Medical History:  Diagnosis Date  . Anemia   . Anxiety    Panic  attacks  . Complication of anesthesia    pt reports "local" wears off quickly  . Depression   . Environmental allergies   . Hemorrhoids   . Leaky heart valve   . Sciatica 09/08/2017  . Wears contact lenses     Past Surgical History:  Procedure Laterality Date  . Caesaran section  1989  . COLONOSCOPY WITH PROPOFOL N/A 10/07/2014   Procedure: COLONOSCOPY WITH PROPOFOL;  Surgeon: [name], MD;  Location: [facility];  Service: Endoscopy;  Laterality: N/A;  Latex  . HERNIA REPAIR  1978    Family Psychiatric History: Reviewed family psychiatric history from my progress note on 04/18/2017  Family History:  Family History  Problem Relation Age of Onset  . Hypertension Mother   . Seizures Brother   . Pancreatic cancer Unknown     Social History: I have reviewed social history from my progress note on 04/18/2017 Social History   Socioeconomic History  . Marital status: Married    Spouse name: Not on file  . Number of children: 5  . Years of education: 25  . Highest education level: Not on file  Occupational History  . Occupation: [employer]  Social Needs  . Financial resource strain: Not on file  . Food insecurity:    Worry: Not on file    Inability: Not on file  . Transportation needs:  Medical: Not on file    Non-medical: Not on file  Tobacco Use  . Smoking status: Former Smoker    Years: 4.00  . Smokeless tobacco: Never Used  Substance and Sexual Activity  . Alcohol use: Yes    Alcohol/week: 4.0 standard drinks    Types: 4 Glasses of wine per week    Comment: "socially"  . Drug use: No  . Sexual activity: Yes    Birth control/protection: IUD  Lifestyle  . Physical activity:    Days per week: Not on file    Minutes per session: Not on file  . Stress: Not on file  Relationships  . Social connections:    Talks on phone: Not on file    Gets together: Not on file    Attends religious service: Not on file    Active member of club or organization: Not on  file    Attends meetings of clubs or organizations: Not on file    Relationship status: Not on file  Other Topics Concern  . Not on file  Social History Narrative   Lives at home w/ her husband and children   Left-handed   Drinks 1 cup of coffee per day    Allergies:  Allergies  Allergen Reactions  . Lithium     Face swelling   . Penicillins Other (See Comments)    Unknown reaction Has patient had a PCN reaction causing immediate rash, facial/tongue/throat swelling, SOB or lightheadedness with hypotension: YES Has patient had a PCN reaction causing severe rash involving mucus membranes or skin necrosis: NO Has patient had a PCN reaction that required hospitalizationNO Has patient had a PCN reaction occurring within the last 10 years: NO If all of the above answers are "NO", then may proceed with Cephalosporin use.  . Shellfish Allergy Swelling    lips  . Latex Rash    Metabolic Disorder Labs: Lab Results  Component Value Date   HGBA1C 5.4 05/09/2015   MPG 108 05/09/2015   No results found for: PROLACTIN Lab Results  Component Value Date   CHOL 128 05/09/2015   TRIG 79 05/09/2015   HDL 41 05/09/2015   CHOLHDL 3.1 05/09/2015   VLDL 16 05/09/2015   LDLCALC 71 05/09/2015   Lab Results  Component Value Date   TSH 2.093 08/26/2017    Therapeutic Level Labs: No results found for: LITHIUM No results found for: VALPROATE No components found for:  CBMZ  Current Medications: Current Outpatient Medications  Medication Sig Dispense Refill  . ARIPiprazole (ABILIFY) 10 MG tablet TAKE 1.5 TABLETS BY MOUTH DAILY 135 tablet 0  . aspirin 81 MG tablet Take 162 mg by mouth daily.    Marland Kitchen buPROPion (WELLBUTRIN) 100 MG tablet Take 1 tablet (100 mg total) by mouth daily. 90 tablet 0  . cetirizine-pseudoephedrine (ZYRTEC-D) 5-120 MG per tablet Take 1 tablet by mouth 2 (two) times daily as needed for allergies.    . Cholecalciferol (VITAMIN D3) 5000 units CAPS Take 1 capsule by mouth  daily.    . diazepam (VALIUM) 2 MG tablet Take 1 tablet (2 mg total) by mouth every 8 (eight) hours as needed for muscle spasms. 15 tablet 0  . Fe Cbn-Fe Gluc-FA-B12-C-DSS (FERRALET 90) 90-1 MG TABS     . hydrochlorothiazide (HYDRODIURIL) 12.5 MG tablet Take 1 tablet (12.5 mg total) by mouth daily. 30 tablet 3  . hydrOXYzine (VISTARIL) 25 MG capsule Take 1 capsule (25 mg total) by mouth every 6 (six) hours as  needed for anxiety (severe anxiety, insomnia). 120 capsule 1  . ibuprofen (ADVIL,MOTRIN) 800 MG tablet Take 1 tablet (800 mg total) by mouth every 8 (eight) hours as needed for moderate pain. 15 tablet 0  . methylPREDNISolone (MEDROL DOSEPAK) 4 MG TBPK tablet Take as directed 21 tablet 0  . mupirocin ointment (BACTROBAN) 2 %     . nitrofurantoin, macrocrystal-monohydrate, (MACROBID) 100 MG capsule Take 1 capsule (100 mg total) by mouth 2 (two) times daily. 20 capsule 0  . PARAGARD INTRAUTERINE COPPER IUD IUD 1 each by Intrauterine route once.    . phentermine (ADIPEX-P) 37.5 MG tablet Take 1 tablet (37.5 mg total) by mouth daily before breakfast. 30 tablet 1  . sertraline (ZOLOFT) 50 MG tablet Take 1 tablet (50 mg total) by mouth daily. 90 tablet 0  . vitamin B-12 (CYANOCOBALAMIN) 1000 MCG tablet Take 1,000 mcg by mouth daily as needed (energy). Reported on 05/08/2015    . zolpidem (AMBIEN) 10 MG tablet Take 0.5-1 tablets (5-10 mg total) by mouth at bedtime as needed for sleep. 15 tablet 1   No current facility-administered medications for this visit.      Musculoskeletal: Strength & Muscle Tone: within normal limits Gait & Station: normal Patient leans: N/A  Psychiatric Specialty Exam: Review of Systems  Psychiatric/Behavioral: Positive for depression (improving). The patient is nervous/anxious (improving) and has insomnia.   All other systems reviewed and are negative.   Blood pressure 136/84, pulse 76, height 5\' 7"  (1.702 m), weight 204 lb (92.5 kg), SpO2 96 %.Body mass index is  31.95 kg/m.  General Appearance: Casual  Eye Contact:  Fair  Speech:  Normal Rate  Volume:  Normal  Mood:  Dysphoric  Affect:  Appropriate  Thought Process:  Goal Directed and Descriptions of Associations: Intact  Orientation:  Full (Time, Place, and Person)  Thought Content: Logical   Suicidal Thoughts:  No  Homicidal Thoughts:  No  Memory:  Immediate;   Fair Recent;   Fair Remote;   Fair  Judgement:  Fair  Insight:  Good  Psychomotor Activity:  Normal  Concentration:  Concentration: Fair and Attention Span: Fair  Recall:  [employer] of Knowledge: Fair  Language: Fair  Akathisia:  No  Handed:  Right  AIMS (if indicated): denies tremors, rigidity,stiffness  Assets:  Communication Skills Desire for Improvement Social Support  ADL's:  Intact  Cognition: WNL  Sleep:  restless   Screenings: PHQ2-9     Office Visit from 09/09/2017 in [facility], [facility] Office Visit from 08/26/2017 in [facility], [facility]  PHQ-2 Total Score  4  6  PHQ-9 Total Score  15  21       Assessment and Plan: Brittney Duran is a 47 year old African-American female who has a history of mood disorder, panic attacks, history of TIA, history of trauma, presented to the clinic today for a follow-up visit.  Patient currently reports some improvement in her depressive symptoms.  She continues to have restless sleep.  She denies any suicidality and reports good support system.  She will continue psychotherapy sessions.  Plan as noted below.  Plan Bipolar disorder Continue Zoloft 50 mg p.o. daily Wellbutrin 100 mg p.o. Daily Abilify 15 mg p.o. daily  Panic disorder Zoloft 50 mg p.o. daily Wellbutrin 100 mg p.o. daily Hydroxyzine 25 mg p.o. 3 times daily as needed Continue CBT with [name]  For insomnia Discussed with patient to take Ambien 10 mg p.o. nightly as needed Hydroxyzine as prescribed Discussed  dream planning.  Follow-up in clinic in 2 weeks or sooner if needed.  More than  50 % of the time was spent for psychoeducation and supportive psychotherapy and care coordination.  This note was generated in part or whole with voice recognition software. Voice recognition is usually quite accurate but there are transcription errors that can and very often do occur. I apologize for any typographical errors that were not detected and corrected.        Jomarie Longs, MD 11/03/2017, 9:22 AM

## 2017-11-07 ENCOUNTER — Ambulatory Visit (INDEPENDENT_AMBULATORY_CARE_PROVIDER_SITE_OTHER): Payer: 59 | Admitting: Licensed Clinical Social Worker

## 2017-11-07 DIAGNOSIS — F3181 Bipolar II disorder: Secondary | ICD-10-CM | POA: Diagnosis not present

## 2017-11-15 NOTE — Progress Notes (Signed)
   THERAPIST PROGRESS NOTE  Session Time: 17mn  Participation Level: Active  Behavioral Response: CasualAlertAnxious  Type of Therapy: Individual Therapy  Treatment Goals addressed: Anxiety  Interventions: CBT and Motivational Interviewing  Summary: Brittney Duran a 47y.o. female who presents with continued symptoms of diagnosis.  Therapist met with Patient in an outpatient setting to assess current mood and assist with making progress towards goals through the use of therapeutic intervention. Therapist did a brief mood check, assessing anger, fear, disgust, excitement, happiness, and sadness.  Patient reports crying spells and anger.  Patient discussed incident with her teenage daughter that led to her being placed out of the home with her maternal grandmother.  Explored with Patient her expectations of teenage and Patient's mental health status that may interfere with effective parenting.  Explored alternatives to current symptoms and encouraged Patient to create a daily routine    Suicidal/Homicidal: No  Plan: Return again in 2 weeks.  Diagnosis: Axis I: Bipolar 2    Axis II: No diagnosis    NLubertha South LCSW 11/08/2017

## 2017-11-17 ENCOUNTER — Ambulatory Visit: Payer: 59 | Admitting: Psychiatry

## 2017-11-17 ENCOUNTER — Encounter: Payer: Self-pay | Admitting: Psychiatry

## 2017-11-17 ENCOUNTER — Other Ambulatory Visit: Payer: Self-pay

## 2017-11-17 VITALS — BP 124/75 | HR 78 | Temp 98.0°F | Wt 209.2 lb

## 2017-11-17 DIAGNOSIS — F3181 Bipolar II disorder: Secondary | ICD-10-CM | POA: Diagnosis not present

## 2017-11-17 DIAGNOSIS — F410 Panic disorder [episodic paroxysmal anxiety] without agoraphobia: Secondary | ICD-10-CM

## 2017-11-17 MED ORDER — BUPROPION HCL ER (XL) 150 MG PO TB24: 150.0000 mg | ORAL_TABLET | Freq: Every day | ORAL | 0 refills | Status: DC

## 2017-11-17 NOTE — Progress Notes (Signed)
BH MD OP Progress Note  11/17/2017 1:13 PM Brittney Duran  MRN:  [id number]  Chief Complaint: ' I am here for follow up.' Chief Complaint    Follow-up; Medication Refill     HPI: Brittney Duran is a 47 yr old [place] American female, married, unemployed, lives in [place], has a history of mood lability, anxiety, history of TIA, presented to the clinic today for a follow-up visit.  Patient today reports she has noticed some lack of energy, fatigue, feeling drained and so on since the past few days.  She reports she believes it is due to her menstrual cycle.  She reports she usually feels this way around that time.  She reports sleep is improved.  She reports she has not had any hypomanic or manic episodes recently.  Patient also reports she is compliant on her medications.  Pt reports she has been taking hydroxyzine around the clock.  She reports she has developed a habit of taking it 3 times a day on a regular basis.  Discussed with patient to use hydroxyzine orally as needed and to limit use.  Discussed with patient taking hydroxyzine throughout the day could also be contributing to her fatigue.  Patient reports she will try to limit use.  She denies any suicidality at this time.  She reports her husband is more supportive.  Patient continues to be in psychotherapy with Ms. Kerby Nora which is going well.  She denies any other concerns today. Visit Diagnosis:    ICD-10-CM   1. Bipolar 2 disorder, major depressive episode (HCC) F31.81 buPROPion (WELLBUTRIN XL) 150 MG 24 hr tablet  2. Panic disorder F41.0     Past Psychiatric History: Have reviewed past psychiatric history from my progress note on 04/18/2017  Past Medical History:  Past Medical History:  Diagnosis Date  . Anemia   . Anxiety    Panic attacks  . Complication of anesthesia    pt reports "local" wears off quickly  . Depression   . Environmental allergies   . Hemorrhoids   . Leaky heart valve   . Sciatica 09/08/2017  .  Wears contact lenses     Past Surgical History:  Procedure Laterality Date  . Caesaran section  1989  . COLONOSCOPY WITH PROPOFOL N/A 10/07/2014   Procedure: COLONOSCOPY WITH PROPOFOL;  Surgeon: [name], MD;  Location: [facility];  Service: Endoscopy;  Laterality: N/A;  Latex  . HERNIA REPAIR  1978    Family Psychiatric History: Reviewed family psychiatric history from my progress note on 04/18/2017  Family History:  Family History  Problem Relation Age of Onset  . Hypertension Mother   . Seizures Brother   . Pancreatic cancer Unknown     Social History: Reviewed social history from my progress note on 04/18/2017 Social History   Socioeconomic History  . Marital status: Married    Spouse name: Not on file  . Number of children: 5  . Years of education: 24  . Highest education level: Not on file  Occupational History  . Occupation: [employer]  Social Needs  . Financial resource strain: Not on file  . Food insecurity:    Worry: Not on file    Inability: Not on file  . Transportation needs:    Medical: Not on file    Non-medical: Not on file  Tobacco Use  . Smoking status: Former Smoker    Years: 15.00  . Smokeless tobacco: Never Used  Substance and Sexual Activity  . Alcohol use:  Not Currently    Alcohol/week: 0.0 standard drinks    Comment: "socially"  . Drug use: No  . Sexual activity: Yes    Birth control/protection: IUD  Lifestyle  . Physical activity:    Days per week: Not on file    Minutes per session: Not on file  . Stress: Not on file  Relationships  . Social connections:    Talks on phone: Not on file    Gets together: Not on file    Attends religious service: Not on file    Active member of club or organization: Not on file    Attends meetings of clubs or organizations: Not on file    Relationship status: Not on file  Other Topics Concern  . Not on file  Social History Narrative   Lives at home w/ her husband and children   Left-handed    Drinks 1 cup of coffee per day    Allergies:  Allergies  Allergen Reactions  . Lithium     Face swelling   . Penicillins Other (See Comments)    Unknown reaction Has patient had a PCN reaction causing immediate rash, facial/tongue/throat swelling, SOB or lightheadedness with hypotension: YES Has patient had a PCN reaction causing severe rash involving mucus membranes or skin necrosis: NO Has patient had a PCN reaction that required hospitalizationNO Has patient had a PCN reaction occurring within the last 10 years: NO If all of the above answers are "NO", then may proceed with Cephalosporin use.  . Shellfish Allergy Swelling    lips  . Latex Rash    Metabolic Disorder Labs: Lab Results  Component Value Date   HGBA1C 5.4 05/09/2015   MPG 108 05/09/2015   No results found for: PROLACTIN Lab Results  Component Value Date   CHOL 128 05/09/2015   TRIG 79 05/09/2015   HDL 41 05/09/2015   CHOLHDL 3.1 05/09/2015   VLDL 16 05/09/2015   LDLCALC 71 05/09/2015   Lab Results  Component Value Date   TSH 2.093 08/26/2017    Therapeutic Level Labs: No results found for: LITHIUM No results found for: VALPROATE No components found for:  CBMZ  Current Medications: Current Outpatient Medications  Medication Sig Dispense Refill  . ARIPiprazole (ABILIFY) 10 MG tablet TAKE 1.5 TABLETS BY MOUTH DAILY 135 tablet 0  . aspirin 81 MG tablet Take 162 mg by mouth daily.    . cetirizine-pseudoephedrine (ZYRTEC-D) 5-120 MG per tablet Take 1 tablet by mouth 2 (two) times daily as needed for allergies.    . Cholecalciferol (VITAMIN D3) 5000 units CAPS Take 1 capsule by mouth daily.    Marland Kitchen Fe Cbn-Fe Gluc-FA-B12-C-DSS (FERRALET 90) 90-1 MG TABS     . hydrochlorothiazide (HYDRODIURIL) 12.5 MG tablet Take 1 tablet (12.5 mg total) by mouth daily. 30 tablet 3  . hydrOXYzine (VISTARIL) 25 MG capsule Take 1 capsule (25 mg total) by mouth every 6 (six) hours as needed for anxiety (severe anxiety,  insomnia). 120 capsule 1  . ibuprofen (ADVIL,MOTRIN) 800 MG tablet Take 1 tablet (800 mg total) by mouth every 8 (eight) hours as needed for moderate pain. 15 tablet 0  . methylPREDNISolone (MEDROL DOSEPAK) 4 MG TBPK tablet Take as directed 21 tablet 0  . mupirocin ointment (BACTROBAN) 2 %     . nitrofurantoin, macrocrystal-monohydrate, (MACROBID) 100 MG capsule Take 1 capsule (100 mg total) by mouth 2 (two) times daily. 20 capsule 0  . PARAGARD INTRAUTERINE COPPER IUD IUD 1 each by Intrauterine  route once.    . phentermine (ADIPEX-P) 37.5 MG tablet Take 1 tablet (37.5 mg total) by mouth daily before breakfast. 30 tablet 1  . sertraline (ZOLOFT) 50 MG tablet Take 1 tablet (50 mg total) by mouth daily. 90 tablet 0  . vitamin B-12 (CYANOCOBALAMIN) 1000 MCG tablet Take 1,000 mcg by mouth daily as needed (energy). Reported on 05/08/2015    . buPROPion (WELLBUTRIN XL) 150 MG 24 hr tablet Take 1 tablet (150 mg total) by mouth daily with breakfast. 30 tablet 0  . zolpidem (AMBIEN) 10 MG tablet Take 0.5-1 tablets (5-10 mg total) by mouth at bedtime as needed for sleep. 15 tablet 1   No current facility-administered medications for this visit.      Musculoskeletal: Strength & Muscle Tone: within normal limits Gait & Station: normal Patient leans: N/A  Psychiatric Specialty Exam: Review of Systems  Psychiatric/Behavioral: Positive for depression.  All other systems reviewed and are negative.   Blood pressure 124/75, pulse 78, temperature 98 F (36.7 C), temperature source Oral, weight 209 lb 3.2 oz (94.9 kg).Body mass index is 32.77 kg/m.  General Appearance: Casual  Eye Contact:  Fair  Speech:  Clear and Coherent  Volume:  Normal  Mood:  Dysphoric  Affect:  Congruent  Thought Process:  Goal Directed and Descriptions of Associations: Intact  Orientation:  Full (Time, Place, and Person)  Thought Content: Logical   Suicidal Thoughts:  No  Homicidal Thoughts:  No  Memory:  Immediate;    Fair Recent;   Fair Remote;   Fair  Judgement:  Fair  Insight:  Fair  Psychomotor Activity:  Normal  Concentration:  Concentration: Fair and Attention Span: Fair  Recall:  [employer] of Knowledge: Fair  Language: Fair  Akathisia:  No  Handed:  Right  AIMS (if indicated): denies stiffness, rigidity  Assets:  Communication Skills Desire for Improvement Social Support  ADL's:  Intact  Cognition: WNL  Sleep:  improved   Screenings: PHQ2-9     Office Visit from 09/09/2017 in [facility] Office Visit from 08/26/2017 in [facility]  PHQ-2 Total Score  4  6  PHQ-9 Total Score  15  21       Assessment and Plan: Brittney Duran is a 47 yr old African-American female who has a history of mood disorder, panic attacks, history of TIA, history of trauma, presented to the clinic today for a follow-up visit.  Patient continues to report some sadness, lack of energy and fatigue which she attributes to her menstrual cycle.  However patient is agreeable to increasing her medication dosage today.  She will continue psychotherapy sessions.  Plan as noted below.  Plan Bipolar disorder Continue Zoloft 50 mg p.o. daily Increase Wellbutrin to XL 150 mg p.o. daily Abilify 15 mg p.o. daily.  Panic disorder Zoloft 50 mg p.o. daily Hydroxyzine 25 mg p.o. 3 times daily as needed Continue CBT with [name]  For insomnia Hydroxyzine as prescribed. Ambien 10 mg p.o. nightly as needed  Follow-up in clinic in 3 weeks or sooner if needed.  More than 50 % of the time was spent for psychoeducation and supportive psychotherapy and care coordination.  This note was generated in part or whole with voice recognition software. Voice recognition is usually quite accurate but there are transcription errors that can and very often do occur. I apologize for any typographical errors that were not detected and corrected.        Brittney Crosley,  MD 11/18/2017, 10:00 AM

## 2017-11-18 ENCOUNTER — Encounter: Payer: Self-pay | Admitting: Psychiatry

## 2017-11-19 ENCOUNTER — Other Ambulatory Visit: Payer: Self-pay | Admitting: Psychiatry

## 2017-11-19 DIAGNOSIS — F3181 Bipolar II disorder: Secondary | ICD-10-CM

## 2017-11-19 DIAGNOSIS — F410 Panic disorder [episodic paroxysmal anxiety] without agoraphobia: Secondary | ICD-10-CM

## 2017-11-20 NOTE — Progress Notes (Signed)
   THERAPIST PROGRESS NOTE  Session Time: 14mn  Participation Level: Active  Behavioral Response: CasualAlertEuthymic  Type of Therapy: Individual Therapy  Treatment Goals addressed: Coping  Interventions: CBT  Summary: Brittney CHRISTENBERRYis a 47y.o. female who presents with continued symptoms of her diagnosis.  Therapist met with Patient in an outpatient setting to assess current mood and assist with making progress towards goals through the use of therapeutic intervention. Therapist did a brief mood check, assessing anger, fear, disgust, excitement, happiness, and sadness.  Patient reports a "soso" mood.  Patient reports that she wants to get better and she is motivated to do so.  Patient reports several set backs and which creates lack of motivation. Encouraged patient to keep trying and used cognitive restructuring.    Suicidal/Homicidal: No  Plan: Return again in 2 weeks.  Diagnosis: Axis I: Bipolar, Depressed    Axis II: No diagnosis    NLubertha South LCSW 10/24/2017

## 2017-11-21 MED ORDER — SERTRALINE HCL 50 MG PO TABS: 50.0000 mg | ORAL_TABLET | Freq: Every day | ORAL | 0 refills | Status: DC

## 2017-12-08 ENCOUNTER — Ambulatory Visit: Payer: 59 | Admitting: Psychiatry

## 2017-12-08 ENCOUNTER — Ambulatory Visit: Payer: 59 | Admitting: Licensed Clinical Social Worker

## 2017-12-09 ENCOUNTER — Other Ambulatory Visit: Payer: Self-pay | Admitting: Psychiatry

## 2017-12-09 DIAGNOSIS — F3181 Bipolar II disorder: Secondary | ICD-10-CM

## 2017-12-26 ENCOUNTER — Ambulatory Visit: Payer: 59 | Admitting: Psychiatry

## 2018-01-02 ENCOUNTER — Ambulatory Visit: Payer: Self-pay | Admitting: Obstetrics and Gynecology

## 2018-01-06 ENCOUNTER — Ambulatory Visit: Payer: 59 | Admitting: Psychiatry

## 2018-01-09 ENCOUNTER — Telehealth: Payer: Self-pay

## 2018-01-09 DIAGNOSIS — F3181 Bipolar II disorder: Secondary | ICD-10-CM

## 2018-01-09 DIAGNOSIS — F410 Panic disorder [episodic paroxysmal anxiety] without agoraphobia: Secondary | ICD-10-CM

## 2018-01-09 MED ORDER — ZOLPIDEM TARTRATE 10 MG PO TABS: 5.0000 mg | ORAL_TABLET | Freq: Every evening | ORAL | 1 refills | Status: DC | PRN

## 2018-01-09 MED ORDER — HYDROXYZINE PAMOATE 25 MG PO CAPS: 25.0000 mg | ORAL_CAPSULE | Freq: Four times a day (QID) | ORAL | 1 refills | Status: DC | PRN

## 2018-01-09 NOTE — Telephone Encounter (Signed)
Sent Ambien and vistaril to pharmacy.
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
## 2018-01-10 ENCOUNTER — Ambulatory Visit: Payer: 59 | Admitting: Psychiatry

## 2018-01-19 ENCOUNTER — Ambulatory Visit: Payer: Self-pay | Admitting: Obstetrics and Gynecology

## 2018-02-13 ENCOUNTER — Other Ambulatory Visit: Payer: Self-pay | Admitting: Psychiatry

## 2018-02-13 DIAGNOSIS — F410 Panic disorder [episodic paroxysmal anxiety] without agoraphobia: Secondary | ICD-10-CM

## 2018-03-20 DIAGNOSIS — Z1231 Encounter for screening mammogram for malignant neoplasm of breast: Secondary | ICD-10-CM | POA: Diagnosis not present

## 2018-03-23 ENCOUNTER — Other Ambulatory Visit: Payer: Self-pay

## 2018-03-23 ENCOUNTER — Ambulatory Visit: Payer: 59 | Admitting: Psychiatry

## 2018-03-23 ENCOUNTER — Encounter: Payer: Self-pay | Admitting: Psychiatry

## 2018-03-23 ENCOUNTER — Ambulatory Visit: Payer: 59 | Admitting: Licensed Clinical Social Worker

## 2018-03-23 VITALS — BP 119/73 | HR 72 | Temp 98.9°F | Wt 205.6 lb

## 2018-03-23 DIAGNOSIS — F410 Panic disorder [episodic paroxysmal anxiety] without agoraphobia: Secondary | ICD-10-CM

## 2018-03-23 DIAGNOSIS — F5105 Insomnia due to other mental disorder: Secondary | ICD-10-CM | POA: Diagnosis not present

## 2018-03-23 DIAGNOSIS — F3181 Bipolar II disorder: Secondary | ICD-10-CM

## 2018-03-23 MED ORDER — SERTRALINE HCL 50 MG PO TABS: 50.0000 mg | ORAL_TABLET | Freq: Every day | ORAL | 0 refills | Status: DC

## 2018-03-23 MED ORDER — ARIPIPRAZOLE 20 MG PO TABS: 20.0000 mg | ORAL_TABLET | Freq: Every day | ORAL | 0 refills | Status: DC
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
## 2018-03-24 ENCOUNTER — Other Ambulatory Visit: Payer: Self-pay

## 2018-03-24 ENCOUNTER — Encounter: Payer: Self-pay | Admitting: Adult Health

## 2018-03-24 ENCOUNTER — Ambulatory Visit: Payer: 59 | Admitting: Adult Health

## 2018-03-24 VITALS — BP 130/88 | HR 72 | Temp 98.1°F | Resp 16 | Ht 67.0 in | Wt 206.0 lb

## 2018-03-24 DIAGNOSIS — J0110 Acute frontal sinusitis, unspecified: Secondary | ICD-10-CM

## 2018-03-24 DIAGNOSIS — F319 Bipolar disorder, unspecified: Secondary | ICD-10-CM | POA: Diagnosis not present

## 2018-03-24 MED ORDER — SULFAMETHOXAZOLE-TRIMETHOPRIM 800-160 MG PO TABS: 1.0000 | ORAL_TABLET | Freq: Two times a day (BID) | ORAL | 0 refills | Status: DC

## 2018-03-24 NOTE — Progress Notes (Signed)
[facility] [address] [place], [place] [address]  Internal MEDICINE  Office Visit Note  Patient Name: Brittney Duran  [id number]  [id number]  Date of Service: 03/24/2018  Chief Complaint  Patient presents with  . Sinusitis    sinus pressure , ears popping , mornings throat is dry and raw      HPI Pt is here for a sick visit.  Pt reports sinus pain and pressure, with sore throat and congestion for the past 4 days.  She reports PND and malaise.  She denies fever/chills. She has taken some OTC medication with minimal relief.      Current Medication:  Outpatient Encounter Medications as of 03/24/2018  Medication Sig Note  . ARIPiprazole (ABILIFY) 20 MG tablet Take 1 tablet (20 mg total) by mouth daily.   Marland Kitchen aspirin 81 MG tablet Take 162 mg by mouth daily.   Marland Kitchen buPROPion (WELLBUTRIN XL) 150 MG 24 hr tablet TAKE 1 TABLET BY MOUTH EVERY DAY WITH BREAKFAST   . cetirizine-pseudoephedrine (ZYRTEC-D) 5-120 MG per tablet Take 1 tablet by mouth 2 (two) times daily as needed for allergies.   . Cholecalciferol (VITAMIN D3) 5000 units CAPS Take 1 capsule by mouth daily.   . hydrOXYzine (VISTARIL) 25 MG capsule TAKE 1 CAPSULE BY MOUTH EVERY 6 HOURS PAN/INSOMNIA   . ibuprofen (ADVIL,MOTRIN) 800 MG tablet Take 1 tablet (800 mg total) by mouth every 8 (eight) hours as needed for moderate pain.   Marland Kitchen PARAGARD INTRAUTERINE COPPER IUD IUD 1 each by Intrauterine route once.   . sertraline (ZOLOFT) 50 MG tablet Take 1 tablet (50 mg total) by mouth daily.   . vitamin B-12 (CYANOCOBALAMIN) 1000 MCG tablet Take 1,000 mcg by mouth daily as needed (energy). Reported on 05/08/2015   . zolpidem (AMBIEN) 10 MG tablet Take 0.5-1 tablets (5-10 mg total) by mouth at bedtime as needed for sleep.   . phentermine (ADIPEX-P) 37.5 MG tablet Take 1 tablet (37.5 mg total) by mouth daily before breakfast. (Patient not taking: Reported on 03/24/2018)   . [DISCONTINUED] Fe Cbn-Fe Gluc-FA-B12-C-DSS (FERRALET 90) 90-1  MG TABS  08/20/2015: Received from: External Pharmacy  . [DISCONTINUED] hydrochlorothiazide (HYDRODIURIL) 12.5 MG tablet Take 1 tablet (12.5 mg total) by mouth daily. (Patient not taking: Reported on 03/24/2018)   . [DISCONTINUED] methylPREDNISolone (MEDROL DOSEPAK) 4 MG TBPK tablet Take as directed (Patient not taking: Reported on 03/24/2018)   . [DISCONTINUED] mupirocin ointment (BACTROBAN) 2 %    . [DISCONTINUED] nitrofurantoin, macrocrystal-monohydrate, (MACROBID) 100 MG capsule Take 1 capsule (100 mg total) by mouth 2 (two) times daily. (Patient not taking: Reported on 03/24/2018)    No facility-administered encounter medications on file as of 03/24/2018.       Medical History: Past Medical History:  Diagnosis Date  . Anemia   . Anxiety    Panic attacks  . Complication of anesthesia    pt reports "local" wears off quickly  . Depression   . Environmental allergies   . Hemorrhoids   . Leaky heart valve   . Sciatica 09/08/2017  . Wears contact lenses      Vital Signs: BP 130/88   Pulse 72   Temp 98.1 F (36.7 C) (Oral)   Resp 16   Ht  (1.702 m)   Wt 206 lb (93.4 kg)   SpO2 99%   BMI 32.26 kg/m    Review of Systems  Constitutional: Negative for chills, fatigue and unexpected weight change.  HENT: Positive for ear  pain, postnasal drip, rhinorrhea, sinus pressure and sinus pain. Negative for congestion, sneezing and sore throat.   Eyes: Negative for photophobia, pain and redness.  Respiratory: Negative for cough, chest tightness and shortness of breath.   Cardiovascular: Negative for chest pain and palpitations.  Gastrointestinal: Negative for abdominal pain, constipation, diarrhea, nausea and vomiting.  Endocrine: Negative.   Genitourinary: Negative for dysuria and frequency.  Musculoskeletal: Negative for arthralgias, back pain, joint swelling and neck pain.  Skin: Negative for rash.  Allergic/Immunologic: Negative.   Neurological: Negative for tremors and numbness.   Hematological: Negative for adenopathy. Does not bruise/bleed easily.  Psychiatric/Behavioral: Negative for behavioral problems and sleep disturbance. The patient is not nervous/anxious.     Physical Exam Vitals signs and nursing note reviewed.  Constitutional:      General: She is not in acute distress.    Appearance: She is well-developed. She is not diaphoretic.  HENT:     Head: Normocephalic and atraumatic.     Comments: Tenderness over maxillary sinus.    Mouth/Throat:     Pharynx: No oropharyngeal exudate.  Eyes:     Pupils: Pupils are equal, round, and reactive to light.  Neck:     Musculoskeletal: Normal range of motion and neck supple.     Thyroid: No thyromegaly.     Vascular: No JVD.     Trachea: No tracheal deviation.  Cardiovascular:     Rate and Rhythm: Normal rate and regular rhythm.     Heart sounds: Normal heart sounds. No murmur. No friction rub. No gallop.   Pulmonary:     Effort: Pulmonary effort is normal. No respiratory distress.     Breath sounds: Normal breath sounds. No wheezing or rales.  Chest:     Chest wall: No tenderness.  Abdominal:     Palpations: Abdomen is soft.     Tenderness: There is no abdominal tenderness. There is no guarding.  Musculoskeletal: Normal range of motion.  Lymphadenopathy:     Cervical: No cervical adenopathy.  Skin:    General: Skin is warm and dry.  Neurological:     Mental Status: She is alert and oriented to person, place, and time.     Cranial Nerves: No cranial nerve deficit.  Psychiatric:        Behavior: Behavior normal.        Thought Content: Thought content normal.        Judgment: Judgment normal.    Assessment/Plan: 1. Acute non-recurrent frontal sinusitis Provided with course of Bactrim DS.  Try to patient take all medication until completion.  Return to clinic if symptoms fail to improve with course of antibiotics.  Continue to drink plenty of fluids and use OTC medications for symptom management. -  sulfamethoxazole-trimethoprim (BACTRIM DS) 800-160 MG tablet; Take 1 tablet by mouth 2 (two) times daily.  Dispense: 20 tablet; Refill: 0  2. Bipolar depression (HCC) Stable, continue current therapy.  General Counseling: Thekla verbalizes understanding of the findings of todays visit and agrees with plan of treatment. I have discussed any further diagnostic evaluation that may be needed or ordered today. We also reviewed her medications today. she has been encouraged to call the office with any questions or concerns that should arise related to todays visit.   No orders of the defined types were placed in this encounter.   No orders of the defined types were placed in this encounter.   Time spent:20 Minutes  This patient was seen by Blima Ledger AGNP-C  in Collaboration with Dr Lyndon Code as a part of collaborative care agreement.  Johnna Acosta AGNP-C Internal Medicine

## 2018-03-24 NOTE — Patient Instructions (Signed)

## 2018-03-29 ENCOUNTER — Encounter: Payer: Self-pay | Admitting: Nurse Practitioner

## 2018-03-29 ENCOUNTER — Ambulatory Visit: Payer: 59 | Admitting: Nurse Practitioner

## 2018-03-29 VITALS — BP 118/70 | HR 66 | Resp 16 | Ht 67.0 in | Wt 206.0 lb

## 2018-03-29 DIAGNOSIS — J0110 Acute frontal sinusitis, unspecified: Secondary | ICD-10-CM

## 2018-03-29 DIAGNOSIS — F319 Bipolar disorder, unspecified: Secondary | ICD-10-CM

## 2018-03-29 DIAGNOSIS — Z6832 Body mass index (BMI) 32.0-32.9, adult: Secondary | ICD-10-CM

## 2018-03-29 MED ORDER — PHENTERMINE HCL 37.5 MG PO TABS: 37.5000 mg | ORAL_TABLET | Freq: Every day | ORAL | 1 refills | Status: DC

## 2018-03-29 NOTE — Progress Notes (Signed)
[facility] [address] [place], [place] [address]  Internal MEDICINE  Office Visit Note  Patient Name: Brittney Duran  [id number]  [id number]  Date of Service: 03/29/2018  Chief Complaint  Patient presents with  . Medical Management of Chronic Issues    weight  management    The patient is here to restart weight management program. She has taken phentermine for this in the past and done well with it. She is getting frequent flare ups of lower back pain with sciatica, which she believes, is worsened by weight gain. She is also taking several medications, prescribed by her psychiatrist. A few of these can lead to weight gain.  Treated last week for sinus infection. Continues to take prescribed antibiotics. Feels much better, though still congested and fatigued .      Current Medication: Outpatient Encounter Medications as of 03/29/2018  Medication Sig  . ARIPiprazole (ABILIFY) 20 MG tablet Take 1 tablet (20 mg total) by mouth daily.  Marland Kitchen aspirin 81 MG tablet Take 162 mg by mouth daily.  Marland Kitchen buPROPion (WELLBUTRIN XL) 150 MG 24 hr tablet TAKE 1 TABLET BY MOUTH EVERY DAY WITH BREAKFAST  . cetirizine-pseudoephedrine (ZYRTEC-D) 5-120 MG per tablet Take 1 tablet by mouth 2 (two) times daily as needed for allergies.  . Cholecalciferol (VITAMIN D3) 5000 units CAPS Take 1 capsule by mouth daily.  . hydrOXYzine (VISTARIL) 25 MG capsule TAKE 1 CAPSULE BY MOUTH EVERY 6 HOURS PAN/INSOMNIA  . ibuprofen (ADVIL,MOTRIN) 800 MG tablet Take 1 tablet (800 mg total) by mouth every 8 (eight) hours as needed for moderate pain.  Marland Kitchen PARAGARD INTRAUTERINE COPPER IUD IUD 1 each by Intrauterine route once.  . phentermine (ADIPEX-P) 37.5 MG tablet Take 1 tablet (37.5 mg total) by mouth daily before breakfast.  . sertraline (ZOLOFT) 50 MG tablet Take 1 tablet (50 mg total) by mouth daily.  Marland Kitchen sulfamethoxazole-trimethoprim (BACTRIM DS) 800-160 MG tablet Take 1 tablet by mouth 2 (two) times daily.  .  vitamin B-12 (CYANOCOBALAMIN) 1000 MCG tablet Take 1,000 mcg by mouth daily as needed (energy). Reported on 05/08/2015  . zolpidem (AMBIEN) 10 MG tablet Take 0.5-1 tablets (5-10 mg total) by mouth at bedtime as needed for sleep.  . [DISCONTINUED] phentermine (ADIPEX-P) 37.5 MG tablet Take 1 tablet (37.5 mg total) by mouth daily before breakfast.   No facility-administered encounter medications on file as of 03/29/2018.     Surgical History: Past Surgical History:  Procedure Laterality Date  . Caesaran section  1989  . COLONOSCOPY WITH PROPOFOL N/A 10/07/2014   Procedure: COLONOSCOPY WITH PROPOFOL;  Surgeon: [name], MD;  [facility];  Service: Endoscopy;  Laterality: N/A;  Latex  . HERNIA REPAIR  1978    Medical History: Past Medical History:  Diagnosis Date  . Anemia   . Anxiety    Panic attacks  . Complication of anesthesia    pt reports "local" wears off quickly  . Depression   . Environmental allergies   . Hemorrhoids   . Leaky heart valve   . Sciatica 09/08/2017  . Wears contact lenses     Family History: Family History  Problem Relation Age of Onset  . Hypertension Mother   . Seizures Brother   . Pancreatic cancer Unknown     Social History   Socioeconomic History  . Marital status: Married    Spouse name: Not on file  . Number of children: 5  . Years of education: 16  . Highest  education level: Not on file  Occupational History  . Occupation: [employer]  Social Needs  . Financial resource strain: Not on file  . Food insecurity:    Worry: Not on file    Inability: Not on file  . Transportation needs:    Medical: Not on file    Non-medical: Not on file  Tobacco Use  . Smoking status: Former Smoker    Years: 15.00  . Smokeless tobacco: Never Used  Substance and Sexual Activity  . Alcohol use: Not Currently    Alcohol/week: 0.0 standard drinks    Comment: "socially"  . Drug use: No  . Sexual activity: Yes    Birth  control/protection: I.U.D.  Lifestyle  . Physical activity:    Days per week: Not on file    Minutes per session: Not on file  . Stress: Not on file  Relationships  . Social connections:    Talks on phone: Not on file    Gets together: Not on file    Attends religious service: Not on file    Active member of club or organization: Not on file    Attends meetings of clubs or organizations: Not on file    Relationship status: Not on file  . Intimate partner violence:    Fear of current or ex partner: Not on file    Emotionally abused: Not on file    Physically abused: Not on file    Forced sexual activity: Not on file  Other Topics Concern  . Not on file  Social History Narrative   Lives at home w/ her husband and children   Left-handed   Drinks 1 cup of coffee per day      Review of Systems  Constitutional: Positive for fatigue. Negative for activity change, chills and unexpected weight change.       Weight maintenance since her last visit .  HENT: Positive for congestion, postnasal drip and rhinorrhea. Negative for sneezing and sore throat.   Respiratory: Negative for cough, chest tightness, shortness of breath and wheezing.   Cardiovascular: Negative for chest pain and palpitations.  Gastrointestinal: Negative for abdominal pain, constipation, diarrhea, nausea and vomiting.  Endocrine: Negative for cold intolerance, heat intolerance, polydipsia, polyphagia and polyuria.  Musculoskeletal: Negative for arthralgias, back pain, joint swelling and neck pain.  Skin: Negative for rash.  Allergic/Immunologic: Negative for environmental allergies.  Neurological: Negative for dizziness, tremors, numbness and headaches.  Hematological: Negative for adenopathy. Does not bruise/bleed easily.  Psychiatric/Behavioral: Positive for dysphoric mood. Negative for behavioral problems (Depression), sleep disturbance and suicidal ideas. The patient is nervous/anxious.        Patient sees  psychiatry on routine basis.     Today's Vitals   03/29/18 0926  BP: 118/70  Pulse: 66  Resp: 16  SpO2: 100%  Weight: 206 lb (93.4 kg)  Height:  (1.702 m)   Body mass index is 32.26 kg/m.  Physical Exam Vitals signs and nursing note reviewed.  Constitutional:      General: She is not in acute distress.    Appearance: Normal appearance. She is well-developed. She is not diaphoretic.  HENT:     Head: Normocephalic and atraumatic.     Nose: Nose normal.     Mouth/Throat:     Pharynx: No oropharyngeal exudate.  Eyes:     Conjunctiva/sclera: Conjunctivae normal.     Pupils: Pupils are equal, round, and reactive to light.  Neck:     Musculoskeletal: Normal range of  motion and neck supple.     Thyroid: No thyromegaly.     Vascular: No JVD.     Trachea: No tracheal deviation.  Cardiovascular:     Rate and Rhythm: Normal rate and regular rhythm.     Heart sounds: Normal heart sounds. No murmur. No friction rub. No gallop.   Pulmonary:     Effort: Pulmonary effort is normal. No respiratory distress.     Breath sounds: Normal breath sounds. No wheezing or rales.  Chest:     Chest wall: No tenderness.     Breasts:        Right: No inverted nipple, mass, nipple discharge, skin change or tenderness.        Left: No inverted nipple, mass, nipple discharge, skin change or tenderness.  Abdominal:     General: Bowel sounds are normal.     Palpations: Abdomen is soft.     Tenderness: There is no abdominal tenderness.  Musculoskeletal: Normal range of motion.  Lymphadenopathy:     Cervical: No cervical adenopathy.  Skin:    General: Skin is warm and dry.     Capillary Refill: Capillary refill takes less than 2 seconds.     Comments: Mild facial acne.   Neurological:     Mental Status: She is alert and oriented to person, place, and time.     Cranial Nerves: No cranial nerve deficit.  Psychiatric:        Mood and Affect: Mood is depressed.        Speech: Speech normal.         Behavior: Behavior normal.        Thought Content: Thought content normal.        Judgment: Judgment normal.   Assessment/Plan: 1. Body mass index (bmi) 32.0-32.9, adult Restart phentermine 37.5mg  tablet daily. Reduce calorie intake to 1500 calories per day and incorporate exercise into daily routine.  - phentermine (ADIPEX-P) 37.5 MG tablet; Take 1 tablet (37.5 mg total) by mouth daily before breakfast.  Dispense: 30 tablet; Refill: 1  2. Acute non-recurrent frontal sinusitis Improving. Continue antibiotics until complete.   3. Bipolar depression (HCC) Regular visits with psychiatry as scheduled.   General Counseling: Sreenidhi verbalizes understanding of the findings of todays visit and agrees with plan of treatment. I have discussed any further diagnostic evaluation that may be needed or ordered today. We also reviewed her medications today. she has been encouraged to call the office with any questions or concerns that should arise related to todays visit.   There is a liability release in patients' chart. There has been a 10 minute discussion about the side effects including but not limited to elevated blood pressure, anxiety, lack of sleep and dry mouth. Pt understands and will like to start/continue on appetite suppressant at this time. There will be one month RX given at the time of visit with proper follow up. Nova diet plan with restricted calories is given to the pt. Pt understands and agrees with  plan of treatment  This patient was seen by Vincent Gros FNP Collaboration with Dr Lyndon Code as a part of collaborative care agreement  Meds ordered this encounter  Medications  . phentermine (ADIPEX-P) 37.5 MG tablet    Sig: Take 1 tablet (37.5 mg total) by mouth daily before breakfast.    Dispense:  30 tablet    Refill:  1    Order Specific Question:   Supervising Provider    Answer:   Welton Flakes,  Shannan Harper [[id number]]    Time spent: 25 Minutes      Dr Lyndon Code Internal  medicine

## 2018-04-06 ENCOUNTER — Other Ambulatory Visit: Payer: Self-pay

## 2018-04-06 ENCOUNTER — Ambulatory Visit: Payer: 59 | Admitting: Licensed Clinical Social Worker

## 2018-04-06 DIAGNOSIS — F3181 Bipolar II disorder: Secondary | ICD-10-CM | POA: Diagnosis not present

## 2018-04-13 ENCOUNTER — Ambulatory Visit (INDEPENDENT_AMBULATORY_CARE_PROVIDER_SITE_OTHER): Payer: 59 | Admitting: Psychiatry

## 2018-04-13 ENCOUNTER — Other Ambulatory Visit: Payer: Self-pay

## 2018-04-13 ENCOUNTER — Encounter: Payer: Self-pay | Admitting: Psychiatry

## 2018-04-13 DIAGNOSIS — F410 Panic disorder [episodic paroxysmal anxiety] without agoraphobia: Secondary | ICD-10-CM | POA: Diagnosis not present

## 2018-04-13 DIAGNOSIS — F3181 Bipolar II disorder: Secondary | ICD-10-CM

## 2018-04-13 DIAGNOSIS — F5105 Insomnia due to other mental disorder: Secondary | ICD-10-CM

## 2018-04-13 MED ORDER — MIRTAZAPINE 7.5 MG PO TABS: 7.5000 mg | ORAL_TABLET | Freq: Every day | ORAL | 0 refills | Status: DC

## 2018-04-13 NOTE — Patient Instructions (Signed)
Mirtazapine tablets  What is this medicine?  MIRTAZAPINE (mir TAZ a peen) is used to treat depression.  This medicine may be used for other purposes; ask your health care provider or pharmacist if you have questions.  COMMON BRAND NAME(S): Remeron  What should I tell my health care provider before I take this medicine?  They need to know if you have any of these conditions:  -bipolar disorder  -glaucoma  -kidney disease  -liver disease  -suicidal thoughts  -an unusual or allergic reaction to mirtazapine, other medicines, foods, dyes, or preservatives  -pregnant or trying to get pregnant  -breast-feeding  How should I use this medicine?  Take this medicine by mouth with a glass of water. Follow the directions on the prescription label. Take your medicine at regular intervals. Do not take your medicine more often than directed. Do not stop taking this medicine suddenly except upon the advice of your doctor. Stopping this medicine too quickly may cause serious side effects or your condition may worsen.  A special MedGuide will be given to you by the pharmacist with each prescription and refill. Be sure to read this information carefully each time.  Talk to your pediatrician regarding the use of this medicine in children. Special care may be needed.  Overdosage: If you think you have taken too much of this medicine contact a poison control center or emergency room at once.  NOTE: This medicine is only for you. Do not share this medicine with others.  What if I miss a dose?  If you miss a dose, take it as soon as you can. If it is almost time for your next dose, take only that dose. Do not take double or extra doses.  What may interact with this medicine?  Do not take this medicine with any of the following medications:  -linezolid  -MAOIs like Carbex, Eldepryl, Marplan, Nardil, and Parnate  -methylene blue (injected into a vein)  This medicine may also interact with the following medications:  -alcohol  -antiviral  medicines for HIV or AIDS  -certain medicines that treat or prevent blood clots like warfarin  -certain medicines for depression, anxiety, or psychotic disturbances  -certain medicines for fungal infections like ketoconazole and itraconazole  -certain medicines for migraine headache like almotriptan, eletriptan, frovatriptan, naratriptan, rizatriptan, sumatriptan, zolmitriptan  -certain medicines for seizures like carbamazepine or phenytoin  -certain medicines for sleep  -cimetidine  -erythromycin  -fentanyl  -lithium  -medicines for blood pressure  -nefazodone  -rasagiline  -rifampin  -supplements like St. John's wort, kava kava, valerian  -tramadol  -tryptophan  This list may not describe all possible interactions. Give your health care provider a list of all the medicines, herbs, non-prescription drugs, or dietary supplements you use. Also tell them if you smoke, drink alcohol, or use illegal drugs. Some items may interact with your medicine.  What should I watch for while using this medicine?  Tell your doctor if your symptoms do not get better or if they get worse. Visit your doctor or health care professional for regular checks on your progress. Because it may take several weeks to see the full effects of this medicine, it is important to continue your treatment as prescribed by your doctor.  Patients and their families should watch out for new or worsening thoughts of suicide or depression. Also watch out for sudden changes in feelings such as feeling anxious, agitated, panicky, irritable, hostile, aggressive, impulsive, severely restless, overly excited and hyperactive, or not being   able to sleep. If this happens, especially at the beginning of treatment or after a change in dose, call your health care professional.  You may get drowsy or dizzy. Do not drive, use machinery, or do anything that needs mental alertness until you know how this medicine affects you. Do not stand or sit up quickly, especially if  you are an older patient. This reduces the risk of dizzy or fainting spells. Alcohol may interfere with the effect of this medicine. Avoid alcoholic drinks.  This medicine may cause dry eyes and blurred vision. If you wear contact lenses you may feel some discomfort. Lubricating drops may help. See your eye doctor if the problem does not go away or is severe.  Your mouth may get dry. Chewing sugarless gum or sucking hard candy, and drinking plenty of water may help. Contact your doctor if the problem does not go away or is severe.  What side effects may I notice from receiving this medicine?  Side effects that you should report to your doctor or health care professional as soon as possible:  -allergic reactions like skin rash, itching or hives, swelling of the face, lips, or tongue  -anxious  -changes in vision  -chest pain  -confusion  -elevated mood, decreased need for sleep, racing thoughts, impulsive behavior  -eye pain  -fast, irregular heartbeat  -feeling faint or lightheaded, falls  -feeling agitated, angry, or irritable  -fever or chills, sore throat  -hallucination, loss of contact with reality  -loss of balance or coordination  -mouth sores  -redness, blistering, peeling or loosening of the skin, including inside the mouth  -restlessness, pacing, inability to keep still  -seizures  -stiff muscles  -suicidal thoughts or other mood changes  -trouble passing urine or change in the amount of urine  -trouble sleeping  -unusual bleeding or bruising  -unusually weak or tired  -vomiting  Side effects that usually do not require medical attention (report to your doctor or health care professional if they continue or are bothersome):  -change in appetite  -constipation  -dizziness  -dry mouth  -muscle aches or pains  -nausea  -tired  -weight gain  This list may not describe all possible side effects. Call your doctor for medical advice about side effects. You may report side effects to FDA at 1-800-FDA-1088.  Where  should I keep my medicine?  Keep out of the reach of children.  Store at room temperature between 15 and 30 degrees C (59 and 86 degrees F) Protect from light and moisture. Throw away any unused medicine after the expiration date.  NOTE: This sheet is a summary. It may not cover all possible information. If you have questions about this medicine, talk to your doctor, pharmacist, or health care provider.  © 2019 [employer]/Gold Standard (2015-06-05 17:30:45)
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
## 2018-04-27 ENCOUNTER — Encounter: Payer: Self-pay | Admitting: Psychiatry

## 2018-04-27 ENCOUNTER — Ambulatory Visit (INDEPENDENT_AMBULATORY_CARE_PROVIDER_SITE_OTHER): Payer: 59 | Admitting: Psychiatry

## 2018-04-27 ENCOUNTER — Other Ambulatory Visit: Payer: Self-pay

## 2018-04-27 ENCOUNTER — Ambulatory Visit: Payer: 59 | Admitting: Psychiatry

## 2018-04-27 DIAGNOSIS — F3181 Bipolar II disorder: Secondary | ICD-10-CM

## 2018-04-27 DIAGNOSIS — F5105 Insomnia due to other mental disorder: Secondary | ICD-10-CM

## 2018-04-27 DIAGNOSIS — F410 Panic disorder [episodic paroxysmal anxiety] without agoraphobia: Secondary | ICD-10-CM

## 2018-04-27 DIAGNOSIS — Z7689 Persons encountering health services in other specified circumstances: Secondary | ICD-10-CM

## 2018-04-27 MED ORDER — MIRTAZAPINE 15 MG PO TABS: 15.0000 mg | ORAL_TABLET | Freq: Every day | ORAL | 0 refills | Status: DC

## 2018-04-27 NOTE — Progress Notes (Signed)
Tc on  04-27-18 @ 8:59 pt medical and surgical hx was reviewed with no changes. Pt allergies were reviewed with no changes,  Pt medications and pharmacy was reviewed with updates. No vitals taken phone consult.

## 2018-04-27 NOTE — Progress Notes (Signed)
Virtual Visit via Telephone Note  I connected with Brittney Duran on 04/27/18 at  9:20 AM EDT by telephone and verified that I am speaking with the correct person using two identifiers.   I discussed the limitations, risks, security and privacy concerns of performing an evaluation and management service by telephone and the availability of in person appointments. I also discussed with the patient that there may be a patient responsible charge related to this service. The patient expressed understanding and agreed to proceed.   I discussed the assessment and treatment plan with the patient. The patient was provided an opportunity to ask questions and all were answered. The patient agreed with the plan and demonstrated an understanding of the instructions.   The patient was advised to call back or seek an in-person evaluation if the symptoms worsen or if the condition fails to improve as anticipated.  I provided 15 minutes of non-face-to-face time during this encounter.   Brittney Longs, MD  [facility] MD OP Progress Note  04/27/2018 12:46 PM Brittney Duran  MRN:  [id number]  Chief Complaint:  Chief Complaint    Follow-up     HPI: Brittney Duran is a 48 year old African-American female, married, unemployed, lives in [place], has a history of bipolar disorder, panic disorder, insomnia, history of TIA was evaluated by telemedicine today.  Patient today reports she continues to be anxious, more so because of the COVID-19 crisis that is going on.  She reports her dad works as an Charity fundraiser in [employer] and works in the emergency department.  She reports that makes her anxious a lot since she has been reading a lot about what is happening there.  She reports she has been reaching out to her friends.  She reports her mother comes over to help her.  Her husband comes back home every day however has to leave early in the morning.  She reports the children are currently being homeschooled and she is doing the best  she  can.  She reports she however misses going out and is hoping all this gets back to normal soon.  She reports sleep has improved however there are times when she wakes up.  Patient is tolerating the mirtazapine well.  Patient denies any suicidality at this time however reports that at times when she has thought about not really caring whether she wakes up the next day or not.  She however denies any active thoughts.  Patient is compliant with her medications as prescribed and denies any side effects.  Patient reports her memory problems have improved now that she is not on the Ambien anymore.   Visit Diagnosis:    ICD-10-CM   1. Bipolar 2 disorder, major depressive episode (HCC) F31.81   2. Panic disorder F41.0 mirtazapine (REMERON) 15 MG tablet  3. Insomnia due to mental condition F51.05 mirtazapine (REMERON) 15 MG tablet    Past Psychiatric History: I have reviewed past psychiatric history from my progress note on 04/18/2017.  Past Medical History:  Past Medical History:  Diagnosis Date  . Anemia   . Anxiety    Panic attacks  . Complication of anesthesia    pt reports "local" wears off quickly  . Depression   . Environmental allergies   . Hemorrhoids   . Leaky heart valve   . Sciatica 09/08/2017  . Wears contact lenses     Past Surgical History:  Procedure Laterality Date  . Caesaran section  1989  . COLONOSCOPY WITH PROPOFOL N/A 10/07/2014  Procedure: COLONOSCOPY WITH PROPOFOL;  Surgeon: Midge Miniumarren Wohl, MD;  [facility];  Service: Endoscopy;  Laterality: N/A;  Latex  . HERNIA REPAIR  1978    Family Psychiatric History: I have reviewed family psychiatric history from my progress note on 04/18/2017. Family History:  Family History  Problem Relation Age of Onset  . Hypertension Mother   . Seizures Brother   . Pancreatic cancer Other     Social History: Reviewed social history from my progress note on 04/18/2017. Social History   Socioeconomic History   . Marital status: Married    Spouse name: Brittney Duran  . Number of children: 5  . Years of education: 3016  . Highest education level: Some college, no degree  Occupational History  . Occupation: [employer]  Social Needs  . Financial resource strain: Not very hard  . Food insecurity:    Worry: Sometimes true    Inability: Sometimes true  . Transportation needs:    Medical: No    Non-medical: No  Tobacco Use  . Smoking status: Former Smoker    Years: 15.00  . Smokeless tobacco: Never Used  Substance and Sexual Activity  . Alcohol use: Not Currently    Alcohol/week: 0.0 standard drinks    Comment: "socially"  . Drug use: No  . Sexual activity: Yes    Birth control/protection: I.U.D.  Lifestyle  . Physical activity:    Days per week: 0 days    Minutes per session: 0 min  . Stress: Rather much  Relationships  . Social connections:    Talks on phone: Not on file    Gets together: Not on file    Attends religious service: More than 4 times per year    Active member of club or organization: Yes    Attends meetings of clubs or organizations: More than 4 times per year    Relationship status: Married  Other Topics Concern  . Not on file  Social History Narrative   Lives at home w/ her husband and children   Left-handed   Drinks 1 cup of coffee per day    Allergies:  Allergies  Allergen Reactions  . Lithium     Face swelling   . Penicillins Other (See Comments)    Unknown reaction Has patient had a PCN reaction causing immediate rash, facial/tongue/throat swelling, SOB or lightheadedness with hypotension: YES Has patient had a PCN reaction causing severe rash involving mucus membranes or skin necrosis: NO Has patient had a PCN reaction that required hospitalizationNO Has patient had a PCN reaction occurring within the last 10 years: NO If all of the above answers are "NO", then may proceed with Cephalosporin use.  . Shellfish Allergy Swelling    lips  . Latex Rash     Metabolic Disorder Labs: Lab Results  Component Value Date   HGBA1C 5.4 05/09/2015   MPG 108 05/09/2015   No results found for: PROLACTIN Lab Results  Component Value Date   CHOL 128 05/09/2015   TRIG 79 05/09/2015   HDL 41 05/09/2015   CHOLHDL 3.1 05/09/2015   VLDL 16 05/09/2015   LDLCALC 71 05/09/2015   Lab Results  Component Value Date   TSH 2.093 08/26/2017    Therapeutic Level Labs: No results found for: LITHIUM No results found for: VALPROATE No components found for:  CBMZ  Current Medications: Current Outpatient Medications  Medication Sig Dispense Refill  . ARIPiprazole (ABILIFY) 20 MG tablet Take 1 tablet (20 mg total) by  mouth daily. 90 tablet 0  . aspirin 81 MG tablet Take 162 mg by mouth daily.    Marland Kitchen buPROPion (WELLBUTRIN XL) 150 MG 24 hr tablet TAKE 1 TABLET BY MOUTH EVERY DAY WITH BREAKFAST 90 tablet 1  . cetirizine-pseudoephedrine (ZYRTEC-D) 5-120 MG per tablet Take 1 tablet by mouth 2 (two) times daily as needed for allergies.    . Cholecalciferol (VITAMIN D3) 5000 units CAPS Take 1 capsule by mouth daily.    . hydrOXYzine (VISTARIL) 25 MG capsule TAKE 1 CAPSULE BY MOUTH EVERY 6 HOURS PAN/INSOMNIA 360 capsule 1  . ibuprofen (ADVIL,MOTRIN) 800 MG tablet Take 1 tablet (800 mg total) by mouth every 8 (eight) hours as needed for moderate pain. 15 tablet 0  . PARAGARD INTRAUTERINE COPPER IUD IUD 1 each by Intrauterine route once.    . phentermine (ADIPEX-P) 37.5 MG tablet Take 1 tablet (37.5 mg total) by mouth daily before breakfast. 30 tablet 1  . vitamin B-12 (CYANOCOBALAMIN) 1000 MCG tablet Take 1,000 mcg by mouth daily as needed (energy). Reported on 05/08/2015    . mirtazapine (REMERON) 15 MG tablet Take 1 tablet (15 mg total) by mouth at bedtime. 90 tablet 0   No current facility-administered medications for this visit.      Musculoskeletal: Strength & Muscle Tone: UTA Gait & Station: UTA Patient leans: N/A  Psychiatric Specialty Exam: Review of  Systems  Psychiatric/Behavioral: The patient is nervous/anxious.   All other systems reviewed and are negative.   There were no vitals taken for this visit.There is no height or weight on file to calculate BMI.  General Appearance: Casual  Eye Contact:  Fair  Speech:  Clear and Coherent  Volume:  Normal  Mood:  Anxious  Affect:  Appropriate  Thought Process:  Goal Directed and Descriptions of Associations: Intact  Orientation:  Full (Time, Place, and Person)  Thought Content: Logical   Suicidal Thoughts:  No  Homicidal Thoughts:  No  Memory:  Immediate;   Fair Recent;   Fair Remote;   Fair  Judgement:  Fair  Insight:  Fair  Psychomotor Activity:  Normal  Concentration:  Concentration: Fair and Attention Span: Fair  Recall:  [employer] of Knowledge: Fair  Language: Fair  Akathisia:  No  Handed:  Right  AIMS (if indicated): UTA  Assets:  Communication Skills Desire for Improvement Social Support  ADL's:  Intact  Cognition: WNL  Sleep:  Fair   Screenings: PHQ2-9     [facility] Visit from 03/24/2018 in [facility], [facility] Office Visit from 09/09/2017 in [facility], [facility] Office Visit from 08/26/2017 in [facility], [facility]  PHQ-2 Total Score  0  4  6  PHQ-9 Total Score  -  15  21       Assessment and Plan: Sylvan is a 48 year old African-American female who has a history of bipolar disorder, panic attacks, insomnia, history of TIA, history of trauma was evaluated by telemedicine today.  Patient continues to be anxious, due to several psychosocial stressors like her own health issues, health problems of her daughter, the coronavirus outbreak, worries about her dad who is an Charity fundraiser in [place].  Patient however is tolerating medications and reports being compliant on it.  She continues to be motivated to stay in treatment.  Discussed readjustment of medications as noted below.  Plan Bipolar disorder- some progress Abilify 20 mg p.o. daily. Wellbutrin XL  150 mg p.o. daily Increase mirtazapine to 15 mg p.o. nightly.  Panic disorder-some progress Continue CBT with [name] Hydroxyzine as needed.  For insomnia- progressing Increase mirtazapine to 15 mg p.o. nightly.  Follow-up in clinic in 4 weeks or sooner if needed.  I have spent atleast 15 minutes non face to face with patient today. More than 50 % of the time was spent for psychoeducation and supportive psychotherapy and care coordination.  This note was generated in part or whole with voice recognition software. Voice recognition is usually quite accurate but there are transcription errors that can and very often do occur. I apologize for any typographical errors that were not detected and corrected.        Brittney Longs, MD 04/27/2018, 12:46 PM

## 2018-05-01 ENCOUNTER — Other Ambulatory Visit: Payer: Self-pay

## 2018-05-02 ENCOUNTER — Other Ambulatory Visit: Payer: Self-pay

## 2018-05-02 ENCOUNTER — Encounter: Payer: Self-pay | Admitting: Nurse Practitioner

## 2018-05-02 ENCOUNTER — Telehealth: Payer: Self-pay

## 2018-05-02 ENCOUNTER — Ambulatory Visit: Payer: 59 | Admitting: Nurse Practitioner

## 2018-05-02 VITALS — Temp 97.1°F | Resp 16 | Ht 67.0 in

## 2018-05-02 DIAGNOSIS — J0110 Acute frontal sinusitis, unspecified: Secondary | ICD-10-CM | POA: Diagnosis not present

## 2018-05-02 DIAGNOSIS — Z6832 Body mass index (BMI) 32.0-32.9, adult: Secondary | ICD-10-CM

## 2018-05-02 MED ORDER — AZITHROMYCIN 250 MG PO TABS: ORAL_TABLET | ORAL | 0 refills | Status: DC

## 2018-05-02 MED ORDER — PHENTERMINE HCL 37.5 MG PO TABS: 37.5000 mg | ORAL_TABLET | Freq: Every day | ORAL | 1 refills | Status: DC

## 2018-05-02 NOTE — Telephone Encounter (Signed)
lmom to call us back

## 2018-05-02 NOTE — Progress Notes (Signed)
[facility] [address] [place], [place] [address]  Internal MEDICINE  Telephone Visit  Patient Name: Brittney Duran  191478Nov 25, 2072  [id number]  Date of Service: 05/15/2018  I connected with the patient at 1043am by webcam and verified the patients identity using two identifiers.   I discussed the limitations, risks, security and privacy concerns of performing an evaluation and management service by webcam and the availability of in person appointments. I also discussed with the patient that there may be a patient responsible charge related to the service.  The patient expressed understanding and agrees to proceed.    Chief Complaint  Patient presents with  . Telephone Assessment  . Telephone Screen  . Wheezing  . Sinusitis  . Cough    The patient has been contacted via webcam for follow up visit due to concerns for spread of novel coronavirus. She is complaining of post nasal drip, chest congestion, wheezing, and cough. Has been going on for past several days and has gradually been getting worse. She has been taking Zyrtec D without relief of her symptoms. seh denies headahce, fever, or body aches. She denies nausea or vomiting.       Current Medication: Outpatient Encounter Medications as of 05/02/2018  Medication Sig  . ARIPiprazole (ABILIFY) 20 MG tablet Take 1 tablet (20 mg total) by mouth daily.  Marland Kitchen. aspirin 81 MG tablet Take 162 mg by mouth daily.  Marland Kitchen. buPROPion (WELLBUTRIN XL) 150 MG 24 hr tablet TAKE 1 TABLET BY MOUTH EVERY DAY WITH BREAKFAST  . cetirizine-pseudoephedrine (ZYRTEC-D) 5-120 MG per tablet Take 1 tablet by mouth 2 (two) times daily as needed for allergies.  . Cholecalciferol (VITAMIN D3) 5000 units CAPS Take 1 capsule by mouth daily.  . hydrOXYzine (VISTARIL) 25 MG capsule TAKE 1 CAPSULE BY MOUTH EVERY 6 HOURS PAN/INSOMNIA  . ibuprofen (ADVIL,MOTRIN) 800 MG tablet Take 1 tablet (800 mg total) by mouth every 8 (eight) hours as needed for moderate pain.   . mirtazapine (REMERON) 15 MG tablet Take 1 tablet (15 mg total) by mouth at bedtime.  Marland Kitchen. PARAGARD INTRAUTERINE COPPER IUD IUD 1 each by Intrauterine route once.  . phentermine (ADIPEX-P) 37.5 MG tablet Take 1 tablet (37.5 mg total) by mouth daily before breakfast.  . vitamin B-12 (CYANOCOBALAMIN) 1000 MCG tablet Take 1,000 mcg by mouth daily as needed (energy). Reported on 05/08/2015  . [DISCONTINUED] phentermine (ADIPEX-P) 37.5 MG tablet Take 1 tablet (37.5 mg total) by mouth daily before breakfast.  . azithromycin (ZITHROMAX) 250 MG tablet z-pack - take as directed for 5 days   No facility-administered encounter medications on file as of 05/02/2018.     Surgical History: Past Surgical History:  Procedure Laterality Date  . Caesaran section  1989  . COLONOSCOPY WITH PROPOFOL N/A 10/07/2014   Procedure: COLONOSCOPY WITH PROPOFOL;  Surgeon: Midge Miniumarren Wohl, MD;  [facility];  Service: Endoscopy;  Laterality: N/A;  Latex  . HERNIA REPAIR  1978    Medical History: Past Medical History:  Diagnosis Date  . Anemia   . Anxiety    Panic attacks  . Complication of anesthesia    pt reports "local" wears off quickly  . Depression   . Environmental allergies   . Hemorrhoids   . Leaky heart valve   . Sciatica 09/08/2017  . Wears contact lenses     Family History: Family History  Problem Relation Age of Onset  . Hypertension Mother   . Seizures Brother   .  Pancreatic cancer Other     Social History   Socioeconomic History  . Marital status: Married    Spouse name: duglaus Burkard  . Number of children: 5  . Years of education: 53  . Highest education level: Some college, no degree  Occupational History  . Occupation: [employer]  Social Needs  . Financial resource strain: Not very hard  . Food insecurity:    Worry: Sometimes true    Inability: Sometimes true  . Transportation needs:    Medical: No    Non-medical: No  Tobacco Use  . Smoking status: Former Smoker     Years: 15.00  . Smokeless tobacco: Never Used  Substance and Sexual Activity  . Alcohol use: Not Currently    Alcohol/week: 0.0 standard drinks    Comment: "socially"  . Drug use: No  . Sexual activity: Yes    Birth control/protection: I.U.D.  Lifestyle  . Physical activity:    Days per week: 0 days    Minutes per session: 0 min  . Stress: Rather much  Relationships  . Social connections:    Talks on phone: Not on file    Gets together: Not on file    Attends religious service: More than 4 times per year    Active member of club or organization: Yes    Attends meetings of clubs or organizations: More than 4 times per year    Relationship status: Married  . Intimate partner violence:    Fear of current or ex partner: No    Emotionally abused: No    Physically abused: No    Forced sexual activity: No  Other Topics Concern  . Not on file  Social History Narrative   Lives at home w/ her husband and children   Left-handed   Drinks 1 cup of coffee per day      Review of Systems  Constitutional: Positive for fatigue. Negative for chills and fever.  HENT: Positive for congestion, ear pain, postnasal drip, rhinorrhea and sinus pain. Negative for sore throat and voice change.   Respiratory: Positive for cough and wheezing.   Cardiovascular: Negative for chest pain and palpitations.  Gastrointestinal: Negative for nausea and vomiting.  Musculoskeletal: Negative for arthralgias and myalgias.  Skin: Negative.   Allergic/Immunologic: Positive for environmental allergies.  Neurological: Positive for headaches.  Hematological: Negative for adenopathy.    Today's Vitals   05/02/18 0906  Resp: 16  Temp: (!) 97.1 F (36.2 C)  Height: 5\' 7"  (1.702 m)   Body mass index is 32.26 kg/m.   Observation/Objective: The patient is alert and oriented. She appears to be mildly ill with nasal congestion audible. No wheezing or cough are appreciated at this time. She does not appear  to be in acute distress.     Assessment/Plan: 1. Acute non-recurrent frontal sinusitis Start z-pack take as directed for 5 days. Rest and increase fluids. Continue OTC medication to help improve acute symptoms.  - azithromycin (ZITHROMAX) 250 MG tablet; z-pack - take as directed for 5 days  Dispense: 6 tablet; Refill: 0  2. Body mass index (bmi) 32.0-32.9, adult May restart phentermine after completion of antibiotics and feeling better. At that ooint, she should continue with limiting calorie intake to 1200 calories per day and incorporate exercise into daily routine.  - phentermine (ADIPEX-P) 37.5 MG tablet; Take 1 tablet (37.5 mg total) by mouth daily before breakfast.  Dispense: 30 tablet; Refill: 1  General Counseling: Rajanae verbalizes understanding of the findings of  today's phone visit and agrees with plan of treatment. I have discussed any further diagnostic evaluation that may be needed or ordered today. We also reviewed her medications today. she has been encouraged to call the office with any questions or concerns that should arise related to todays visit.   There is a liability release in patients' chart. There has been a 10 minute discussion about the side effects including but not limited to elevated blood pressure, anxiety, lack of sleep and dry mouth. Pt understands and will like to start/continue on appetite suppressant at this time. There will be one month RX given at the time of visit with proper follow up. Nova diet plan with restricted calories is given to the pt. Pt understands and agrees with  plan of treatment  This patient was seen by Vincent Gros FNP Collaboration with Dr Lyndon Code as a part of collaborative care agreement  Meds ordered this encounter  Medications  . azithromycin (ZITHROMAX) 250 MG tablet    Sig: z-pack - take as directed for 5 days    Dispense:  6 tablet    Refill:  0    This is for acute sinusitis    Order Specific Question:   Supervising  Provider    Answer:   Lyndon Code [[id number]]  . phentermine (ADIPEX-P) 37.5 MG tablet    Sig: Take 1 tablet (37.5 mg total) by mouth daily before breakfast.    Dispense:  30 tablet    Refill:  1    Order Specific Question:   Supervising Provider    Answer:   Lyndon Code [[id number]]    Time spent: 31 Minutes    Dr Lyndon Code Internal medicine

## 2018-05-15 DIAGNOSIS — Z6832 Body mass index (BMI) 32.0-32.9, adult: Secondary | ICD-10-CM | POA: Insufficient documentation

## 2018-05-26 ENCOUNTER — Encounter: Payer: Self-pay | Admitting: Psychiatry

## 2018-05-26 ENCOUNTER — Telehealth (HOSPITAL_COMMUNITY): Payer: Self-pay | Admitting: Psychiatry

## 2018-05-26 ENCOUNTER — Other Ambulatory Visit: Payer: Self-pay

## 2018-05-26 ENCOUNTER — Ambulatory Visit (INDEPENDENT_AMBULATORY_CARE_PROVIDER_SITE_OTHER): Payer: 59 | Admitting: Psychiatry

## 2018-05-26 DIAGNOSIS — F5105 Insomnia due to other mental disorder: Secondary | ICD-10-CM | POA: Diagnosis not present

## 2018-05-26 DIAGNOSIS — F3181 Bipolar II disorder: Secondary | ICD-10-CM | POA: Diagnosis not present

## 2018-05-26 DIAGNOSIS — Z634 Disappearance and death of family member: Secondary | ICD-10-CM

## 2018-05-26 DIAGNOSIS — F410 Panic disorder [episodic paroxysmal anxiety] without agoraphobia: Secondary | ICD-10-CM | POA: Diagnosis not present

## 2018-05-26 MED ORDER — LAMOTRIGINE 25 MG PO TABS: 25.0000 mg | ORAL_TABLET | Freq: Every day | ORAL | 1 refills | Status: DC

## 2018-05-26 NOTE — Telephone Encounter (Signed)
D:  Dr. Elna Breslow referred pt to [facility]-IOP.  A:  Placed call to orient pt and provide her with a start date, but there was no answer.  Left vm for pt to call writer back.  Inform Dr. Elna Breslow.

## 2018-05-26 NOTE — Progress Notes (Signed)
Virtual Visit via Video Note  I connected with Brittney Duran on 05/26/18 at 10:00 AM EDT by a video enabled telemedicine application and verified that I am speaking with the correct person using two identifiers.   I discussed the limitations of evaluation and management by telemedicine and the availability of in person appointments. The patient expressed understanding and agreed to proceed.    I discussed the assessment and treatment plan with the patient. The patient was provided an opportunity to ask questions and all were answered. The patient agreed with the plan and demonstrated an understanding of the instructions.   The patient was advised to call back or seek an in-person evaluation if the symptoms worsen or if the condition fails to improve as anticipated.   BH MD OP Progress Note  05/26/2018 1:09 PM Brittney Duran  MRN:  [id number]  Chief Complaint:  Chief Complaint    Follow-up     HPI: Brittney Duran is a 48 year old African-American female, married, unemployed, lives in [place], has a history of bipolar disorder, panic disorder, insomnia, history of TIA, was evaluated by telemedicine today.  Patient today reports she continues to have good days and bad days.  There are days when she feels okay and is able to cope and other days when she feels like she is not worth anything.  She also reports some random suicidal thoughts on and off.  However currently denies any active thoughts or intent.  She reports she has been able to distract herself by talking to someone, going for a walk.  Patient reports she is currently taking her Abilify, Wellbutrin and mirtazapine.  She continues to struggle with sleep on and off.  Since she continues to struggle with depressive symptoms as well as sleep discussed adding a mood stabilizer.  Patient agrees with plan.  We will start Lamictal.  Also due to her chronic suicidality and depressive symptoms will refer her again to intensive outpatient  program.  This was discussed several times previously however patient always declines due to problems with transportation and so on.  However currently IOP is being offered virtually due to the COVID-19 outbreak and discussed with her that this is a good time to make use of it.  I have sent a referral to [name] at [facility] for the IOP program and she will reach out to the patient.  Also discussed starting melatonin for sleep problems.  She will continue mirtazapine for now.  Patient advised to continue psychotherapy sessions with [name] in the meantime.  Patient reports several stressors including financial as well as the current COVID-19 outbreak. Visit Diagnosis:    ICD-10-CM   1. Bipolar 2 disorder, major depressive episode (HCC) F31.81 lamoTRIgine (LAMICTAL) 25 MG tablet  2. Panic disorder F41.0   3. Insomnia due to mental condition F51.05   4. Bereavement Z63.4     Past Psychiatric History: Reviewed past psychiatric history from my progress note on 04/18/2017.  Past Medical History:  Past Medical History:  Diagnosis Date  . Anemia   . Anxiety    Panic attacks  . Complication of anesthesia    pt reports "local" wears off quickly  . Depression   . Environmental allergies   . Hemorrhoids   . Leaky heart valve   . Sciatica 09/08/2017  . Wears contact lenses     Past Surgical History:  Procedure Laterality Date  . Caesaran section  1989  . COLONOSCOPY WITH PROPOFOL N/A 10/07/2014   Procedure:  COLONOSCOPY WITH PROPOFOL;  Surgeon: Brittney Miniumarren Wohl, MD;  [facility];  Service: Endoscopy;  Laterality: N/A;  Latex  . HERNIA REPAIR  1978    Family Psychiatric History: Reviewed family psychiatric history from my progress note on 04/18/2017.  Family History:  Family History  Problem Relation Age of Onset  . Hypertension Mother   . Seizures Brother   . Pancreatic cancer Other     Social History: Reviewed social history from my progress note on  04/18/2017. Social History   Socioeconomic History  . Marital status: Married    Spouse name: [name]  . Number of children: 5  . Years of education: 7716  . Highest education level: Some college, no degree  Occupational History  . Occupation: [employer]  Social Needs  . Financial resource strain: Not very hard  . Food insecurity:    Worry: Sometimes true    Inability: Sometimes true  . Transportation needs:    Medical: No    Non-medical: No  Tobacco Use  . Smoking status: Former Smoker    Years: 15.00  . Smokeless tobacco: Never Used  Substance and Sexual Activity  . Alcohol use: Not Currently    Alcohol/week: 0.0 standard drinks    Comment: "socially"  . Drug use: No  . Sexual activity: Yes    Birth control/protection: I.U.D.  Lifestyle  . Physical activity:    Days per week: 0 days    Minutes per session: 0 min  . Stress: Rather much  Relationships  . Social connections:    Talks on phone: Not on file    Gets together: Not on file    Attends religious service: More than 4 times per year    Active member of club or organization: Yes    Attends meetings of clubs or organizations: More than 4 times per year    Relationship status: Married  Other Topics Concern  . Not on file  Social History Narrative   Lives at home w/ her husband and children   Left-handed   Drinks 1 cup of coffee per day    Allergies:  Allergies  Allergen Reactions  . Lithium     Face swelling   . Penicillins Other (See Comments)    Unknown reaction Has patient had a PCN reaction causing immediate rash, facial/tongue/throat swelling, SOB or lightheadedness with hypotension: YES Has patient had a PCN reaction causing severe rash involving mucus membranes or skin necrosis: NO Has patient had a PCN reaction that required hospitalizationNO Has patient had a PCN reaction occurring within the last 10 years: NO If all of the above answers are "NO", then may proceed with Cephalosporin use.  .  Shellfish Allergy Swelling    lips  . Latex Rash    Metabolic Disorder Labs: Lab Results  Component Value Date   HGBA1C 5.4 05/09/2015   MPG 108 05/09/2015   No results found for: PROLACTIN Lab Results  Component Value Date   CHOL 128 05/09/2015   TRIG 79 05/09/2015   HDL 41 05/09/2015   CHOLHDL 3.1 05/09/2015   VLDL 16 05/09/2015   LDLCALC 71 05/09/2015   Lab Results  Component Value Date   TSH 2.093 08/26/2017    Therapeutic Level Labs: No results found for: LITHIUM No results found for: VALPROATE No components found for:  CBMZ  Current Medications: Current Outpatient Medications  Medication Sig Dispense Refill  . ARIPiprazole (ABILIFY) 20 MG tablet Take 1 tablet (20 mg total) by mouth daily.  90 tablet 0  . aspirin 81 MG tablet Take 162 mg by mouth daily.    Marland Kitchen azithromycin (ZITHROMAX) 250 MG tablet z-pack - take as directed for 5 days 6 tablet 0  . buPROPion (WELLBUTRIN XL) 150 MG 24 hr tablet TAKE 1 TABLET BY MOUTH EVERY DAY WITH BREAKFAST 90 tablet 1  . cetirizine-pseudoephedrine (ZYRTEC-D) 5-120 MG per tablet Take 1 tablet by mouth 2 (two) times daily as needed for allergies.    . Cholecalciferol (VITAMIN D3) 5000 units CAPS Take 1 capsule by mouth daily.    . hydrOXYzine (VISTARIL) 25 MG capsule TAKE 1 CAPSULE BY MOUTH EVERY 6 HOURS PAN/INSOMNIA 360 capsule 1  . ibuprofen (ADVIL,MOTRIN) 800 MG tablet Take 1 tablet (800 mg total) by mouth every 8 (eight) hours as needed for moderate pain. 15 tablet 0  . lamoTRIgine (LAMICTAL) 25 MG tablet Take 1 tablet (25 mg total) by mouth daily. For mood 30 tablet 1  . mirtazapine (REMERON) 15 MG tablet Take 1 tablet (15 mg total) by mouth at bedtime. 90 tablet 0  . PARAGARD INTRAUTERINE COPPER IUD IUD 1 each by Intrauterine route once.    . phentermine (ADIPEX-P) 37.5 MG tablet Take 1 tablet (37.5 mg total) by mouth daily before breakfast. 30 tablet 1  . vitamin B-12 (CYANOCOBALAMIN) 1000 MCG tablet Take 1,000 mcg by mouth daily  as needed (energy). Reported on 05/08/2015     No current facility-administered medications for this visit.      Musculoskeletal: Strength & Muscle Tone: UTA Gait & Station: UTA Patient leans: N/A  Psychiatric Specialty Exam: Review of Systems  Psychiatric/Behavioral: Positive for depression. The patient is nervous/anxious and has insomnia.   All other systems reviewed and are negative.   There were no vitals taken for this visit.There is no height or weight on file to calculate BMI.  General Appearance: Casual  Eye Contact:  Fair  Speech:  Clear and Coherent  Volume:  Normal  Mood:  Anxious and Depressed  Affect:  Congruent  Thought Process:  Goal Directed and Descriptions of Associations: Intact  Orientation:  Full (Time, Place, and Person)  Thought Content: Logical   Suicidal Thoughts:  No  does struggle with chronic suicidality.  Homicidal Thoughts:  No  Memory:  Immediate;   Fair Recent;   Fair Remote;   Fair  Judgement:  Fair  Insight:  Fair  Psychomotor Activity:  Normal  Concentration:  Concentration: Fair and Attention Span: Fair  Recall:  [employer] of Knowledge: Fair  Language: Fair  Akathisia:  No  Handed:  Right  AIMS (if indicated): Denies tremors, rigidity,stiffness  Assets:  Communication Skills Desire for Improvement Social Support  ADL's:  Intact  Cognition: WNL  Sleep:  Poor   Screenings: PHQ2-9     Office Visit from 03/24/2018 in [facility], [facility] Office Visit from 09/09/2017 in [facility], [facility] Office Visit from 08/26/2017 in [facility], [facility]  PHQ-2 Total Score  0  4  6  PHQ-9 Total Score  -  15  21       Assessment and Plan: Sophonie is a 48 year old African-American female has a history of bipolar disorder, panic attacks, insomnia, history of TIA, history of trauma was evaluated by telemedicine today.  Patient continues to struggle with depressive symptoms as well as sleep problems.  Patient with multiple  psychosocial stressors like financial problems, coronavirus outbreak and so on.  Patient will continue to benefit from medication changes as well  as possible referral to IOP.  Plan as discussed below.  Plan Bipolar disorder-unstable Abilify 20 mg p.o. daily Start Lamictal 25 mg p.o. daily Wellbutrin XL 150 mg p.o. daily Mirtazapine 15 mg p.o. nightly  Panic disorder- improving Continue CBT. Hydroxyzine as needed  For insomnia- some improvement Mirtazapine 15 mg p.o. nightly Discussed with patient to add melatonin as needed.  Follow-up in clinic in 2 to 3 weeks or sooner if needed.  Appointment scheduled for May 29 at 10:45 AM  I have referred patient to Ms. [place] S[place] at [facility] who will contact her.  Patient will benefit from more intensive psychotherapy sessions/IOP since she continues to struggle with mood and sleep.  I have spent atleast 25 minutes non face to face with patient today. More than 50 % of the time was spent for psychoeducation and supportive psychotherapy and care coordination.  This note was generated in part or whole with voice recognition software. Voice recognition is usually quite accurate but there are transcription errors that can and very often do occur. I apologize for any typographical errors that were not detected and corrected.          Jomarie Longs, MD 05/26/2018, 1:09 PM

## 2018-05-29 NOTE — Progress Notes (Signed)
   THERAPIST PROGRESS NOTE  Session Time: 66mn  Participation Level: Active  Behavioral Response: CasualAlertAnxious  Type of Therapy: Individual Therapy  Treatment Goals addressed: Anxiety  Interventions: CBT and Motivational Interviewing  Summary: Brittney PECKHAMis a 48y.o. female who presents with continued symptoms of diagnosis.  Therapist met with Patient in an outpatient setting to assess current mood and assist with making progress towards goals through the use of therapeutic intervention. Patient reports "see saw" mood.  Therapist discussed and explored stressors.    Suicidal/Homicidal: No  Plan: Return again in 2 weeks.  Diagnosis: Axis I: Bipolar 2    Axis II: No diagnosis    NLubertha South LCSW 04/06/2018

## 2018-06-06 ENCOUNTER — Telehealth (HOSPITAL_COMMUNITY): Payer: Self-pay | Admitting: Psychiatry

## 2018-06-09 ENCOUNTER — Ambulatory Visit: Payer: 59 | Admitting: Nurse Practitioner

## 2018-06-14 ENCOUNTER — Other Ambulatory Visit: Payer: Self-pay | Admitting: Psychiatry

## 2018-06-14 DIAGNOSIS — F410 Panic disorder [episodic paroxysmal anxiety] without agoraphobia: Secondary | ICD-10-CM

## 2018-06-14 DIAGNOSIS — F3181 Bipolar II disorder: Secondary | ICD-10-CM

## 2018-06-16 ENCOUNTER — Ambulatory Visit (INDEPENDENT_AMBULATORY_CARE_PROVIDER_SITE_OTHER): Payer: 59 | Admitting: Psychiatry

## 2018-06-16 ENCOUNTER — Encounter: Payer: Self-pay | Admitting: Psychiatry

## 2018-06-16 ENCOUNTER — Other Ambulatory Visit: Payer: Self-pay

## 2018-06-16 DIAGNOSIS — F5105 Insomnia due to other mental disorder: Secondary | ICD-10-CM

## 2018-06-16 DIAGNOSIS — Z634 Disappearance and death of family member: Secondary | ICD-10-CM

## 2018-06-16 DIAGNOSIS — F3181 Bipolar II disorder: Secondary | ICD-10-CM | POA: Diagnosis not present

## 2018-06-16 DIAGNOSIS — F410 Panic disorder [episodic paroxysmal anxiety] without agoraphobia: Secondary | ICD-10-CM | POA: Diagnosis not present

## 2018-06-16 MED ORDER — BUPROPION HCL ER (XL) 150 MG PO TB24: ORAL_TABLET | ORAL | 1 refills | Status: DC

## 2018-06-16 NOTE — Progress Notes (Signed)
Virtual Visit via Video Note  I connected with Brittney Duran on 06/16/18 at 11:45 AM EDT by a video enabled telemedicine application and verified that I am speaking with the correct person using two identifiers.   I discussed the limitations of evaluation and management by telemedicine and the availability of in person appointments. The patient expressed understanding and agreed to proceed.   I discussed the assessment and treatment plan with the patient. The patient was provided an opportunity to ask questions and all were answered. The patient agreed with the plan and demonstrated an understanding of the instructions.   The patient was advised to call back or seek an in-person evaluation if the symptoms worsen or if the condition fails to improve as anticipated.  BH MD OP Progress Note  06/16/2018 12:22 PM Brittney Duran  MRN:  [id number]  Chief Complaint:  Chief Complaint    Follow-up     HPI: Brittney Duran is a 48 year old African-American female, married, unemployed, lives in [place], has a history of bipolar disorder, panic disorder, insomnia, history of TIA was evaluated by telemedicine today.  Patient today reports she recently had a lot of psychosocial stressors.  She reports one of her cousins got shot and killed in a home invasion. She reports it was kind of hard for her to cope with it.  She reports she also has been watching the news and that also makes her distressed about all the racial problems that is on the news right now.  She however reports her mother has been coming to visit her more often and that has helped her a lot.  When her mom is home she can just relax since she takes care of a lot of things around the house.  She reports she started taking the Lamictal.  She reports the Lamictal may be helping.  She wants to stay on this dosage and does not want dosage readjustment today.  She does report mild nausea on and off.  She also reports one episode when she felt like she  had hallucinations, she felt she could smell her grandmother's cooking in the kitchen.  It happened only once and never came back.  Patient reports she is compliant on her other medications.  She denies any side effects to them.  Patient denies any current suicidality or active plans.  She does have a history of chronic suicidal thoughts on and off however currently denies it.  Patient was referred to IOP program recently however she reports she is waiting to call her health insurance plan back to see how much it will cost her.  She has not done it yet.  Patient also has not had any appointments with [name] recently.  Encouraged her to call her health insurance plan and also encourage her to make appointment with [name].  She agreed to both.  Patient denies any other concerns today. Visit Diagnosis:    ICD-10-CM   1. Bipolar 2 disorder, major depressive episode (HCC) F31.81 buPROPion (WELLBUTRIN XL) 150 MG 24 hr tablet  2. Panic disorder F41.0   3. Insomnia due to mental condition F51.05   4. Bereavement Z63.4     Past Psychiatric History: Reviewed past psychiatric history from my progress note on 04/18/2017.  Past Medical History:  Past Medical History:  Diagnosis Date  . Anemia   . Anxiety    Panic attacks  . Complication of anesthesia    pt reports "local" wears off quickly  . Depression   .  Environmental allergies   . Hemorrhoids   . Leaky heart valve   . Sciatica 09/08/2017  . Wears contact lenses     Past Surgical History:  Procedure Laterality Date  . Caesaran section  1989  . COLONOSCOPY WITH PROPOFOL N/A 10/07/2014   Procedure: COLONOSCOPY WITH PROPOFOL;  Surgeon: [name], MD;  Location: [facility];  Service: Endoscopy;  Laterality: N/A;  Latex  . HERNIA REPAIR  1978    Family Psychiatric History: Reviewed family psychiatric history from my progress note on 04/18/2017.  Family History:  Family History  Problem Relation Age of Onset  .  Hypertension Mother   . Seizures Brother   . Pancreatic cancer Other     Social History: Reviewed social history from my progress note on 04/18/2017. Social History   Socioeconomic History  . Marital status: Married    Spouse name: Brittney Duran  . Number of children: 5  . Years of education: 33  . Highest education level: Some college, no degree  Occupational History  . Occupation: [employer]  Social Needs  . Financial resource strain: Not very hard  . Food insecurity:    Worry: Sometimes true    Inability: Sometimes true  . Transportation needs:    Medical: No    Non-medical: No  Tobacco Use  . Smoking status: Former Smoker    Years: 15.00  . Smokeless tobacco: Never Used  Substance and Sexual Activity  . Alcohol use: Not Currently    Alcohol/week: 0.0 standard drinks    Comment: "socially"  . Drug use: No  . Sexual activity: Yes    Birth control/protection: I.U.D.  Lifestyle  . Physical activity:    Days per week: 0 days    Minutes per session: 0 min  . Stress: Rather much  Relationships  . Social connections:    Talks on phone: Not on file    Gets together: Not on file    Attends religious service: More than 4 times per year    Active member of club or organization: Yes    Attends meetings of clubs or organizations: More than 4 times per year    Relationship status: Married  Other Topics Concern  . Not on file  Social History Narrative   Lives at home w/ her husband and children   Left-handed   Drinks 1 cup of coffee per day    Allergies:  Allergies  Allergen Reactions  . Lithium     Face swelling   . Penicillins Other (See Comments)    Unknown reaction Has patient had a PCN reaction causing immediate rash, facial/tongue/throat swelling, SOB or lightheadedness with hypotension: YES Has patient had a PCN reaction causing severe rash involving mucus membranes or skin necrosis: NO Has patient had a PCN reaction that required hospitalizationNO Has patient  had a PCN reaction occurring within the last 10 years: NO If all of the above answers are "NO", then may proceed with Cephalosporin use.  . Shellfish Allergy Swelling    lips  . Latex Rash    Metabolic Disorder Labs: Lab Results  Component Value Date   HGBA1C 5.4 05/09/2015   MPG 108 05/09/2015   No results found for: PROLACTIN Lab Results  Component Value Date   CHOL 128 05/09/2015   TRIG 79 05/09/2015   HDL 41 05/09/2015   CHOLHDL 3.1 05/09/2015   VLDL 16 05/09/2015   LDLCALC 71 05/09/2015   Lab Results  Component Value Date   TSH 2.093 08/26/2017  Therapeutic Level Labs: No results found for: LITHIUM No results found for: VALPROATE No components found for:  CBMZ  Current Medications: Current Outpatient Medications  Medication Sig Dispense Refill  . ARIPiprazole (ABILIFY) 20 MG tablet TAKE 1 TABLET BY MOUTH EVERY DAY 90 tablet 0  . aspirin 81 MG tablet Take 162 mg by mouth daily.    . azithromycin (ZITHROMAX) 250 MG tablet z-pack - take as directed for 5 days 6 tablet 0  . buPROPion (WELLBUTRIN XL) 150 MG 24 hr tablet TAKE 1 TABLET BY MOUTH EVERY Marland KitchenAY WITH BREAKFAST 90 tablet 1  . cetirizine-pseudoephedrine (ZYRTEC-D) 5-120 MG per tablet Take 1 tablet by mouth 2 (two) times daily as needed for allergies.    . Cholecalciferol (VITAMIN D3) 5000 units CAPS Take 1 capsule by mouth daily.    . hydrOXYzine (VISTARIL) 25 MG capsule TAKE 1 CAPSULE BY MOUTH EVERY 6 HOURS PAN/INSOMNIA 360 capsule 1  . ibuprofen (ADVIL,MOTRIN) 800 MG tablet Take 1 tablet (800 mg total) by mouth every 8 (eight) hours as needed for moderate pain. 15 tablet 0  . lamoTRIgine (LAMICTAL) 25 MG tablet Take 1 tablet (25 mg total) by mouth daily. For mood 30 tablet 1  . mirtazapine (REMERON) 15 MG tablet Take 1 tablet (15 mg total) by mouth at bedtime. 90 tablet 0  . PARAGARD INTRAUTERINE COPPER IUD IUD 1 each by Intrauterine route once.    . phentermine (ADIPEX-P) 37.5 MG tablet Take 1 tablet (37.5 mg  total) by mouth daily before breakfast. 30 tablet 1  . vitamin B-12 (CYANOCOBALAMIN) 1000 MCG tablet Take 1,000 mcg by mouth daily as needed (energy). Reported on 05/08/2015     No current facility-administered medications for this visit.      Musculoskeletal: Strength & Muscle Tone: within normal limits Gait & Station: normal Patient leans: N/A  Psychiatric Specialty Exam: Review of Systems  Psychiatric/Behavioral: Positive for depression.  All other systems reviewed and are negative.   There were no vitals taken for this visit.There is no height or weight on file to calculate BMI.  General Appearance: Casual  Eye Contact:  Fair  Speech:  Clear and Coherent  Volume:  Normal  Mood:  Depressed improving  Affect:  Congruent  Thought Process:  Goal Directed and Descriptions of Associations: Intact  Orientation:  Full (Time, Place, and Person)  Thought Content: Logical   Suicidal Thoughts:  No  Homicidal Thoughts:  No  Memory:  Immediate;   Fair Recent;   Fair Remote;   Fair  Judgement:  Fair  Insight:  Fair  Psychomotor Activity:  Normal  Concentration:  Concentration: Fair and Attention Span: Fair  Recall:  [employer]Fair  Fund of Knowledge: Fair  Language: Fair  Akathisia:  No  Handed:  Right  AIMS (if indicated): denies tremors, rigidity,stiffness  Assets:  Communication Skills Desire for Improvement Social Support  ADL's:  Intact  Cognition: WNL  Sleep:  Fair   Screenings: PHQ2-9     [facility]  PHQ-2 Total Score  0  4  6  PHQ-9 Total Score  -  15  21       Assessment and Plan: Robie RidgeKiawana is a 48 year old African-American female, has a history of bipolar disorder, panic attacks, insomnia, history of TIA, history of trauma was evaluated by telemedicine today.  Patient continues to struggle with depressive  symptoms.  She does have several psychosocial stressors including financial problems, recent death in the family, coronavirus outbreak and her own health issues.  Patient was referred for IOP however has not started it yet.  Patient also has not been very compliant with her therapy visits.  Patient will continue to benefit from medication management and psychotherapy sessions.  Plan as noted below.  Plan Bipolar disorder- some improvement Abilify 20 mg p.o. daily Lamictal 25 mg p.o. daily Wellbutrin XL 150 mg p.o. daily Mirtazapine 15 mg p.o. nightly.  For panic disorder-improving Continue CBT Hydroxyzine as needed  For insomnia-improving Mirtazapine 15 mg p.o. nightly Melatonin as needed  Patient was referred for IOP, patient reports she did get a call from Ms. [place] Sink however is waiting to call her health insurance plan to discuss co-pay.  Patient has not done it yet.  She however agrees to call them today.  She will also continue to follow-up with [name], discussed to call to make an appointment with [name].  Follow-up in clinic in 1 month or sooner if needed.  July 2 at 10 AM.  I have spent atleast 15 minutes non face to face with patient today. More than 50 % of the time was spent for psychoeducation and supportive psychotherapy and care coordination.  This note was generated in part or whole with voice recognition software. Voice recognition is usually quite accurate but there are transcription errors that can and very often do occur. I apologize for any typographical errors that were not detected and corrected.        Jomarie Longs, MD 06/16/2018, 12:22 PM

## 2018-06-19 ENCOUNTER — Other Ambulatory Visit: Payer: Self-pay | Admitting: Psychiatry

## 2018-06-19 DIAGNOSIS — F3181 Bipolar II disorder: Secondary | ICD-10-CM

## 2018-06-19 DIAGNOSIS — F410 Panic disorder [episodic paroxysmal anxiety] without agoraphobia: Secondary | ICD-10-CM

## 2018-06-20 ENCOUNTER — Other Ambulatory Visit: Payer: Self-pay | Admitting: Psychiatry

## 2018-06-20 DIAGNOSIS — F3181 Bipolar II disorder: Secondary | ICD-10-CM

## 2018-06-26 ENCOUNTER — Telehealth (HOSPITAL_COMMUNITY): Payer: Self-pay | Admitting: Psychiatry

## 2018-07-04 ENCOUNTER — Ambulatory Visit: Payer: 59 | Admitting: Licensed Clinical Social Worker

## 2018-07-20 ENCOUNTER — Other Ambulatory Visit: Payer: Self-pay

## 2018-07-20 ENCOUNTER — Encounter: Payer: Self-pay | Admitting: Psychiatry

## 2018-07-20 ENCOUNTER — Ambulatory Visit (INDEPENDENT_AMBULATORY_CARE_PROVIDER_SITE_OTHER): Payer: 59 | Admitting: Psychiatry

## 2018-07-20 ENCOUNTER — Ambulatory Visit (INDEPENDENT_AMBULATORY_CARE_PROVIDER_SITE_OTHER): Payer: 59 | Admitting: Licensed Clinical Social Worker

## 2018-07-20 DIAGNOSIS — G4701 Insomnia due to medical condition: Secondary | ICD-10-CM | POA: Insufficient documentation

## 2018-07-20 DIAGNOSIS — F5105 Insomnia due to other mental disorder: Secondary | ICD-10-CM | POA: Insufficient documentation

## 2018-07-20 DIAGNOSIS — F3181 Bipolar II disorder: Secondary | ICD-10-CM

## 2018-07-20 DIAGNOSIS — Z634 Disappearance and death of family member: Secondary | ICD-10-CM

## 2018-07-20 DIAGNOSIS — F410 Panic disorder [episodic paroxysmal anxiety] without agoraphobia: Secondary | ICD-10-CM

## 2018-07-20 MED ORDER — MIRTAZAPINE 15 MG PO TABS: 15.0000 mg | ORAL_TABLET | Freq: Every day | ORAL | 0 refills | Status: DC

## 2018-07-20 NOTE — Progress Notes (Signed)
Virtual Visit via Video Note  I connected with Brittney Duran on 07/20/18 at 10:00 AM EDT by a video enabled telemedicine application and verified that I am speaking with the correct person using two identifiers.   I discussed the limitations of evaluation and management by telemedicine and the availability of in person appointments. The patient expressed understanding and agreed to proceed.    I discussed the assessment and treatment plan with the patient. The patient was provided an opportunity to ask questions and all were answered. The patient agreed with the plan and demonstrated an understanding of the instructions.   The patient was advised to call back or seek an in-person evaluation if the symptoms worsen or if the condition fails to improve as anticipated.  BH MD OP Progress Note  07/20/2018 5:58 PM Brittney Duran  MRN:  [id number]  Chief Complaint:  Chief Complaint    Follow-up     HPI: Brittney Duran is a 48 year old African-American female, married, unemployed, lives in [place], has a history of bipolar disorder type II, panic disorder, insomnia, bereavement, history of TIA was evaluated by telemedicine today.  Patient today reports she continues to struggle with depressive symptoms.  She reports she has a lot of psychosocial stressors.  Her daughter who is 47 years old has to wear a cast for the next 4 weeks.  She also may have to undergo surgery.  She reports it is correctable however her daughter being so young does not tolerate the cast so well.  She reports she cries a lot and it is taking a toll on her.  Patient reports her husband is currently home since he is taking only less hours at work.  That helps her a lot.  She also has her mother and a friend who checks in on her.  Patient however reports she has to push through her days every day since it is very hard for her.  She reports she also struggles with suicidal thoughts which are chronic for her.  She however denies any  active plan now.  She agrees to talk to her family if she has any active thoughts.  She agrees to go to the nearest emergency department.  Patient is compliant on her medications.  She would like her Wellbutrin to be increased.  Discussed with her that she will have to watch out for side effects like increased rage, irritability or anger issues.  Discussed with patient to increase the Wellbutrin to 300 mg and to call writer back in a week from now if she is okay with the dosage.  Also discussed with her that if that does not work her Lamictal can be increased.  She agrees with plan.  Patient currently denies any side effects to medications.  Patient did not follow-up with [name] since our last visit.  Patient was advised several times to make frequent therapy sessions.  Patient however has appointment with [name] today.  [facility] however patient reports she cannot pay high co-pay for intensive outpatient programs.  Discussed with patient that I will coordinate care with [name] regarding what else can be done for her and whether she can be referred to other community resources which are more affordable for her given her worsening symptoms.  She agrees with plan. Visit Diagnosis:    ICD-10-CM   1. Bipolar 2 disorder, major depressive episode (HCC)  F31.81   2. Panic disorder  F41.0 mirtazapine (REMERON)  15 MG tablet  3. Insomnia due to mental condition  F51.05 mirtazapine (REMERON) 15 MG tablet  4. Bereavement  Z63.4     Past Psychiatric History: Reviewed past psychiatric history from my progress note on 04/18/2017.  Past Medical History:  Past Medical History:  Diagnosis Date  . Anemia   . Anxiety    Panic attacks  . Complication of anesthesia    pt reports "local" wears off quickly  . Depression   . Environmental allergies   . Hemorrhoids   . Leaky heart valve   . Sciatica 09/08/2017  . Wears contact lenses     Past Surgical  History:  Procedure Laterality Date  . Caesaran section  1989  . COLONOSCOPY WITH PROPOFOL N/A 10/07/2014   Procedure: COLONOSCOPY WITH PROPOFOL;  Surgeon: Lucilla Lame, MD;  Location: [facility];  Service: Endoscopy;  Laterality: N/A;  Latex  . HERNIA REPAIR  1978    Family Psychiatric History: Reviewed family psychiatric history from my progress note on 04/18/2017.  Family History:  Family History  Problem Relation Age of Onset  . Hypertension Mother   . Seizures Brother   . Pancreatic cancer Other     Social History: Reviewed social history from my progress note on 04/18/2017. Social History   Socioeconomic History  . Marital status: Married    Spouse name: duglaus Gallogly  . Number of children: 5  . Years of education: 95  . Highest education level: Some college, no degree  Occupational History  . Occupation: [employer]  Social Needs  . Financial resource strain: Not very hard  . Food insecurity    Worry: Sometimes true    Inability: Sometimes true  . Transportation needs    Medical: No    Non-medical: No  Tobacco Use  . Smoking status: Former Smoker    Years: 15.00  . Smokeless tobacco: Never Used  Substance and Sexual Activity  . Alcohol use: Not Currently    Alcohol/week: 0.0 standard drinks    Comment: "socially"  . Drug use: No  . Sexual activity: Yes    Birth control/protection: I.U.D.  Lifestyle  . Physical activity    Days per week: 0 days    Minutes per session: 0 min  . Stress: Rather much  Relationships  . Social Herbalist on phone: Not on file    Gets together: Not on file    Attends religious service: More than 4 times per year    Active member of club or organization: Yes    Attends meetings of clubs or organizations: More than 4 times per year    Relationship status: Married  Other Topics Concern  . Not on file  Social History Narrative   Lives at home w/ her husband and children   Left-handed   Drinks 1 cup of coffee per  day    Allergies:  Allergies  Allergen Reactions  . Lithium     Face swelling   . Penicillins Other (See Comments)    Unknown reaction Has patient had a PCN reaction causing immediate rash, facial/tongue/throat swelling, SOB or lightheadedness with hypotension: YES Has patient had a PCN reaction causing severe rash involving mucus membranes or skin necrosis: NO Has patient had a PCN reaction that required hospitalizationNO Has patient had a PCN reaction occurring within the last 10 years: NO If all of the above answers are "NO", then may proceed with Cephalosporin use.  . Shellfish Allergy Swelling    lips  .  Latex Rash    Metabolic Disorder Labs: Lab Results  Component Value Date   HGBA1C 5.4 05/09/2015   MPG 108 05/09/2015   No results found for: PROLACTIN Lab Results  Component Value Date   CHOL 128 05/09/2015   TRIG 79 05/09/2015   HDL 41 05/09/2015   CHOLHDL 3.1 05/09/2015   VLDL 16 05/09/2015   LDLCALC 71 05/09/2015   Lab Results  Component Value Date   TSH 2.093 08/26/2017    Therapeutic Level Labs: No results found for: LITHIUM No results found for: VALPROATE No components found for:  CBMZ  Current Medications: Current Outpatient Medications  Medication Sig Dispense Refill  . ARIPiprazole (ABILIFY) 20 MG tablet TAKE 1 TABLET BY MOUTH EVERY DAY 90 tablet 0  . aspirin 81 MG tablet Take 162 mg by mouth daily.    Marland Kitchen. azithromycin (ZITHROMAX) 250 MG tablet z-pack - take as directed for 5 days 6 tablet 0  . buPROPion (WELLBUTRIN XL) 150 MG 24 hr tablet TAKE 1 TABLET BY MOUTH EVERY DAY WITH BREAKFAST 90 tablet 1  . cetirizine-pseudoephedrine (ZYRTEC-D) 5-120 MG per tablet Take 1 tablet by mouth 2 (two) times daily as needed for allergies.    . Cholecalciferol (VITAMIN D3) 5000 units CAPS Take 1 capsule by mouth daily.    . hydrOXYzine (VISTARIL) 25 MG capsule TAKE 1 CAPSULE BY MOUTH EVERY 6 HOURS PAN/INSOMNIA 360 capsule 1  . ibuprofen (ADVIL,MOTRIN) 800 MG  tablet Take 1 tablet (800 mg total) by mouth every 8 (eight) hours as needed for moderate pain. 15 tablet 0  . lamoTRIgine (LAMICTAL) 25 MG tablet TAKE 1 TABLET (25 MG TOTAL) BY MOUTH DAILY. FOR MOOD 90 tablet 1  . mirtazapine (REMERON) 15 MG tablet Take 1 tablet (15 mg total) by mouth at bedtime. 90 tablet 0  . PARAGARD INTRAUTERINE COPPER IUD IUD 1 each by Intrauterine route once.    . phentermine (ADIPEX-P) 37.5 MG tablet Take 1 tablet (37.5 mg total) by mouth daily before breakfast. 30 tablet 1  . vitamin B-12 (CYANOCOBALAMIN) 1000 MCG tablet Take 1,000 mcg by mouth daily as needed (energy). Reported on 05/08/2015     No current facility-administered medications for this visit.      Musculoskeletal: Strength & Muscle Tone: within normal limits Gait & Station: normal Patient leans: N/A  Psychiatric Specialty Exam: Review of Systems  Psychiatric/Behavioral: Positive for depression. The patient is nervous/anxious.   All other systems reviewed and are negative.   There were no vitals taken for this visit.There is no height or weight on file to calculate BMI.  General Appearance: Casual  Eye Contact:  Fair  Speech:  Clear and Coherent  Volume:  Normal  Mood:  Anxious and Depressed  Affect:  Appropriate  Thought Process:  Goal Directed and Descriptions of Associations: Intact  Orientation:  Full (Time, Place, and Person)  Thought Content: Logical   Suicidal Thoughts:  No  Homicidal Thoughts:  No  Memory:  Immediate;   Fair Recent;   Fair Remote;   Fair  Judgement:  Fair  Insight:  Fair  Psychomotor Activity:  Normal  Concentration:  Concentration: Fair and Attention Span: Fair  Recall:  FiservFair  Fund of Knowledge: Good  Language: Fair  Akathisia:  No  Handed:  Right  AIMS (if indicated): denies tremors, rigidity  Assets:  Communication Skills Desire for Improvement Social Support  ADL's:  Intact  Cognition: WNL  Sleep:  Fair   Screenings: PHQ2-9     Office  [facility]  PHQ-2 Total Score  0  4  6  PHQ-9 Total Score  -  15  21       Assessment and Plan: Brittney Duran is a 48 year old African-American female who has a history of bipolar disorder, panic attacks, insomnia, history of TIA, history of trauma was evaluated by telemedicine today.  She continues to struggle with multiple psychosocial stressors including daughter's health issues, coronavirus outbreak, her own health problems, financial stressors.  Patient continues to struggle with depressive symptoms however making some progress on the medications.  She does have good social support system.  Discussed plan as noted below.  Plan Bipolar disorder- some progress Abilify 20 mg p.o. daily Lamictal 25 mg p.o. daily Increase Wellbutrin XL to 300 mg p.o. daily.  She has supplies at home now.  She will call writer back in a week to Pharmacist, hospitallet writer know if she is tolerating this dosage okay. Mirtazapine 15 mg p.o. nightly  For panic disorder-improving Continue CBT with [name] Hydroxyzine as needed  For insomnia-improving Mirtazapine 15 mg p.o. nightly Melatonin as needed.   Patient with severe depressive symptoms, has a lot of psychosocial stressors is on multiple medications and has failed trials of other medications before seems to be treatment resistant.  Patient was referred for intensive outpatient program as well as other intensive program several times in the past however she never follows through with recommendations.  Patient was advised to make more frequent therapy sessions with [name] however she also does not follow through with those recommendations.  Patient with chronic suicidality however denies any active suicidal plans or family history of suicide attempts or attempts in herself.  Patient has good social support system and  currently has her husband at home who is supportive and also has good support from her mother.  Acute risk for suicide is low however chronic risk is moderate to high.  Patient will benefit from more intensive treatment and hence I have coordinated care with [name] and have advised patient to talk to therapist today.  We will continue to monitor closely.  Crisis plan discussed with patient.  I have spent atleast 25 minutes non face to face with patient today. More than 50 % of the time was spent for psychoeducation and supportive psychotherapy and care coordination.  This note was generated in part or whole with voice recognition software. Voice recognition is usually quite accurate but there are transcription errors that can and very often do occur. I apologize for any typographical errors that were not detected and corrected.       Jomarie LongsSaramma Piya Mesch, MD 07/20/2018, 5:58 PM

## 2018-07-26 ENCOUNTER — Other Ambulatory Visit: Payer: Self-pay | Admitting: Adult Health

## 2018-07-26 ENCOUNTER — Other Ambulatory Visit: Payer: Self-pay | Admitting: Psychiatry

## 2018-07-26 DIAGNOSIS — F5105 Insomnia due to other mental disorder: Secondary | ICD-10-CM

## 2018-07-26 DIAGNOSIS — J0110 Acute frontal sinusitis, unspecified: Secondary | ICD-10-CM

## 2018-08-03 ENCOUNTER — Ambulatory Visit (INDEPENDENT_AMBULATORY_CARE_PROVIDER_SITE_OTHER): Payer: 59 | Admitting: Licensed Clinical Social Worker

## 2018-08-03 ENCOUNTER — Other Ambulatory Visit: Payer: Self-pay

## 2018-08-03 DIAGNOSIS — F3181 Bipolar II disorder: Secondary | ICD-10-CM

## 2018-08-28 ENCOUNTER — Ambulatory Visit: Payer: 59 | Admitting: Licensed Clinical Social Worker

## 2018-08-28 ENCOUNTER — Other Ambulatory Visit: Payer: Self-pay

## 2018-08-28 ENCOUNTER — Ambulatory Visit (INDEPENDENT_AMBULATORY_CARE_PROVIDER_SITE_OTHER): Payer: 59 | Admitting: Psychiatry

## 2018-08-28 DIAGNOSIS — Z5329 Procedure and treatment not carried out because of patient's decision for other reasons: Secondary | ICD-10-CM

## 2018-08-28 NOTE — Progress Notes (Signed)
Sent [employer] invite. No response.

## 2018-08-31 ENCOUNTER — Other Ambulatory Visit: Payer: Self-pay

## 2018-08-31 ENCOUNTER — Ambulatory Visit: Payer: 59 | Admitting: Licensed Clinical Social Worker

## 2018-10-03 ENCOUNTER — Ambulatory Visit: Payer: Self-pay | Admitting: Obstetrics and Gynecology

## 2018-10-06 ENCOUNTER — Other Ambulatory Visit: Payer: Self-pay

## 2018-10-06 ENCOUNTER — Encounter: Payer: Self-pay | Admitting: Obstetrics and Gynecology

## 2018-10-06 ENCOUNTER — Ambulatory Visit (INDEPENDENT_AMBULATORY_CARE_PROVIDER_SITE_OTHER): Payer: 59 | Admitting: Obstetrics and Gynecology

## 2018-10-06 VITALS — BP 120/80 | Ht 67.0 in | Wt 222.0 lb

## 2018-10-06 DIAGNOSIS — N898 Other specified noninflammatory disorders of vagina: Secondary | ICD-10-CM | POA: Diagnosis not present

## 2018-10-06 DIAGNOSIS — R300 Dysuria: Secondary | ICD-10-CM

## 2018-10-06 LAB — POCT URINALYSIS DIPSTICK
Bilirubin, UA: NEGATIVE
Blood, UA: NEGATIVE
Glucose, UA: NEGATIVE
Ketones, UA: NEGATIVE
Nitrite, UA: NEGATIVE
Protein, UA: NEGATIVE
Spec Grav, UA: 1.015 (ref 1.010–1.025)
pH, UA: 7 (ref 5.0–8.0)

## 2018-10-06 LAB — POCT WET PREP WITH KOH
KOH Prep POC: NEGATIVE
Trichomonas, UA: NEGATIVE
Yeast Wet Prep HPF POC: NEGATIVE

## 2018-10-06 MED ORDER — METRONIDAZOLE 500 MG PO TABS: 500.0000 mg | ORAL_TABLET | Freq: Two times a day (BID) | ORAL | 0 refills | Status: AC

## 2018-10-06 NOTE — Progress Notes (Signed)
Brittney Code, MD   Chief Complaint  Patient presents with  . Vaginal Discharge    sour odor, no itchiness or irritation x 2 weeks  . Urinary Tract Infection    was having burning sensation and frequency urinating, no  x 2 weeks    HPI:      Brittney Duran is a 48 y.o. [id number] who LMP was Patient's last menstrual period was 09/29/2018 (approximate)., presents today for increased d/c with sour odor, no irritation for 2 wks. Hx of BV in distant past. Pt also with occas dysuria and urinary frequency with good flow for 2 wks. No LBP, pelvic pain, fevers, hematuria. No recent abx use. Had changed soaps and sx improved after going back to original soap. Pt is sex active, no new partners. Has paragard IUD.   Past Medical History:  Diagnosis Date  . Anemia   . Anxiety    Panic attacks  . Complication of anesthesia    pt reports "local" wears off quickly  . Depression   . Environmental allergies   . Hemorrhoids   . Leaky heart valve   . Sciatica 09/08/2017  . Wears contact lenses     Past Surgical History:  Procedure Laterality Date  . Caesaran section  1989  . COLONOSCOPY WITH PROPOFOL N/A 10/07/2014   Procedure: COLONOSCOPY WITH PROPOFOL;  Surgeon: [name], MD;  [facility];  Service: Endoscopy;  Laterality: N/A;  Latex  . HERNIA REPAIR  1978    Family History  Problem Relation Age of Onset  . Hypertension Mother   . Seizures Brother   . Pancreatic cancer Other     Social History   Socioeconomic History  . Marital status: Married    Spouse name: duglaus Derks  . Number of children: 5  . Years of education: 55  . Highest education level: Some college, no degree  Occupational History  . Occupation: [employer]  Social Needs  . Financial resource strain: Not very hard  . Food insecurity    Worry: Sometimes true    Inability: Sometimes true  . Transportation needs    Medical: No    Non-medical: No  Tobacco Use  . Smoking status: Former  Smoker    Years: 15.00  . Smokeless tobacco: Never Used  Substance and Sexual Activity  . Alcohol use: Not Currently    Alcohol/week: 0.0 standard drinks    Comment: "socially"  . Drug use: No  . Sexual activity: Yes    Birth control/protection: I.U.D.    Comment: Paragard  Lifestyle  . Physical activity    Days per week: 0 days    Minutes per session: 0 min  . Stress: Rather much  Relationships  . Social Musician on phone: Not on file    Gets together: Not on file    Attends religious service: More than 4 times per year    Active member of club or organization: Yes    Attends meetings of clubs or organizations: More than 4 times per year    Relationship status: Married  . Intimate partner violence    Fear of current or ex partner: No    Emotionally abused: No    Physically abused: No    Forced sexual activity: No  Other Topics Concern  . Not on file  Social History Narrative   Lives at home w/ her husband and children   Left-handed   Drinks 1 cup of  coffee per day    Outpatient Medications Prior to Visit  Medication Sig Dispense Refill  . ARIPiprazole (ABILIFY) 20 MG tablet TAKE 1 TABLET BY MOUTH EVERY DAY 90 tablet 0  . aspirin 81 MG tablet Take 162 mg by mouth daily.    Marland Kitchen. buPROPion (WELLBUTRIN XL) 150 MG 24 hr tablet TAKE 1 TABLET BY MOUTH EVERY DAY WITH BREAKFAST 90 tablet 1  . cetirizine-pseudoephedrine (ZYRTEC-D) 5-120 MG per tablet Take 1 tablet by mouth 2 (two) times daily as needed for allergies.    . Cholecalciferol (VITAMIN D3) 5000 units CAPS Take 1 capsule by mouth daily.    . hydrOXYzine (VISTARIL) 25 MG capsule TAKE 1 CAPSULE BY MOUTH EVERY 6 HOURS PAN/INSOMNIA 360 capsule 1  . ibuprofen (ADVIL,MOTRIN) 800 MG tablet Take 1 tablet (800 mg total) by mouth every 8 (eight) hours as needed for moderate pain. 15 tablet 0  . lamoTRIgine (LAMICTAL) 25 MG tablet TAKE 1 TABLET (25 MG TOTAL) BY MOUTH DAILY. FOR MOOD 90 tablet 1  . PARAGARD INTRAUTERINE  COPPER IUD IUD 1 each by Intrauterine route once.    . phentermine (ADIPEX-P) 37.5 MG tablet Take 1 tablet (37.5 mg total) by mouth daily before breakfast. 30 tablet 1  . vitamin B-12 (CYANOCOBALAMIN) 1000 MCG tablet Take 1,000 mcg by mouth daily as needed (energy). Reported on 05/08/2015    . mirtazapine (REMERON) 15 MG tablet Take 1 tablet (15 mg total) by mouth at bedtime. 90 tablet 0  . azithromycin (ZITHROMAX) 250 MG tablet z-pack - take as directed for 5 days 6 tablet 0   No facility-administered medications prior to visit.       ROS:  Review of Systems  Constitutional: Negative for fatigue, fever and unexpected weight change.  Respiratory: Negative for cough, shortness of breath and wheezing.   Cardiovascular: Negative for chest pain, palpitations and leg swelling.  Gastrointestinal: Negative for blood in stool, constipation, diarrhea, nausea and vomiting.  Endocrine: Negative for cold intolerance, heat intolerance and polyuria.  Genitourinary: Positive for dysuria, frequency and vaginal discharge. Negative for dyspareunia, flank pain, genital sores, hematuria, menstrual problem, pelvic pain, urgency, vaginal bleeding and vaginal pain.  Musculoskeletal: Positive for back pain. Negative for joint swelling and myalgias.  Skin: Negative for rash.  Neurological: Negative for dizziness, syncope, light-headedness, numbness and headaches.  Hematological: Negative for adenopathy.  Psychiatric/Behavioral: Positive for agitation and dysphoric mood. Negative for confusion, sleep disturbance and suicidal ideas. The patient is not nervous/anxious.    BREAST: No symptoms   OBJECTIVE:   Vitals:  BP 120/80   Ht 5\' 7"  (1.702 m)   Wt 222 lb (100.7 kg)   LMP 09/29/2018 (Approximate)   BMI 34.77 kg/m   Physical Exam Vitals signs reviewed.  Constitutional:      Appearance: She is well-developed.  Neck:     Musculoskeletal: Normal range of motion.  Pulmonary:     Effort: Pulmonary  effort is normal.  Genitourinary:    General: Normal vulva.     Pubic Area: No rash.      Labia:        Right: No rash, tenderness or lesion.        Left: No rash, tenderness or lesion.      Vagina: Vaginal discharge present. No erythema or tenderness.     Cervix: Normal.     Uterus: Normal. Not enlarged and not tender.      Adnexa: Right adnexa normal and left adnexa normal.  Right: No mass or tenderness.         Left: No mass or tenderness.    Musculoskeletal: Normal range of motion.  Skin:    General: Skin is warm and dry.  Neurological:     General: No focal deficit present.     Mental Status: She is alert and oriented to person, place, and time.  Psychiatric:        Mood and Affect: Mood normal.        Behavior: Behavior normal.        Thought Content: Thought content normal.        Judgment: Judgment normal.     Results: Results for orders placed or performed in visit on 10/06/18 (from the past 24 hour(s))  POCT Wet Prep with KOH     Status: Abnormal   Collection Time: 10/06/18  1:35 PM  Result Value Ref Range   Trichomonas, UA Negative    Clue Cells Wet Prep HPF POC few    Epithelial Wet Prep HPF POC     Yeast Wet Prep HPF POC neg    Bacteria Wet Prep HPF POC     RBC Wet Prep HPF POC     WBC Wet Prep HPF POC     KOH Prep POC Negative Negative  POCT Urinalysis Dipstick     Status: Abnormal   Collection Time: 10/06/18  1:36 PM  Result Value Ref Range   Color, UA amber    Clarity, UA clear    Glucose, UA Negative Negative   Bilirubin, UA neg    Ketones, UA neg    Spec Grav, UA 1.015 1.010 - 1.025   Blood, UA neg    pH, UA 7.0 5.0 - 8.0   Protein, UA Negative Negative   Urobilinogen, UA     Nitrite, UA neg    Leukocytes, UA Small (1+) (A) Negative   Appearance     Odor       Assessment/Plan: Vaginal discharge - Plan: POCT Wet Prep with KOH, metroNIDAZOLE (FLAGYL) 500 MG tablet; Pos exam, questionable wet prep. Treat empirically with flagyl. Will  culture if sx persist after tx. F/u prn.   Dysuria - Plan: POCT Urinalysis Dipstick, Urine Culture; Neg UA. Check C&S. Will f/u if pos. Sx improved after returning to original soap.   Meds ordered this encounter  Medications  . metroNIDAZOLE (FLAGYL) 500 MG tablet    Sig: Take 1 tablet (500 mg total) by mouth 2 (two) times daily for 7 days.    Dispense:  14 tablet    Refill:  0    Order Specific Question:   Supervising Provider    Answer:   Gae Dry [[id number]]      Return in about 1 year (around 10/06/2019).  Alicia B. Copland, PA-C 10/06/2018 1:39 PM

## 2018-10-06 NOTE — Patient Instructions (Signed)
I value your feedback and entrusting us with your care. If you get a [facility] patient survey, I would appreciate you taking the time to let us know about your experience today. Thank you! 

## 2018-10-08 LAB — URINE CULTURE

## 2018-10-09 ENCOUNTER — Ambulatory Visit (INDEPENDENT_AMBULATORY_CARE_PROVIDER_SITE_OTHER): Payer: 59 | Admitting: Psychiatry

## 2018-10-09 ENCOUNTER — Other Ambulatory Visit: Payer: Self-pay

## 2018-10-09 ENCOUNTER — Telehealth: Payer: Self-pay | Admitting: Obstetrics and Gynecology

## 2018-10-09 ENCOUNTER — Encounter: Payer: Self-pay | Admitting: Psychiatry

## 2018-10-09 ENCOUNTER — Telehealth: Payer: Self-pay

## 2018-10-09 DIAGNOSIS — F5105 Insomnia due to other mental disorder: Secondary | ICD-10-CM

## 2018-10-09 DIAGNOSIS — F3181 Bipolar II disorder: Secondary | ICD-10-CM

## 2018-10-09 DIAGNOSIS — F410 Panic disorder [episodic paroxysmal anxiety] without agoraphobia: Secondary | ICD-10-CM

## 2018-10-09 DIAGNOSIS — Z634 Disappearance and death of family member: Secondary | ICD-10-CM | POA: Diagnosis not present

## 2018-10-09 MED ORDER — LEVOFLOXACIN 250 MG PO TABS: 250.0000 mg | ORAL_TABLET | Freq: Every day | ORAL | 0 refills | Status: AC

## 2018-10-09 MED ORDER — ESZOPICLONE 1 MG PO TABS: 1.0000 mg | ORAL_TABLET | Freq: Every evening | ORAL | 0 refills | Status: DC | PRN

## 2018-10-09 NOTE — Telephone Encounter (Signed)
I would suggest calling her health insurance plan . I am not aware about treatment facilities for that.

## 2018-10-09 NOTE — Telephone Encounter (Signed)
Pt aware of GBS on C&S. Allergic to PCN. Try levofloxacin. Rx eRxd. F/u prn. Flagyl improving vag d/c sx.

## 2018-10-09 NOTE — Progress Notes (Signed)
Virtual Visit via Video Note  I connected with Brittney Duran on 10/09/18 at 10:15 AM EDT by a video enabled telemedicine application and verified that I am speaking with the correct person using two identifiers.   I discussed the limitations of evaluation and management by telemedicine and the availability of in person appointments. The patient expressed understanding and agreed to proceed.   I discussed the assessment and treatment plan with the patient. The patient was provided an opportunity to ask questions and all were answered. The patient agreed with the plan and demonstrated an understanding of the instructions.   The patient was advised to call back or seek an in-person evaluation if the symptoms worsen or if the condition fails to improve as anticipated. [place] MD OP Progress Note  10/09/2018 11:53 AM Brittney Duran  MRN:  [id number]  Chief Complaint:  Chief Complaint    Follow-up     HPI: Brittney Duran is a 48 year old African-American female, married, unemployed, lives in [place], has a history of bipolar disorder type II, panic disorder, insomnia, bereavement, history of TIA was evaluated by telemedicine today.  Patient today reports she continues to struggle with some depression on and off.  She reports she is currently more anxious about the situation.  She reports her kids are currently being homeschooled and that is making her anxious.  Her husband is currently home and that has been helpful.  She reports she had a bad day 2 weeks ago however she feels much better now.  She continues to struggle with sleep.  She reports mirtazapine does not help anymore.  Discussed Lunesta.  Patient also reports she has not been following up with her therapist.  We will refer her to another therapist.  Provided her resources.  She denies any current suicidality, homicidality or perceptual disturbances.  Patient denies any other concerns today.   Visit Diagnosis:    ICD-10-CM   1. Bipolar 2  disorder, major depressive episode ([place])  F31.81   2. Panic disorder  F41.0   3. Insomnia due to mental condition  F51.05 eszopiclone (LUNESTA) 1 MG TABS tablet  4. Bereavement  Z63.4     Past Psychiatric History: I have reviewed past psychiatric history from my progress note on 04/18/2017.  Past Medical History:  Past Medical History:  Diagnosis Date  . Anemia   . Anxiety    Panic attacks  . Complication of anesthesia    pt reports "local" wears off quickly  . Depression   . Environmental allergies   . Hemorrhoids   . Leaky heart valve   . Sciatica 09/08/2017  . Wears contact lenses     Past Surgical History:  Procedure Laterality Date  . Caesaran section  1989  . COLONOSCOPY WITH PROPOFOL N/A 10/07/2014   Procedure: COLONOSCOPY WITH PROPOFOL;  Surgeon: Lucilla Lame, MD;  Location: [place];  Service: Endoscopy;  Laterality: N/A;  Latex  . HERNIA REPAIR  1978    Family Psychiatric History: I have reviewed family psychiatric history from my progress note on 04/18/2017. Family History:  Family History  Problem Relation Age of Onset  . Hypertension Mother   . Seizures Brother   . Pancreatic cancer Other     Social History: Reviewed social history from my progress note on 04/18/2017. Social History   Socioeconomic History  . Marital status: Married    Spouse name: duglaus Weinberg  . Number of children: 5  . Years of education: 61  . Highest education level:  Some college, no degree  Occupational History  . Occupation: [employer]  Social Needs  . Financial resource strain: Not very hard  . Food insecurity    Worry: Sometimes true    Inability: Sometimes true  . Transportation needs    Medical: No    Non-medical: No  Tobacco Use  . Smoking status: Former Smoker    Years: 15.00  . Smokeless tobacco: Never Used  Substance and Sexual Activity  . Alcohol use: Not Currently    Alcohol/week: 0.0 standard drinks    Comment: "socially"  . Drug use: No  . Sexual  activity: Yes    Birth control/protection: I.U.D.    Comment: Paragard  Lifestyle  . Physical activity    Days per week: 0 days    Minutes per session: 0 min  . Stress: Rather much  Relationships  . Social Musician on phone: Not on file    Gets together: Not on file    Attends religious service: More than 4 times per year    Active member of club or organization: Yes    Attends meetings of clubs or organizations: More than 4 times per year    Relationship status: Married  Other Topics Concern  . Not on file  Social History Narrative   Lives at home w/ her husband and children   Left-handed   Drinks 1 cup of coffee per day    Allergies:  Allergies  Allergen Reactions  . Lithium     Face swelling   . Penicillins Other (See Comments)    Unknown reaction Has patient had a PCN reaction causing immediate rash, facial/tongue/throat swelling, SOB or lightheadedness with hypotension: YES Has patient had a PCN reaction causing severe rash involving mucus membranes or skin necrosis: NO Has patient had a PCN reaction that required hospitalizationNO Has patient had a PCN reaction occurring within the last 10 years: NO If all of the above answers are "NO", then may proceed with Cephalosporin use.  . Shellfish Allergy Swelling    lips  . Latex Rash    Metabolic Disorder Labs: Lab Results  Component Value Date   HGBA1C 5.4 05/09/2015   MPG 108 05/09/2015   No results found for: PROLACTIN Lab Results  Component Value Date   CHOL 128 05/09/2015   TRIG 79 05/09/2015   HDL 41 05/09/2015   CHOLHDL 3.1 05/09/2015   VLDL 16 05/09/2015   LDLCALC 71 05/09/2015   Lab Results  Component Value Date   TSH 2.093 08/26/2017    Therapeutic Level Labs: No results found for: LITHIUM No results found for: VALPROATE No components found for:  CBMZ  Current Medications: Current Outpatient Medications  Medication Sig Dispense Refill  . ARIPiprazole (ABILIFY) 20 MG tablet  TAKE 1 TABLET BY MOUTH EVERY DAY 90 tablet 0  . aspirin 81 MG tablet Take 162 mg by mouth daily.    Marland Kitchen buPROPion (WELLBUTRIN XL) 150 MG 24 hr tablet TAKE 1 TABLET BY MOUTH EVERY DAY WITH BREAKFAST 90 tablet 1  . cetirizine-pseudoephedrine (ZYRTEC-D) 5-120 MG per tablet Take 1 tablet by mouth 2 (two) times daily as needed for allergies.    . Cholecalciferol (VITAMIN D3) 5000 units CAPS Take 1 capsule by mouth daily.    . eszopiclone (LUNESTA) 1 MG TABS tablet Take 1 tablet (1 mg total) by mouth at bedtime as needed for sleep. Take immediately before bedtime 15 tablet 0  . hydrOXYzine (VISTARIL) 25 MG capsule TAKE 1 CAPSULE  BY MOUTH EVERY 6 HOURS PAN/INSOMNIA 360 capsule 1  . ibuprofen (ADVIL,MOTRIN) 800 MG tablet Take 1 tablet (800 mg total) by mouth every 8 (eight) hours as needed for moderate pain. 15 tablet 0  . lamoTRIgine (LAMICTAL) 25 MG tablet TAKE 1 TABLET (25 MG TOTAL) BY MOUTH DAILY. FOR MOOD 90 tablet 1  . levofloxacin (LEVAQUIN) 250 MG tablet Take 1 tablet (250 mg total) by mouth daily for 3 days. 3 tablet 0  . metroNIDAZOLE (FLAGYL) 500 MG tablet Take 1 tablet (500 mg total) by mouth 2 (two) times daily for 7 days. 14 tablet 0  . PARAGARD INTRAUTERINE COPPER IUD IUD 1 each by Intrauterine route once.    . phentermine (ADIPEX-P) 37.5 MG tablet Take 1 tablet (37.5 mg total) by mouth daily before breakfast. 30 tablet 1  . vitamin B-12 (CYANOCOBALAMIN) 1000 MCG tablet Take 1,000 mcg by mouth daily as needed (energy). Reported on 05/08/2015     No current facility-administered medications for this visit.      Musculoskeletal: Strength & Muscle Tone: UTA Gait & Station: normal Patient leans: N/A  Psychiatric Specialty Exam: Review of Systems  Psychiatric/Behavioral: The patient is nervous/anxious and has insomnia.   All other systems reviewed and are negative.   Last menstrual period 09/29/2018.There is no height or weight on file to calculate BMI.  General Appearance: Casual  Eye  Contact:  Fair  Speech:  Clear and Coherent  Volume:  Normal  Mood:  Anxious  Affect:  Congruent  Thought Process:  Goal Directed and Descriptions of Associations: Intact  Orientation:  Full (Time, Place, and Person)  Thought Content: Logical   Suicidal Thoughts:  No  Homicidal Thoughts:  No  Memory:  Immediate;   Fair Recent;   Fair Remote;   Fair  Judgement:  Fair  Insight:  Fair  Psychomotor Activity:  Normal  Concentration:  Concentration: Fair and Attention Span: Fair  Recall:  [employer] of Knowledge: Fair  Language: Fair  Akathisia:  No  Handed:  Right  AIMS (if indicated):Denies tremors, rigidity  Assets:  Communication Skills Desire for Improvement Social Support  ADL's:  Intact  Cognition: WNL  Sleep:  Poor   Screenings: PHQ2-9     Office Visit from 03/24/2018 in [facility], [facility] Office Visit from 09/09/2017 in [facility], [facility] Office Visit from 08/26/2017 in [facility], [facility]  PHQ-2 Total Score  0  4  6  PHQ-9 Total Score  -  15  21       Assessment and Plan: Zayra is a 48 year old African-American female who has a history of bipolar disorder, panic attacks, insomnia, history of TIA, history of trauma was evaluated by telemedicine today.  Patient with multiple psychosocial stressors including daughter's health issues, coronavirus outbreak, her kids being homeschooled, financial issues and her own health issues.  Patient continues to struggle with anxiety symptoms and will benefit from medication readjustment as well as psychotherapy sessions.  Patient has been noncompliant with psychotherapy and will discuss resources.  Plan Bipolar disorder- some progress Abilify 20 mg p.o. daily Lamictal 25 mg p.o. daily Wellbutrin XL 300 mg p.o. daily.  For panic attacks-some improvement Refer for psychotherapy sessions-she has been noncompliant with therapy with therapist here in the office. We will refer her to another therapist-will  mail community resources to her today. Hydroxyzine as needed  Insomnia- unstable Discontinue mirtazapine Start Lunesta 1 mg p.o. nightly as needed   Follow-up in clinic in 3 weeks  or sooner if needed.  October 20 at 2 PM  I have spent atleast 15 minutes non face to face with patient today. More than 50 % of the time was spent for psychoeducation and supportive psychotherapy and care coordination. This note was generated in part or whole with voice recognition software. Voice recognition is usually quite accurate but there are transcription errors that can and very often do occur. I apologize for any typographical errors that were not detected and corrected.         Jomarie LongsSaramma Rabon Scholle, MD 10/09/2018, 11:53 AM

## 2018-10-09 NOTE — Telephone Encounter (Signed)
pt left a message that she check with her insurance and she been looking up acupuncture with post stroke with mental disorder and she would like for you to adviser her on someone or if she reccomends.

## 2018-10-18 ENCOUNTER — Other Ambulatory Visit: Payer: Self-pay | Admitting: Psychiatry

## 2018-10-18 DIAGNOSIS — F5105 Insomnia due to other mental disorder: Secondary | ICD-10-CM

## 2018-10-18 DIAGNOSIS — F410 Panic disorder [episodic paroxysmal anxiety] without agoraphobia: Secondary | ICD-10-CM

## 2018-11-02 ENCOUNTER — Telehealth: Payer: Self-pay

## 2018-11-02 NOTE — Telephone Encounter (Signed)
Brittney Duran could you please let medical records know . Not sure why .

## 2018-11-02 NOTE — Telephone Encounter (Signed)
pt called and is upset that with her disability only received 2 notes from [employer]. she states she seen her more that twice and wanted to know why .  she wanted dr Shea Evans to know this info.

## 2018-11-06 ENCOUNTER — Other Ambulatory Visit: Payer: Self-pay | Admitting: Psychiatry

## 2018-11-06 DIAGNOSIS — F410 Panic disorder [episodic paroxysmal anxiety] without agoraphobia: Secondary | ICD-10-CM

## 2018-11-06 DIAGNOSIS — F5105 Insomnia due to other mental disorder: Secondary | ICD-10-CM

## 2018-11-07 ENCOUNTER — Encounter: Payer: Self-pay | Admitting: Psychiatry

## 2018-11-07 ENCOUNTER — Ambulatory Visit (INDEPENDENT_AMBULATORY_CARE_PROVIDER_SITE_OTHER): Payer: 59 | Admitting: Psychiatry

## 2018-11-07 ENCOUNTER — Other Ambulatory Visit: Payer: Self-pay

## 2018-11-07 DIAGNOSIS — F410 Panic disorder [episodic paroxysmal anxiety] without agoraphobia: Secondary | ICD-10-CM

## 2018-11-07 DIAGNOSIS — F3181 Bipolar II disorder: Secondary | ICD-10-CM

## 2018-11-07 DIAGNOSIS — F5105 Insomnia due to other mental disorder: Secondary | ICD-10-CM

## 2018-11-07 MED ORDER — ARIPIPRAZOLE 20 MG PO TABS: 20.0000 mg | ORAL_TABLET | Freq: Every day | ORAL | 0 refills | Status: DC

## 2018-11-07 MED ORDER — HYDROXYZINE PAMOATE 25 MG PO CAPS: 25.0000 mg | ORAL_CAPSULE | Freq: Two times a day (BID) | ORAL | 1 refills | Status: DC | PRN

## 2018-11-07 MED ORDER — LAMOTRIGINE 25 MG PO TABS: 75.0000 mg | ORAL_TABLET | Freq: Every day | ORAL | 1 refills | Status: DC

## 2018-11-07 MED ORDER — ESZOPICLONE 2 MG PO TABS: 2.0000 mg | ORAL_TABLET | Freq: Every evening | ORAL | 1 refills | Status: DC | PRN

## 2018-11-07 MED ORDER — BUPROPION HCL ER (XL) 150 MG PO TB24: ORAL_TABLET | ORAL | 1 refills | Status: DC
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
## 2018-11-09 ENCOUNTER — Telehealth: Payer: Self-pay

## 2018-11-09 DIAGNOSIS — F3181 Bipolar II disorder: Secondary | ICD-10-CM

## 2018-11-09 MED ORDER — LAMOTRIGINE 25 MG PO TABS: 75.0000 mg | ORAL_TABLET | Freq: Every day | ORAL | 0 refills | Status: DC

## 2018-11-09 NOTE — Telephone Encounter (Signed)
received a fax stating that insurance requires a 90 day supply of medication

## 2018-11-09 NOTE — Telephone Encounter (Signed)
Sent 90-day supply of Lamictal to pharmacy.

## 2018-11-13 ENCOUNTER — Telehealth: Payer: Self-pay | Admitting: Psychiatry

## 2018-11-13 NOTE — Telephone Encounter (Signed)
Left message for patient that she cannot log into my chart to get her appointments, medication list and changes done during her visit.  Left message that for future sessions and after visit summary can be printed out and mailed to her if she wants mailed one. Advised her to call the clinic back with questions.

## 2018-11-16 ENCOUNTER — Telehealth: Payer: Self-pay
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
## 2018-11-17 ENCOUNTER — Ambulatory Visit: Payer: 59 | Admitting: Nurse Practitioner

## 2018-11-21 DIAGNOSIS — Z0289 Encounter for other administrative examinations: Secondary | ICD-10-CM

## 2018-11-24 NOTE — Progress Notes (Unsigned)
   THERAPIST PROGRESS NOTE  Session Time: 3  Participation Level: Minimal  Type of Therapy: Individual Therapy  Treatment Goals addressed: Coping and Diagnosis: Bipolar  Interventions: CBT and Motivational Interviewing  Summary: Brittney Duran is a 48 y.o. female who presents with continued symptoms of her diagnosis. Therapist met with Patient in an outpatient setting to assess current mood and assist with making progress towards goals through the use of therapeutic intervention. Therapist did a brief mood check, assessing anger, fear, disgust, excitement, happiness, and sadness.  Patient reports "no changes" as it relates to her mood.  She reports that "it feels like an uphill battle and I am constantly fighting and losing."  Activley listened and provided support as she listed her stressors and discussed triggers.  Explored coping strategies that she has attempted that have been successful and unsuccessful for her. Therapist re-educated, explained and assisted the Patient on symptoms of her mental illness to assist her with recognizing and understanding her problematic behaviors. Therapist requested Patient verbalize her struggles with inappropriate reactions toward others and assisted her with improving these interactions by engaging her in an activity working on continuing to develop social skills and learn appropriate interpersonal relationship skills.   Encouraged Patient to attempt IOP again.  Declines service (financial and children).  Suicidal/Homicidal: No  Plan: Return again in 2 weeks.  Diagnosis: Axis I: Bipolar, Depressed    Axis II: No diagnosis    Lubertha South, LCSW 07/20/2018

## 2018-11-24 NOTE — Progress Notes (Unsigned)
   THERAPIST PROGRESS NOTE  Session Time: 58  Participation Level: Active  Type of Therapy: Individual Therapy  Treatment Goals addressed: Coping and Diagnosis: Bipolar  Interventions: CBT and Motivational Interviewing  Summary: AMSI GRIMLEY is a 48 y.o. female who presents with continued symptoms of her diangosis.  Therapist met with Patient in an outpatient setting to assess current mood and assist with making progress towards goals through the use of therapeutic intervention. Therapist did a brief mood check, assessing anger, fear, disgust, excitement, happiness, and sadness.  Patient reports "sadness" mostly every day.  She reports having difficulty with getting out of the bed and attending to her children. She cried throughout the session and reports a "loss of hope."  Discussion of coping skills and having a daily schedule.  Transitioned to discuss medication management.  Explored how she takes her medication and encouraged her to have conversations of side effects and efficacy with her psychiatrist. Therapist reviewed with Patient build social supports.  Therapist discussed with Patient the importance of social support.  Therpist utilized [employer] Topic 4: Building Social Supports to assisted Patient with strategies for getting closer to people.  Therapist role-played with Patient how to develop closer relationships.  Therapist allowed Patient time to complete the exercise: Things you can say to increase closeness. At the end of the session, Therapist reviewed with Patient things you can do to develop closer relationships. .   Suicidal/Homicidal: No  Plan: Return again in 2 weeks.  Diagnosis: Axis I: Bipolar, Depressed    Axis II: No diagnosis    Lubertha South, LCSW 08/03/2018

## 2018-11-27 ENCOUNTER — Encounter: Payer: Self-pay | Admitting: Psychiatry

## 2018-11-27 ENCOUNTER — Telehealth: Payer: Self-pay

## 2018-11-27 ENCOUNTER — Ambulatory Visit (INDEPENDENT_AMBULATORY_CARE_PROVIDER_SITE_OTHER): Payer: 59 | Admitting: Psychiatry

## 2018-11-27 ENCOUNTER — Other Ambulatory Visit: Payer: Self-pay

## 2018-11-27 DIAGNOSIS — F5105 Insomnia due to other mental disorder: Secondary | ICD-10-CM | POA: Diagnosis not present

## 2018-11-27 DIAGNOSIS — F3181 Bipolar II disorder: Secondary | ICD-10-CM | POA: Diagnosis not present

## 2018-11-27 DIAGNOSIS — F410 Panic disorder [episodic paroxysmal anxiety] without agoraphobia: Secondary | ICD-10-CM | POA: Diagnosis not present

## 2018-11-27 NOTE — Progress Notes (Signed)
Virtual Visit via Video Note  I connected with Brittney Duran on 11/27/18 at 11:30 AM EST by a video enabled telemedicine application and verified that I am speaking with the correct person using two identifiers.   I discussed the limitations of evaluation and management by telemedicine and the availability of in person appointments. The patient expressed understanding and agreed to proceed.     I discussed the assessment and treatment plan with the patient. The patient was provided an opportunity to ask questions and all were answered. The patient agreed with the plan and demonstrated an understanding of the instructions.   The patient was advised to call back or seek an in-person evaluation if the symptoms worsen or if the condition fails to improve as anticipated.   BH MD OP Progress Note  11/27/2018 12:30 PM Brittney Duran  MRN:  [id number]  Chief Complaint:  Chief Complaint    Follow-up     HPI: Brittney Duran is a 48 year old African-American female, married, unemployed, lives in [place], has a history of bipolar disorder type II, panic disorder, insomnia, history of TIA was evaluated by telemedicine today.  Patient today reports she is currently making some progress.  She had her acupuncture visit today.  She reports that helped her a lot.  She reports she felt like she was able to escape from all her stressors for a while and that may also have contributed to her feeling better after the visit.  She wants to continue to keep the appointments.  She reports she got a call from Ms. Felecia Jan about rescheduling her appointment.  She hence has not had her therapy session yet.  She reports she however is motivated to start therapy sessions soon.  She reports last week she had a day when she felt sad and tired and had a lot of negative thoughts.  She however reports she is currently feeling better.  She currently denies any suicidality, homicidality or perceptual disturbances.  She  reports she did not pick up her Lunesta 2 mg prescription from the pharmacy.  She has been using the 1 mg as needed.  She also uses melatonin.  Overall her sleep has improved.    Patient denies any other concerns today. Visit Diagnosis:    ICD-10-CM   1. Bipolar 2 disorder, major depressive episode (HCC)  F31.81   2. Panic disorder  F41.0   3. Insomnia due to mental condition  F51.05     Past Psychiatric History: Reviewed past psychiatric history from my progress note on 04/18/2017  Past Medical History:  Past Medical History:  Diagnosis Date  . Anemia   . Anxiety    Panic attacks  . Complication of anesthesia    pt reports "local" wears off quickly  . Depression   . Environmental allergies   . Hemorrhoids   . Leaky heart valve   . Sciatica 09/08/2017  . Wears contact lenses     Past Surgical History:  Procedure Laterality Date  . Caesaran section  1989  . COLONOSCOPY WITH PROPOFOL N/A 10/07/2014   Procedure: COLONOSCOPY WITH PROPOFOL;  Surgeon: [name], MD;  Location: [facility];  Service: Endoscopy;  Laterality: N/A;  Latex  . HERNIA REPAIR  1978    Family Psychiatric History: Reviewed family psychiatric history from my progress note on 04/18/2017  Family History:  Family History  Problem Relation Age of Onset  . Hypertension Mother   . Seizures Brother   . Pancreatic cancer Other  Social History: Reviewed social history from my progress note on 04/18/2017 Social History   Socioeconomic History  . Marital status: Married    Spouse name: duglaus Losey  . Number of children: 5  . Years of education: 74  . Highest education level: Some college, no degree  Occupational History  . Occupation: [employer]  Social Needs  . Financial resource strain: Not very hard  . Food insecurity    Worry: Sometimes true    Inability: Sometimes true  . Transportation needs    Medical: No    Non-medical: No  Tobacco Use  . Smoking status: Former Smoker    Years: 15.00   . Smokeless tobacco: Never Used  Substance and Sexual Activity  . Alcohol use: Not Currently    Alcohol/week: 0.0 standard drinks    Comment: "socially"  . Drug use: No  . Sexual activity: Yes    Birth control/protection: I.U.D.    Comment: Paragard  Lifestyle  . Physical activity    Days per week: 0 days    Minutes per session: 0 min  . Stress: Rather much  Relationships  . Social Herbalist on phone: Not on file    Gets together: Not on file    Attends religious service: More than 4 times per year    Active member of club or organization: Yes    Attends meetings of clubs or organizations: More than 4 times per year    Relationship status: Married  Other Topics Concern  . Not on file  Social History Narrative   Lives at home w/ her husband and children   Left-handed   Drinks 1 cup of coffee per day    Allergies:  Allergies  Allergen Reactions  . Lithium     Face swelling   . Penicillins Other (See Comments)    Unknown reaction Has patient had a PCN reaction causing immediate rash, facial/tongue/throat swelling, SOB or lightheadedness with hypotension: YES Has patient had a PCN reaction causing severe rash involving mucus membranes or skin necrosis: NO Has patient had a PCN reaction that required hospitalizationNO Has patient had a PCN reaction occurring within the last 10 years: NO If all of the above answers are "NO", then may proceed with Cephalosporin use.  . Shellfish Allergy Swelling    lips  . Latex Rash    Metabolic Disorder Labs: Lab Results  Component Value Date   HGBA1C 5.4 05/09/2015   MPG 108 05/09/2015   No results found for: PROLACTIN Lab Results  Component Value Date   CHOL 128 05/09/2015   TRIG 79 05/09/2015   HDL 41 05/09/2015   CHOLHDL 3.1 05/09/2015   VLDL 16 05/09/2015   LDLCALC 71 05/09/2015   Lab Results  Component Value Date   TSH 2.093 08/26/2017    Therapeutic Level Labs: No results found for: LITHIUM No  results found for: VALPROATE No components found for:  CBMZ  Current Medications: Current Outpatient Medications  Medication Sig Dispense Refill  . ARIPiprazole (ABILIFY) 20 MG tablet Take 1 tablet (20 mg total) by mouth daily. 90 tablet 0  . aspirin 81 MG tablet Take 162 mg by mouth daily.    Marland Kitchen buPROPion (WELLBUTRIN XL) 150 MG 24 hr tablet TAKE 1 TABLET BY MOUTH EVERY DAY WITH BREAKFAST 90 tablet 1  . Cholecalciferol (VITAMIN D3) 5000 units CAPS Take 1 capsule by mouth daily.    . hydrOXYzine (VISTARIL) 25 MG capsule Take 1 capsule (25 mg total) by  mouth 2 (two) times daily as needed. For anxiety attacks 60 capsule 1  . ibuprofen (ADVIL,MOTRIN) 800 MG tablet Take 1 tablet (800 mg total) by mouth every 8 (eight) hours as needed for moderate pain. 15 tablet 0  . lamoTRIgine (LAMICTAL) 25 MG tablet Take 3 tablets (75 mg total) by mouth daily. 270 tablet 0  . PARAGARD INTRAUTERINE COPPER IUD IUD 1 each by Intrauterine route once.    . phentermine (ADIPEX-P) 37.5 MG tablet Take 1 tablet (37.5 mg total) by mouth daily before breakfast. 30 tablet 1  . vitamin B-12 (CYANOCOBALAMIN) 1000 MCG tablet Take 1,000 mcg by mouth daily as needed (energy). Reported on 05/08/2015    . cetirizine-pseudoephedrine (ZYRTEC-D) 5-120 MG per tablet Take 1 tablet by mouth 2 (two) times daily as needed for allergies.    Marland Kitchen eszopiclone (LUNESTA) 2 MG TABS tablet Take 1 tablet (2 mg total) by mouth at bedtime as needed for sleep. Take immediately before bedtime (Patient not taking: Reported on 11/27/2018) 30 tablet 1   No current facility-administered medications for this visit.      Musculoskeletal: Strength & Muscle Tone: UTA Gait & Station: Observed as seated Patient leans: N/A  Psychiatric Specialty Exam: Review of Systems  Psychiatric/Behavioral: Negative for depression, hallucinations, substance abuse and suicidal ideas. The patient is not nervous/anxious and does not have insomnia.   All other systems reviewed  and are negative.   There were no vitals taken for this visit.There is no height or weight on file to calculate BMI.  General Appearance: Casual  Eye Contact:  Fair  Speech:  Clear and Coherent  Volume:  Normal  Mood:  Euthymic  Affect:  Congruent  Thought Process:  Goal Directed and Descriptions of Associations: Intact  Orientation:  Full (Time, Place, and Person)  Thought Content: Logical   Suicidal Thoughts:  No  Homicidal Thoughts:  No  Memory:  Immediate;   Fair Recent;   Fair Remote;   Fair  Judgement:  Fair  Insight:  Fair  Psychomotor Activity:  Normal  Concentration:  Concentration: Fair and Attention Span: Fair  Recall:  [employer] of Knowledge: Fair  Language: Fair  Akathisia:  No  Handed:  Right  AIMS (if indicated):denies tremors, rigidity  Assets:  Communication Skills Desire for Improvement Housing Social Support  ADL's:  Intact  Cognition: WNL  Sleep:  Fair   Screenings: PHQ2-9     [facility]/24/2018 in [facility], [facility] Office Visit from 09/09/2017 in [facility], [facility] Office Visit from 08/26/2017 in [facility], [facility]  PHQ-2 Total Score  0  4  6  PHQ-9 Total Score  -  15  21       Assessment and Plan: Monzerrat is a 48 year old African-American female who has a history of bipolar disorder, panic attacks, insomnia, history of TIA, history of trauma was evaluated by telemedicine today.  She is biologically predisposed due to her multiple health problems.  Patient with psychosocial stressors including her health issues, current pandemic, financial problems.  Patient with history of noncompliance to medications as well as psychotherapy sessions, currently is motivated to start psychotherapy sessions.  She continues to be noncompliant with her sleep medication however reports sleep is improved.  Plan as noted below.  Plan Bipolar disorder-improving Abilify 20 mg p.o. daily Lamictal 75 mg p.o. daily Wellbutrin XL  150 mg p.o. daily-she is currently taking it in the morning  Panic attacks-improving She will continue to work with  her therapist Ms. Felecia Janina Thompson She is also receiving acupuncture. Hydroxyzine 25 mg p.o. twice daily as needed  Insomnia-improving Patient has been noncompliant with Lunesta 2 mg p.o. nightly.  She agrees to pick it up from the pharmacy. She is also taking melatonin as needed  Follow-up in clinic in 4 weeks or sooner if needed.  December 9 at 11:30 AM  I have spent atleast 15 minutes non  face to face with patient today. More than 50 % of the time was spent for psychoeducation and supportive psychotherapy and care coordination. This note was generated in part or whole with voice recognition software. Voice recognition is usually quite accurate but there are transcription errors that can and very often do occur. I apologize for any typographical errors that were not detected and corrected.       Jomarie LongsSaramma Brittney Centrella, MD 11/27/2018, 12:30 PM

## 2018-11-27 NOTE — Telephone Encounter (Signed)
pt avs mailed out.

## 2018-12-07 ENCOUNTER — Telehealth: Payer: Self-pay

## 2018-12-07 NOTE — Telephone Encounter (Signed)
pt wanted to let you know that she is taking a probotic

## 2018-12-07 NOTE — Telephone Encounter (Signed)
Ok thanks 

## 2018-12-14 ENCOUNTER — Other Ambulatory Visit: Payer: Self-pay

## 2018-12-14 ENCOUNTER — Encounter (HOSPITAL_COMMUNITY): Payer: Self-pay | Admitting: Emergency Medicine

## 2018-12-14 ENCOUNTER — Emergency Department (HOSPITAL_COMMUNITY): Payer: 59

## 2018-12-14 ENCOUNTER — Emergency Department (HOSPITAL_COMMUNITY)
Admission: EM | Admit: 2018-12-14 | Discharge: 2018-12-14 | Disposition: A | Discharge status: Home / Self Care | Payer: 59 | Attending: Emergency Medicine | Admitting: Emergency Medicine

## 2018-12-14 DIAGNOSIS — M542 Cervicalgia: Secondary | ICD-10-CM | POA: Insufficient documentation

## 2018-12-14 DIAGNOSIS — Z5321 Procedure and treatment not carried out due to patient leaving prior to being seen by health care provider: Secondary | ICD-10-CM | POA: Diagnosis not present

## 2018-12-14 LAB — CBC
HCT: 34.9 % — ABNORMAL LOW (ref 36.0–46.0)
Hemoglobin: 11.2 g/dL — ABNORMAL LOW (ref 12.0–15.0)
MCH: 29.1 pg (ref 26.0–34.0)
MCHC: 32.1 g/dL (ref 30.0–36.0)
MCV: 90.6 fL (ref 80.0–100.0)
Platelets: 201 10*3/uL (ref 150–400)
RBC: 3.85 MIL/uL — ABNORMAL LOW (ref 3.87–5.11)
RDW: 13.7 % (ref 11.5–15.5)
WBC: 6 10*3/uL (ref 4.0–10.5)
nRBC: 0 % (ref 0.0–0.2)

## 2018-12-14 LAB — BASIC METABOLIC PANEL
Anion gap: 8 (ref 5–15)
BUN: 16 mg/dL (ref 6–20)
CO2: 22 mmol/L (ref 22–32)
Calcium: 9.7 mg/dL (ref 8.9–10.3)
Chloride: 109 mmol/L (ref 98–111)
Creatinine, Ser: 0.86 mg/dL (ref 0.44–1.00)
GFR calc Af Amer: >60 mL/min (ref >60)
GFR calc non Af Amer: >60 mL/min (ref >60)
Glucose, Bld: 104 mg/dL — ABNORMAL HIGH (ref 70–99)
Potassium: 4.4 mmol/L (ref 3.5–5.1)
Sodium: 139 mmol/L (ref 135–145)

## 2018-12-14 LAB — PROTIME-INR
INR: 0.9 (ref 0.8–1.2)
Prothrombin Time: 12.4 seconds (ref 11.4–15.2)

## 2018-12-14 LAB — I-STAT BETA HCG BLOOD, ED (MC, WL, AP ONLY): I-stat hCG, quantitative: <5 m[IU]/mL (ref <5)

## 2018-12-14 LAB — TROPONIN I (HIGH SENSITIVITY): Troponin I (High Sensitivity): 10 ng/L (ref <18)

## 2018-12-14 NOTE — ED Triage Notes (Signed)
Patient arrived with EMS from home reports left jaw and left neck pain onset this evening radiating to left arm , denies SOB , mild nausea , she received ASA 324 mg and 2 NTG sl with relief , denies pain at arrival . No fever or chills .

## 2018-12-17 NOTE — ED Notes (Signed)
Pt called the ED to ask where her medications are. Pt states that she was brought in by [facility] for a migraine this past Thursday 11/26, and that EMS gave the triage nurse her bag of medications. Pt states she left AMA and "forgot to get her medications back. Pt states she cannot get more from her doctor because she just filled her 38 day supply. This RN attempted to call EMS to figure out if they had her meds. No meds found in triage. This RN attempted to call patient back with an update, pt voicemail box is full and I was unable to leave a message.

## 2018-12-27 ENCOUNTER — Other Ambulatory Visit: Payer: Self-pay

## 2018-12-27 ENCOUNTER — Encounter: Payer: Self-pay | Admitting: Psychiatry

## 2018-12-27 ENCOUNTER — Ambulatory Visit (INDEPENDENT_AMBULATORY_CARE_PROVIDER_SITE_OTHER): Payer: 59 | Admitting: Psychiatry

## 2018-12-27 ENCOUNTER — Telehealth: Payer: Self-pay

## 2018-12-27 DIAGNOSIS — F3164 Bipolar disorder, current episode mixed, severe, with psychotic features: Secondary | ICD-10-CM | POA: Diagnosis not present

## 2018-12-27 DIAGNOSIS — F410 Panic disorder [episodic paroxysmal anxiety] without agoraphobia: Secondary | ICD-10-CM | POA: Diagnosis not present

## 2018-12-27 DIAGNOSIS — F5105 Insomnia due to other mental disorder: Secondary | ICD-10-CM

## 2018-12-27 DIAGNOSIS — F09 Unspecified mental disorder due to known physiological condition: Secondary | ICD-10-CM

## 2018-12-27 MED ORDER — ARIPIPRAZOLE 5 MG PO TABS: 5.0000 mg | ORAL_TABLET | Freq: Every day | ORAL | 0 refills | Status: DC

## 2018-12-27 NOTE — Telephone Encounter (Signed)
faxed over office notes and demo sheet -fax was  confirmed -pending pt appt.

## 2018-12-27 NOTE — Progress Notes (Signed)
Virtual Visit via Video Note  I connected with Nehemiah MassedKiawana D Munroe on 12/27/18 at 11:30 AM EST by a video enabled telemedicine application and verified that I am speaking with the correct person using two identifiers.   I discussed the limitations of evaluation and management by telemedicine and the availability of in person appointments. The patient expressed understanding and agreed to proceed.    I discussed the assessment and treatment plan with the patient. The patient was provided an opportunity to ask questions and all were answered. The patient agreed with the plan and demonstrated an understanding of the instructions.   The patient was advised to call back or seek an in-person evaluation if the symptoms worsen or if the condition fails to improve as anticipated.   BH MD OP Progress Note  12/27/2018 12:53 PM Nehemiah MassedKiawana D Sieben  MRN:  [id number]  Chief Complaint:  Chief Complaint    Follow-up     HPI: Robie RidgeKiawana is a 48 year old African-American female, married, unemployed, [place] in [place], has a history of bipolar disorder type II, panic disorder, insomnia, history of TIA was evaluated by telemedicine today.  A video call was attempted however due to connection problem it was changed to a phone call.  Patient today reports she is currently struggling with anger issues and crying spells.  She reports she does not feel too depressed and does not struggle with anhedonia or lack of motivation as she used to before. She also reports that she had a discussion with her therapist about her hallucinations.  She reports she did not think much about it and did not talk about this to Clinical research associatewriter however she was asked by her therapist to discuss this in session today.  Patient reports she has been seeing things out of the corner of her eyes as well as hearing doorbells as well as her name being called on a regular basis.  This has been going on since the past several months.  She does not know if it is a side  effect to phentermine-a weight loss medication however she reports she ran out of the medication and continues to have these kind of hallucinations.  Patient reports sleep is good.  [facility] and it does help.  Patient currently denies any suicidality, homicidality or perceptual disturbances.  Patient today also reports memory problems.  She does have a history of seizure-like spells in the past, TIA in the past.  She reports she has been struggling with short-term memory.  She reports she loses her train of thoughts when she is in the middle of a conversation.  She also reports she forgets what she is doing when she moves from one room to the next and this has been getting worse since the past several months.  She used to work with neurology in the past however reports she does not see anyone at this time.  Patient denies any other concerns today.  Visit Diagnosis: R/O Schizoaffective disorder   ICD-10-CM   1. Bipolar disorder, current episode mixed, severe, with psychotic features (HCC)  F31.64 ARIPiprazole (ABILIFY) 5 MG tablet   with psychosis, moderate  2. Panic disorder  F41.0   3. Insomnia due to mental condition  F51.05   4. Cognitive disorder  F09     Past Psychiatric History: I have reviewed past psychiatric history from my progress note on 04/18/2017.  Past Medical History:  Past Medical History:  Diagnosis Date  . Anemia   . Anxiety  Panic attacks  . Complication of anesthesia    pt reports "local" wears off quickly  . Depression   . Environmental allergies   . Hemorrhoids   . Leaky heart valve   . Sciatica 09/08/2017  . Wears contact lenses     Past Surgical History:  Procedure Laterality Date  . Caesaran section  1989  . COLONOSCOPY WITH PROPOFOL N/A 10/07/2014   Procedure: COLONOSCOPY WITH PROPOFOL;  Surgeon: Lucilla Lame, MD;  Location: [facility];  Service: Endoscopy;  Laterality: N/A;  Latex  . HERNIA REPAIR  1978    Family  Psychiatric History: Reviewed family psychiatric history from my progress note on 04/18/2017.  Family History:  Family History  Problem Relation Age of Onset  . Hypertension Mother   . Seizures Brother   . Pancreatic cancer Other     Social History: Reviewed social history from my progress note on 04/18/2017. Social History   Socioeconomic History  . Marital status: Married    Spouse name: duglaus Chittick  . Number of children: 5  . Years of education: 56  . Highest education level: Some college, no degree  Occupational History  . Occupation: [employer]  Social Needs  . Financial resource strain: Not very hard  . Food insecurity    Worry: Sometimes true    Inability: Sometimes true  . Transportation needs    Medical: No    Non-medical: No  Tobacco Use  . Smoking status: Former Smoker    Years: 15.00  . Smokeless tobacco: Never Used  Substance and Sexual Activity  . Alcohol use: Not Currently    Alcohol/week: 0.0 standard drinks    Comment: "socially"  . Drug use: No  . Sexual activity: Yes    Birth control/protection: I.U.D.    Comment: Paragard  Lifestyle  . Physical activity    Days per week: 0 days    Minutes per session: 0 min  . Stress: Rather much  Relationships  . Social Herbalist on phone: Not on file    Gets together: Not on file    Attends religious service: More than 4 times per year    Active member of club or organization: Yes    Attends meetings of clubs or organizations: More than 4 times per year    Relationship status: Married  Other Topics Concern  . Not on file  Social History Narrative   Lives at home w/ her husband and children   Left-handed   Drinks 1 cup of coffee per day    Allergies:  Allergies  Allergen Reactions  . Lithium     Face swelling   . Penicillins Other (See Comments)    Unknown reaction Has patient had a PCN reaction causing immediate rash, facial/tongue/throat swelling, SOB or lightheadedness with hypotension:  YES Has patient had a PCN reaction causing severe rash involving mucus membranes or skin necrosis: NO Has patient had a PCN reaction that required hospitalizationNO Has patient had a PCN reaction occurring within the last 10 years: NO If all of the above answers are "NO", then may proceed with Cephalosporin use.  . Shellfish Allergy Swelling    lips  . Latex Rash    Metabolic Disorder Labs: Lab Results  Component Value Date   HGBA1C 5.4 05/09/2015   MPG 108 05/09/2015   No results found for: PROLACTIN Lab Results  Component Value Date   CHOL 128 05/09/2015   TRIG 79 05/09/2015   HDL 41 05/09/2015  CHOLHDL 3.1 05/09/2015   VLDL 16 05/09/2015   LDLCALC 71 05/09/2015   Lab Results  Component Value Date   TSH 2.093 08/26/2017    Therapeutic Level Labs: No results found for: LITHIUM No results found for: VALPROATE No components found for:  CBMZ  Current Medications: Current Outpatient Medications  Medication Sig Dispense Refill  . ARIPiprazole (ABILIFY) 20 MG tablet Take 1 tablet (20 mg total) by mouth daily. 90 tablet 0  . ARIPiprazole (ABILIFY) 5 MG tablet Take 1 tablet (5 mg total) by mouth daily. To be combined with 20 mg 90 tablet 0  . aspirin 81 MG tablet Take 162 mg by mouth daily.    Marland Kitchen buPROPion (WELLBUTRIN XL) 150 MG 24 hr tablet TAKE 1 TABLET BY MOUTH EVERY DAY WITH BREAKFAST 90 tablet 1  . cetirizine-pseudoephedrine (ZYRTEC-D) 5-120 MG per tablet Take 1 tablet by mouth 2 (two) times daily as needed for allergies.    . Cholecalciferol (VITAMIN D3) 5000 units CAPS Take 1 capsule by mouth daily.    . eszopiclone (LUNESTA) 2 MG TABS tablet Take 1 tablet (2 mg total) by mouth at bedtime as needed for sleep. Take immediately before bedtime (Patient not taking: Reported on 11/27/2018) 30 tablet 1  . hydrOXYzine (VISTARIL) 25 MG capsule Take 1 capsule (25 mg total) by mouth 2 (two) times daily as needed. For anxiety attacks 60 capsule 1  . ibuprofen (ADVIL,MOTRIN) 800 MG  tablet Take 1 tablet (800 mg total) by mouth every 8 (eight) hours as needed for moderate pain. 15 tablet 0  . lamoTRIgine (LAMICTAL) 25 MG tablet Take 3 tablets (75 mg total) by mouth daily. 270 tablet 0  . PARAGARD INTRAUTERINE COPPER IUD IUD 1 each by Intrauterine route once.    . vitamin B-12 (CYANOCOBALAMIN) 1000 MCG tablet Take 1,000 mcg by mouth daily as needed (energy). Reported on 05/08/2015     No current facility-administered medications for this visit.      Musculoskeletal: Strength & Muscle Tone: UTA Gait & Station: Reports as WNL Patient leans: N/A  Psychiatric Specialty Exam: Review of Systems  Psychiatric/Behavioral: Positive for hallucinations and memory loss. The patient is nervous/anxious.   All other systems reviewed and are negative.   There were no vitals taken for this visit.There is no height or weight on file to calculate BMI.  General Appearance: UTA  Eye Contact:  UTA  Speech:  Clear and Coherent  Volume:  Normal  Mood:  Anxious  Affect:  UTA  Thought Process:  Goal Directed and Descriptions of Associations: Intact  Orientation:  Full (Time, Place, and Person)  Thought Content: Hallucinations: Auditory Visual reports seeing things from the corner of her eyes, hear door bells or some one calling her name  Suicidal Thoughts:  No  Homicidal Thoughts:  No  Memory:  Immediate;   Fair Recent;   Fair Remote;   Fair patient reports short term memory loss  Judgement:  Fair  Insight:  Fair  Psychomotor Activity:  UTA  Concentration:  Concentration: Fair and Attention Span: Fair  Recall:  [employer] of Knowledge: Fair  Language: Fair  Akathisia:  No  Handed:  Right  AIMS (if indicated): Denies tremors, rigidity  Assets:  Communication Skills Desire for Improvement Housing Social Support  ADL's:  Intact  Cognition:wnl  Sleep:  Fair   Screenings: PHQ2-9     Office Visit from 03/24/2018 in [facility] Office Visit from 09/09/2017 in  [facility]  Visit from 08/26/2017 in [facility]  PHQ-2 Total Score  0  4  6  PHQ-9 Total Score  -  15  21       Assessment and Plan: Christean is a 48 year old African-American female who has a history of bipolar disorder, panic attacks, insomnia, history of TIA, history of trauma was evaluated by telemedicine today.  She is biologically predisposed due to her multiple health problems.  She has psychosocial stressors of her health problems, current pandemic, financial problems.  Patient does have a history of noncompliance to medications as well as psychotherapy sessions however currently reports she has started psychotherapy sessions with Ms. Felecia Jan and is motivated to stay in therapy.  She also reports she has been more compliant with her medications however currently struggles with mood symptoms as well as perceptual disturbances and possible cognitive issues.  Patient will benefit from medication readjustment as well as referral to neurology for further evaluation of her cognitive problems.  Plan as noted below.  Plan  Bipolar disorder-unstable Increase Abilify to 25 mg p.o. daily Lamictal 75 mg p.o. daily Wellbutrin XL 150 mg p.o. daily.  Panic attacks-improving She will continue to work with Ms. Felecia Jan. She is also receiving acupuncture. Hydroxyzine 25 mg p.o. twice daily as needed  Insomnia-stable Lunesta 2 mg p.o. nightly Melatonin as needed.  Cognitive disorder-unspecified Patient reports she has been struggling with memory changes since the past several months and it is getting worse. Patient will benefit from neurology referral. Reviewed TSH labs-08/26/2017-2.093-within normal limits   Follow-up in clinic in 4 weeks or sooner if needed.  January 5 at 1 PM  I have spent atleast 15 minutes non face to face with patient today. More than 50 % of the time was spent for psychoeducation and supportive psychotherapy and care  coordination. This note was generated in part or whole with voice recognition software. Voice recognition is usually quite accurate but there are transcription errors that can and very often do occur. I apologize for any typographical errors that were not detected and corrected.         Jomarie Longs, MD 12/27/2018, 12:53 PM

## 2018-12-28 NOTE — Telephone Encounter (Signed)
AVS mailed

## 2019-01-22 DIAGNOSIS — R413 Other amnesia: Secondary | ICD-10-CM | POA: Insufficient documentation

## 2019-01-22 DIAGNOSIS — R569 Unspecified convulsions: Secondary | ICD-10-CM | POA: Insufficient documentation

## 2019-01-23 ENCOUNTER — Other Ambulatory Visit: Payer: Self-pay

## 2019-01-23 ENCOUNTER — Ambulatory Visit (INDEPENDENT_AMBULATORY_CARE_PROVIDER_SITE_OTHER): Payer: 59 | Admitting: Psychiatry

## 2019-01-23 ENCOUNTER — Encounter: Payer: Self-pay | Admitting: Psychiatry

## 2019-01-23 DIAGNOSIS — F3164 Bipolar disorder, current episode mixed, severe, with psychotic features: Secondary | ICD-10-CM | POA: Diagnosis not present

## 2019-01-23 DIAGNOSIS — F319 Bipolar disorder, unspecified: Secondary | ICD-10-CM | POA: Insufficient documentation

## 2019-01-23 DIAGNOSIS — F410 Panic disorder [episodic paroxysmal anxiety] without agoraphobia: Secondary | ICD-10-CM | POA: Diagnosis not present

## 2019-01-23 DIAGNOSIS — F5105 Insomnia due to other mental disorder: Secondary | ICD-10-CM | POA: Diagnosis not present

## 2019-01-23 DIAGNOSIS — F250 Schizoaffective disorder, bipolar type: Secondary | ICD-10-CM | POA: Insufficient documentation

## 2019-01-23 DIAGNOSIS — F09 Unspecified mental disorder due to known physiological condition: Secondary | ICD-10-CM

## 2019-01-23 MED ORDER — HYDROXYZINE PAMOATE 25 MG PO CAPS: 25.0000 mg | ORAL_CAPSULE | Freq: Two times a day (BID) | ORAL | 1 refills | Status: DC | PRN

## 2019-01-23 MED ORDER — ARIPIPRAZOLE 30 MG PO TABS: 30.0000 mg | ORAL_TABLET | Freq: Every day | ORAL | 0 refills | Status: DC

## 2019-01-23 MED ORDER — PRAZOSIN HCL 1 MG PO CAPS: 1.0000 mg | ORAL_CAPSULE | Freq: Every day | ORAL | 1 refills | Status: DC

## 2019-01-23 MED ORDER — ESZOPICLONE 2 MG PO TABS: 2.0000 mg | ORAL_TABLET | Freq: Every evening | ORAL | 1 refills | Status: DC | PRN

## 2019-01-23 NOTE — Progress Notes (Signed)
Virtual Visit via Video Note  I connected with Brittney Duran on 01/23/19 at  1:00 PM EST by a video enabled telemedicine application and verified that I am speaking with the correct person using two identifiers.   I discussed the limitations of evaluation and management by telemedicine and the availability of in person appointments. The patient expressed understanding and agreed to proceed.     I discussed the assessment and treatment plan with the patient. The patient was provided an opportunity to ask questions and all were answered. The patient agreed with the plan and demonstrated an understanding of the instructions.   The patient was advised to call back or seek an in-person evaluation if the symptoms worsen or if the condition fails to improve as anticipated.   [place] MD OP Progress Note  01/23/2019 3:26 PM Brittney Duran  MRN:  [id number]  Chief Complaint:  Chief Complaint    Follow-up     HPI: Brittney Duran is a 49 year old African-American female, married, unemployed, lives in [place], has a history of bipolar disorder , panic disorder, insomnia, history of TIA was evaluated by telemedicine today.  Patient today reports she continues to struggle with hallucinations.  She reports she hears her name being called, hear the doorbell often as well as sees things out of the corner of her eyes.  She reports it gets frustrating on and off.  Most of the time she is able to cope.  She does not know if the Abilify helps much with the same or not.  Patient also reports she has episodes of feeling anxious as well as depressive symptoms on and off.  She reports recently she had a panic attack where she had shortness of breath and racing heart rate.  Patient continues to make use of medications as needed like hydroxyzine which helps to some extent.  She also continues to work with her therapist and reports she is happy with the therapist and wants to continue the same.  She continues to have passive  suicidality on and off.  The last time she felt that way was few weeks ago.  She reports she felt hopeless however was able to cope with it.  Patient currently denies any active suicidal thoughts or plans.  She reports she recently went to the mountains with a friend and her friend brought it to her attention that she does snore a lot at night.  She reports her husband did mention this in the past however she did not believe him.  She had appointment with neurology recently and she is scheduled for sleep study as well as EEG.  She continues to struggle with memory problems and she is being worked up by neurology for the same as well.  Patient reports she has upcoming court hearing for her disability application.  She reports she had requested medical records from [place] however it has not been sent.  Discussed with patient that writer will transfer her call to our medical records contact person and she could discuss that with them.     Visit Diagnosis: R/O Schizoaffective disorder   ICD-10-CM   1. Bipolar disorder, current episode mixed, severe, with psychotic features ([place])  F31.64   2. Panic disorder  F41.0 prazosin (MINIPRESS) 1 MG capsule    hydrOXYzine (VISTARIL) 25 MG capsule  3. Insomnia due to mental condition  F51.05 prazosin (MINIPRESS) 1 MG capsule    ARIPiprazole (ABILIFY) 30 MG tablet    eszopiclone (LUNESTA) 2 MG TABS tablet  4. Cognitive disorder  F09     Past Psychiatric History: I have reviewed past psychiatric history from my progress note on 04/18/2017.  Past Medical History:  Past Medical History:  Diagnosis Date  . Anemia   . Anxiety    Panic attacks  . Complication of anesthesia    pt reports "local" wears off quickly  . Depression   . Environmental allergies   . Hemorrhoids   . Leaky heart valve   . Sciatica 09/08/2017  . Wears contact lenses     Past Surgical History:  Procedure Laterality Date  . Caesaran section  1989  . COLONOSCOPY WITH PROPOFOL N/A  10/07/2014   Procedure: COLONOSCOPY WITH PROPOFOL;  Surgeon: [name], MD;  [facility];  Service: Endoscopy;  Laterality: N/A;  Latex  . HERNIA REPAIR  1978    Family Psychiatric History: I have reviewed family psychiatric history from my progress note on 04/18/2017.  Family History:  Family History  Problem Relation Age of Onset  . Hypertension Mother   . Seizures Brother   . Pancreatic cancer Other     Social History: I have reviewed social history from my progress note on 04/18/2017. Social History   Socioeconomic History  . Marital status: Married    Spouse name: duglaus Dowell  . Number of children: 5  . Years of education: 60  . Highest education level: Some college, no degree  Occupational History  . Occupation: [employer]  Tobacco Use  . Smoking status: Former Smoker    Years: 15.00  . Smokeless tobacco: Never Used  Substance and Sexual Activity  . Alcohol use: Not Currently    Alcohol/week: 0.0 standard drinks    Comment: "socially"  . Drug use: No  . Sexual activity: Yes    Birth control/protection: I.U.D.    Comment: Paragard  Other Topics Concern  . Not on file  Social History Narrative   Lives at home w/ her husband and children   Left-handed   Drinks 1 cup of coffee per day   Social Determinants of Health   Financial Resource Strain: Low Risk   . Difficulty of Paying Living Expenses: Not very hard  Food Insecurity: Food Insecurity Present  . Worried About Programme researcher, broadcasting/film/video in the Last Year: Sometimes true  . Ran Out of Food in the Last Year: Sometimes true  Transportation Needs: No Transportation Needs  . Lack of Transportation (Medical): No  . Lack of Transportation (Non-Medical): No  Physical Activity: Inactive  . Days of Exercise per Week: 0 days  . Minutes of Exercise per Session: 0 min  Stress: Stress Concern Present  . Feeling of Stress : Rather much  Social Connections: Unknown  . Frequency of Communication with Friends  and Family: Not on file  . Frequency of Social Gatherings with Friends and Family: Not on file  . Attends Religious Services: More than 4 times per year  . Active Member of Clubs or Organizations: Yes  . Attends Banker Meetings: More than 4 times per year  . Marital Status: Married    Allergies:  Allergies  Allergen Reactions  . Lithium Swelling    Face swelling  Face swelling  . Other Swelling    lips  . Penicillins Other (See Comments)    Unknown reaction Has patient had a PCN reaction causing immediate rash, facial/tongue/throat swelling, SOB or lightheadedness with hypotension: YES Has patient had a PCN reaction causing severe rash involving mucus membranes or  skin necrosis: NO Has patient had a PCN reaction that required hospitalizationNO Has patient had a PCN reaction occurring within the last 10 years: NO If all of the above answers are "NO", then may proceed with Cephalosporin use.  . Shellfish Allergy Swelling    lips  . Latex Rash    Metabolic Disorder Labs: Lab Results  Component Value Date   HGBA1C 5.4 05/09/2015   MPG 108 05/09/2015   No results found for: PROLACTIN Lab Results  Component Value Date   CHOL 128 05/09/2015   TRIG 79 05/09/2015   HDL 41 05/09/2015   CHOLHDL 3.1 05/09/2015   VLDL 16 05/09/2015   LDLCALC 71 05/09/2015   Lab Results  Component Value Date   TSH 2.093 08/26/2017    Therapeutic Level Labs: No results found for: LITHIUM No results found for: VALPROATE No components found for:  CBMZ  Current Medications: Current Outpatient Medications  Medication Sig Dispense Refill  . ARIPiprazole (ABILIFY) 30 MG tablet Take 1 tablet (30 mg total) by mouth daily. 90 tablet 0  . aspirin 81 MG tablet Take 162 mg by mouth daily.    Marland Kitchen buPROPion (WELLBUTRIN XL) 150 MG 24 hr tablet TAKE 1 TABLET BY MOUTH EVERY DAY WITH BREAKFAST 90 tablet 1  . cetirizine-pseudoephedrine (ZYRTEC-D) 5-120 MG per tablet Take 1 tablet by mouth 2  (two) times daily as needed for allergies.    . Cholecalciferol (VITAMIN D3) 5000 units CAPS Take 1 capsule by mouth daily.    . eszopiclone (LUNESTA) 2 MG TABS tablet Take 1 tablet (2 mg total) by mouth at bedtime as needed for sleep. Take immediately before bedtime 30 tablet 1  . hydrOXYzine (VISTARIL) 25 MG capsule Take 1 capsule (25 mg total) by mouth 2 (two) times daily as needed. For anxiety attacks 60 capsule 1  . ibuprofen (ADVIL,MOTRIN) 800 MG tablet Take 1 tablet (800 mg total) by mouth every 8 (eight) hours as needed for moderate pain. 15 tablet 0  . lamoTRIgine (LAMICTAL) 25 MG tablet Take 3 tablets (75 mg total) by mouth daily. 270 tablet 0  . PARAGARD INTRAUTERINE COPPER IUD IUD 1 each by Intrauterine route once.    . prazosin (MINIPRESS) 1 MG capsule Take 1 capsule (1 mg total) by mouth at bedtime. Night mares 30 capsule 1  . vitamin B-12 (CYANOCOBALAMIN) 1000 MCG tablet Take 1,000 mcg by mouth daily as needed (energy). Reported on 05/08/2015     No current facility-administered medications for this visit.     Musculoskeletal: Strength & Muscle Tone: UTA Gait & Station: normal Patient leans: N/A  Psychiatric Specialty Exam: Review of Systems  Psychiatric/Behavioral: Positive for sleep disturbance. The patient is nervous/anxious.   All other systems reviewed and are negative.   There were no vitals taken for this visit.There is no height or weight on file to calculate BMI.  General Appearance: Casual  Eye Contact:  Fair  Speech:  Clear and Coherent  Volume:  Normal  Mood:  Anxious  Affect:  Congruent  Thought Process:  Goal Directed and Descriptions of Associations: Intact  Orientation:  Full (Time, Place, and Person)  Thought Content: Hallucinations: Auditory hear door bells and name being called as well as has visual hallucinations of seeing things out of the corner of her eyes  Suicidal Thoughts:  No  Homicidal Thoughts:  No  Memory:  Immediate;   Fair Recent;    Fair Remote;   Fair short term memory issues   Judgement:  Fair  Insight:  Fair  Psychomotor Activity:  Normal  Concentration:  Concentration: Fair and Attention Span: Fair  Recall:  [employer] of Knowledge: Fair  Language: Fair  Akathisia:  No  Handed:  Right  AIMS (if indicated): denies tremors, rigidity  Assets:  Communication Skills Desire for Improvement Housing Intimacy Social Support  ADL's:  Intact  Cognition: WNL  Sleep:  Poor Snores, kicking as well as has nightmares   Screenings: PHQ2-9     Office Visit from 03/24/2018 in [facility] Office Visit from 09/09/2017 in [facility], [facility] Office Visit from 08/26/2017 in [facility], [facility]  PHQ-2 Total Score  0  4  6  PHQ-9 Total Score  --  15  21       Assessment and Plan: Nyiesha is a 49 year old African-American female who has a history of bipolar disorder, panic attacks, insomnia, history of TIA, history of trauma, was evaluated by telemedicine today.  She is biologically predisposed due to her multiple health problems.  She also has psychosocial stressors of the current pandemic, financial problems.  Patient currently continues to struggle with mood symptoms, psychosis and cognitive issues.  She is currently working with neurology for her cognitive problems.  However patient will benefit from medication readjustment for her mood as well as her psychosis.  Plan as noted below.  Plan Bipolar disorder-some progress Increase Abilify to 30 mg p.o. daily for psychosis. If she continues to struggle then her Abilify may have to be changed to another antipsychotic medication. Continue Lamictal 75 mg p.o. daily Wellbutrin XL 150 mg p.o. daily  Panic attacks-improving. Continues to have breakthrough anxiety attacks however is making progress Continue CBT with Ms. Felecia Jan Hydroxyzine 25 mg p.o. twice daily as needed  Insomnia-restless Patient reports that sleep continues to be  restless again. She however has upcoming sleep study scheduled by her neurologist. We will start prazosin 1 mg p.o. nightly for nightmares. Provided medication education. Continue Lunesta 2 mg p.o. nightly.  Cognitive disorder-unspecified Patient is currently under the care of neurology. I have reviewed medical records in [facility] per neurology dated 01/22/2019-Dr. Hale Bogus, NP [place] ' we will send referral for sleep study.  Will order the following labs-vitamin B12, thiamine, vitamin D, folate, ferritin, CMP, A1c.  Will order routine EEG.'  We will coordinate care with Ms. Felecia Jan.  Follow-up in clinic in 3 to 4 weeks or sooner if needed.  January 26 at 1 PM  I have spent atleast 30 minutes non face to face with patient today. More than 50 % of the time was spent for psychoeducation and supportive psychotherapy and care coordination. This note was generated in part or whole with voice recognition software. Voice recognition is usually quite accurate but there are transcription errors that can and very often do occur. I apologize for any typographical errors that were not detected and corrected.       Jomarie Longs, MD 01/23/2019, 3:26 PM

## 2019-02-06 ENCOUNTER — Other Ambulatory Visit: Payer: 59

## 2019-02-13 ENCOUNTER — Ambulatory Visit: Payer: 59 | Admitting: Psychiatry

## 2019-02-14 ENCOUNTER — Other Ambulatory Visit: Payer: Self-pay

## 2019-02-14 ENCOUNTER — Encounter: Payer: Self-pay | Admitting: Psychiatry

## 2019-02-14 ENCOUNTER — Ambulatory Visit (INDEPENDENT_AMBULATORY_CARE_PROVIDER_SITE_OTHER): Payer: 59 | Admitting: Psychiatry

## 2019-02-14 DIAGNOSIS — F3164 Bipolar disorder, current episode mixed, severe, with psychotic features: Secondary | ICD-10-CM | POA: Diagnosis not present

## 2019-02-14 DIAGNOSIS — F410 Panic disorder [episodic paroxysmal anxiety] without agoraphobia: Secondary | ICD-10-CM | POA: Diagnosis not present

## 2019-02-14 DIAGNOSIS — F5105 Insomnia due to other mental disorder: Secondary | ICD-10-CM

## 2019-02-14 DIAGNOSIS — F09 Unspecified mental disorder due to known physiological condition: Secondary | ICD-10-CM

## 2019-02-14 MED ORDER — LAMOTRIGINE 25 MG PO TABS: 75.0000 mg | ORAL_TABLET | Freq: Every day | ORAL | 0 refills | Status: DC

## 2019-02-14 MED ORDER — ESZOPICLONE 3 MG PO TABS: 3.0000 mg | ORAL_TABLET | Freq: Every day | ORAL | 0 refills | Status: DC

## 2019-02-14 NOTE — Progress Notes (Signed)
Virtual Visit via Video Note  I connected with Brittney Duran on 02/14/19 at 10:40 AM EST by a video enabled telemedicine application and verified that I am speaking with the correct person using two identifiers.   I discussed the limitations of evaluation and management by telemedicine and the availability of in person appointments. The patient expressed understanding and agreed to proceed.    I discussed the assessment and treatment plan with the patient. The patient was provided an opportunity to ask questions and all were answered. The patient agreed with the plan and demonstrated an understanding of the instructions.   The patient was advised to call back or seek an in-person evaluation if the symptoms worsen or if the condition fails to improve as anticipated.   [facility] [facility] MD OP Progress Note  02/14/2019 11:40 AM Brittney Duran  MRN:  [id number]  Chief Complaint:  Chief Complaint    Follow-up     HPI: Brittney Duran is a 49 year old African-American female, married, unemployed, lives in [place], has a history of bipolar disorder, panic disorder, insomnia, history of TIA was evaluated by telemedicine today.  Patient today reports she is currently struggling with depressive symptoms.  She reports she had her court hearing for disability yesterday.  She reports it went well and she is currently waiting for the result.  She reports she had to go back into her past and talk about her story.  That was very distressing for her.  She reports today she feels drained out, tired and sad.  She had some difficulty getting herself out of the bed today.  She continues to see things out of the corner of her eyes.  She reports this has gotten worse the past 1 week.  She reports sleep continues to be restless.  The Lunesta helps her to sleep 2 to 3 hours only.  She however reports the nightmare medication has definitely helped with her nightmares and that is an improvement.  She is still waiting for her sleep  study, reports the insurance denied it.  She has reached out to her neurologist who is working on it.  Patient denies any suicidality, homicidality at this time.  Patient continues to report occasional memory problems.  She reports she has episodes when she kind of zones out.  She reports she was placing her clothes in her dresser recently and all of a sudden she did not know why she was doing it and felt that was not her dresser even though she has been using the same dresser for the past several years.  She reports it came back to her within a minute or 2 however it was scary during that time when she went through it.  She continues to work with neurology for her memory issues.  Patient reports she continues to work with Ms. Miguel Dibble and reports therapy sessions is going well.  Patient denies any other concerns today. Visit Diagnosis:    ICD-10-CM   1. Bipolar disorder, current episode mixed, severe, with psychotic features ([place])  F31.64 lamoTRIgine (LAMICTAL) 25 MG tablet  2. Panic disorder  F41.0   3. Insomnia due to mental condition  F51.05 Eszopiclone 3 MG TABS  4. Cognitive disorder  F09     Past Psychiatric History: I have reviewed past psychiatric history from my progress note on 04/18/2017.  Past Medical History:  Past Medical History:  Diagnosis Date  . Anemia   . Anxiety    Panic attacks  . Complication of anesthesia  pt reports "local" wears off quickly  . Depression   . Environmental allergies   . Hemorrhoids   . Leaky heart valve   . Sciatica 09/08/2017  . Wears contact lenses     Past Surgical History:  Procedure Laterality Date  . Caesaran section  1989  . COLONOSCOPY WITH PROPOFOL N/A 10/07/2014   Procedure: COLONOSCOPY WITH PROPOFOL;  Surgeon: [name], MD;  Location: [facility];  Service: Endoscopy;  Laterality: N/A;  Latex  . HERNIA REPAIR  1978    Family Psychiatric History: Reviewed family psychiatric history from my progress note on  04/18/2017.  Family History:  Family History  Problem Relation Age of Onset  . Hypertension Mother   . Seizures Brother   . Pancreatic cancer Other     Social History: I have reviewed social history from my progress note on 04/18/2017. Social History   Socioeconomic History  . Marital status: Married    Spouse name: duglaus Castagna  . Number of children: 5  . Years of education: 47  . Highest education level: Some college, no degree  Occupational History  . Occupation: [employer]  Tobacco Use  . Smoking status: Former Smoker    Years: 15.00  . Smokeless tobacco: Never Used  Substance and Sexual Activity  . Alcohol use: Not Currently    Alcohol/week: 0.0 standard drinks    Comment: "socially"  . Drug use: No  . Sexual activity: Yes    Birth control/protection: I.U.D.    Comment: Paragard  Other Topics Concern  . Not on file  Social History Narrative   Lives at home w/ her husband and children   Left-handed   Drinks 1 cup of coffee per day   Social Determinants of Health   Financial Resource Strain: Low Risk   . Difficulty of Paying Living Expenses: Not very hard  Food Insecurity: Food Insecurity Present  . Worried About Programme researcher, broadcasting/film/video in the Last Year: Sometimes true  . Ran Out of Food in the Last Year: Sometimes true  Transportation Needs: No Transportation Needs  . Lack of Transportation (Medical): No  . Lack of Transportation (Non-Medical): No  Physical Activity: Inactive  . Days of Exercise per Week: 0 days  . Minutes of Exercise per Session: 0 min  Stress: Stress Concern Present  . Feeling of Stress : Rather much  Social Connections: Unknown  . Frequency of Communication with Friends and Family: Not on file  . Frequency of Social Gatherings with Friends and Family: Not on file  . Attends Religious Services: More than 4 times per year  . Active Member of Clubs or Organizations: Yes  . Attends Banker Meetings: More than 4 times per year  .  Marital Status: Married    Allergies:  Allergies  Allergen Reactions  . Lithium Swelling    Face swelling  Face swelling  . Other Swelling    lips  . Penicillins Other (See Comments)    Unknown reaction Has patient had a PCN reaction causing immediate rash, facial/tongue/throat swelling, SOB or lightheadedness with hypotension: YES Has patient had a PCN reaction causing severe rash involving mucus membranes or skin necrosis: NO Has patient had a PCN reaction that required hospitalizationNO Has patient had a PCN reaction occurring within the last 10 years: NO If all of the above answers are "NO", then may proceed with Cephalosporin use.  . Shellfish Allergy Swelling    lips  . Latex Rash    Metabolic Disorder  Labs: Lab Results  Component Value Date   HGBA1C 5.4 05/09/2015   MPG 108 05/09/2015   No results found for: PROLACTIN Lab Results  Component Value Date   CHOL 128 05/09/2015   TRIG 79 05/09/2015   HDL 41 05/09/2015   CHOLHDL 3.1 05/09/2015   VLDL 16 05/09/2015   LDLCALC 71 05/09/2015   Lab Results  Component Value Date   TSH 2.093 08/26/2017    Therapeutic Level Labs: No results found for: LITHIUM No results found for: VALPROATE No components found for:  CBMZ  Current Medications: Current Outpatient Medications  Medication Sig Dispense Refill  . ARIPiprazole (ABILIFY) 30 MG tablet Take 1 tablet (30 mg total) by mouth daily. 90 tablet 0  . aspirin 81 MG tablet Take 162 mg by mouth daily.    Marland Kitchen buPROPion (WELLBUTRIN XL) 150 MG 24 hr tablet TAKE 1 TABLET BY MOUTH EVERY DAY WITH BREAKFAST 90 tablet 1  . cetirizine-pseudoephedrine (ZYRTEC-D) 5-120 MG per tablet Take 1 tablet by mouth 2 (two) times daily as needed for allergies.    . Cholecalciferol (VITAMIN D3) 5000 units CAPS Take 1 capsule by mouth daily.    . Eszopiclone 3 MG TABS Take 1 tablet (3 mg total) by mouth at bedtime. Take immediately before bedtime 30 tablet 0  . hydrOXYzine (VISTARIL) 25 MG  capsule Take 1 capsule (25 mg total) by mouth 2 (two) times daily as needed. For anxiety attacks 60 capsule 1  . ibuprofen (ADVIL,MOTRIN) 800 MG tablet Take 1 tablet (800 mg total) by mouth every 8 (eight) hours as needed for moderate pain. 15 tablet 0  . lamoTRIgine (LAMICTAL) 25 MG tablet Take 3 tablets (75 mg total) by mouth daily. 270 tablet 0  . PARAGARD INTRAUTERINE COPPER IUD IUD 1 each by Intrauterine route once.    . prazosin (MINIPRESS) 1 MG capsule Take 1 capsule (1 mg total) by mouth at bedtime. Night mares 30 capsule 1  . vitamin B-12 (CYANOCOBALAMIN) 1000 MCG tablet Take 1,000 mcg by mouth daily as needed (energy). Reported on 05/08/2015     No current facility-administered medications for this visit.     Musculoskeletal: Strength & Muscle Tone: UTA Gait & Station: normal Patient leans: N/A  Psychiatric Specialty Exam: Review of Systems  Psychiatric/Behavioral: Positive for dysphoric mood, hallucinations and sleep disturbance.  All other systems reviewed and are negative.   There were no vitals taken for this visit.There is no height or weight on file to calculate BMI.  General Appearance: Casual  Eye Contact:  Fair  Speech:  Normal Rate  Volume:  Normal  Mood:  Depressed  Affect:  Congruent  Thought Process:  Goal Directed and Descriptions of Associations: Intact  Orientation:  Full (Time, Place, and Person)  Thought Content: Hallucinations: Visual   Suicidal Thoughts:  No  Homicidal Thoughts:  No  Memory:  Immediate;   Fair Recent;   Fair Remote;   Fair occasional memory issues  Judgement:  Fair  Insight:  Fair  Psychomotor Activity:  Normal  Concentration:  Concentration: Fair and Attention Span: Fair  Recall:  [employer] of Knowledge: Fair  Language: Fair  Akathisia:  No  Handed:  Right  AIMS (if indicated):denies tremors, rigidity  Assets:  Communication Skills Desire for Improvement Housing Social Support  ADL's:  Intact  Cognition: WNL  Sleep:   Restless   Screenings: PHQ2-9     Office Visit from 03/24/2018 in [facility], [facility] Office Visit from 09/09/2017 in [facility], [facility] Office Visit from 08/26/2017 in [facility], [facility]  PHQ-2 Total Score  0  4  6  PHQ-9 Total Score  --  15  21       Assessment and Plan: Angelik is a 49 year old African-American female who has a history of bipolar disorder, panic attacks, insomnia, history of TIA, history of trauma was evaluated by telemedicine today.  She is biologically predisposed due to her multiple health problems.  She also has psychosocial stressors of current pandemic, financial problems as well as disability hearing.  Patient is currently struggling with depression, and sleep problems and cognitive issues and will continue to benefit from medication readjustment and psychotherapy sessions.  Plan Bipolar disorder-some progress Abilify 30 mg p.o. daily. Lamictal 75 mg p.o. daily Wellbutrin XL 150 mg p.o. daily  Panic attacks-some progress Continue CBT with Ms. Felecia Jan Hydroxyzine 25 mg p.o. twice daily as needed  Insomnia-restless Increase Lunesta to 3 mg p.o. nightly Prazosin 1 mg p.o. nightly for nightmares Sleep study pending-she will continue to work with neurology for the same.  Cognitive disorder-unspecified Patient continues to report memory issues-she will continue to work with neurology.  She had an EEG completed.  She also had several labs ordered including vitamin B12, thiamine, vitamin D, folate, ferritin, CMP, A1c.  Sleep study pending.  Patient to continue to work with Ms. Felecia Jan.  I have spent atleast 20 minutes non face to face with patient today. More than 50 % of the time was spent for  ordering medications and test ,psychoeducation and supportive psychotherapy and care coordination,as well as documenting clinical information in electronic health record. This note was generated in part or whole with voice recognition  software. Voice recognition is usually quite accurate but there are transcription errors that can and very often do occur. I apologize for any typographical errors that were not detected and corrected.          Jomarie Longs, MD 02/14/2019, 11:40 AM

## 2019-02-17 ENCOUNTER — Other Ambulatory Visit: Payer: Self-pay | Admitting: Psychiatry

## 2019-02-17 DIAGNOSIS — F5105 Insomnia due to other mental disorder: Secondary | ICD-10-CM

## 2019-02-17 DIAGNOSIS — F410 Panic disorder [episodic paroxysmal anxiety] without agoraphobia: Secondary | ICD-10-CM

## 2019-02-26 ENCOUNTER — Other Ambulatory Visit: Payer: Self-pay | Admitting: Psychiatry

## 2019-02-26 DIAGNOSIS — F3181 Bipolar II disorder: Secondary | ICD-10-CM

## 2019-02-26 DIAGNOSIS — F410 Panic disorder [episodic paroxysmal anxiety] without agoraphobia: Secondary | ICD-10-CM

## 2019-03-03 ENCOUNTER — Other Ambulatory Visit: Payer: Self-pay | Admitting: Psychiatry

## 2019-03-03 DIAGNOSIS — F3164 Bipolar disorder, current episode mixed, severe, with psychotic features: Secondary | ICD-10-CM

## 2019-03-03 DIAGNOSIS — F3181 Bipolar II disorder: Secondary | ICD-10-CM

## 2019-03-06 NOTE — Telephone Encounter (Signed)
Patient has enough refills for Lamictal as well as Wellbutrin.  Will not send supplies to pharmacy today.

## 2019-03-13 ENCOUNTER — Inpatient Hospital Stay: Payer: 59

## 2019-03-13 ENCOUNTER — Encounter: Payer: Self-pay | Admitting: Internal Medicine

## 2019-03-13 ENCOUNTER — Inpatient Hospital Stay: Payer: 59 | Attending: Internal Medicine | Admitting: Internal Medicine

## 2019-03-13 ENCOUNTER — Other Ambulatory Visit: Payer: Self-pay

## 2019-03-13 DIAGNOSIS — D509 Iron deficiency anemia, unspecified: Secondary | ICD-10-CM | POA: Diagnosis present

## 2019-03-13 DIAGNOSIS — Z79899 Other long term (current) drug therapy: Secondary | ICD-10-CM | POA: Diagnosis not present

## 2019-03-13 DIAGNOSIS — Z8673 Personal history of transient ischemic attack (TIA), and cerebral infarction without residual deficits: Secondary | ICD-10-CM | POA: Insufficient documentation

## 2019-03-13 DIAGNOSIS — N939 Abnormal uterine and vaginal bleeding, unspecified: Secondary | ICD-10-CM | POA: Diagnosis not present

## 2019-03-13 DIAGNOSIS — F418 Other specified anxiety disorders: Secondary | ICD-10-CM | POA: Insufficient documentation

## 2019-03-13 DIAGNOSIS — Z7982 Long term (current) use of aspirin: Secondary | ICD-10-CM | POA: Diagnosis not present

## 2019-03-13 DIAGNOSIS — Z87891 Personal history of nicotine dependence: Secondary | ICD-10-CM | POA: Diagnosis not present

## 2019-03-13 DIAGNOSIS — Z791 Long term (current) use of non-steroidal anti-inflammatories (NSAID): Secondary | ICD-10-CM | POA: Diagnosis not present

## 2019-03-13 DIAGNOSIS — E611 Iron deficiency: Secondary | ICD-10-CM

## 2019-03-13 DIAGNOSIS — R5383 Other fatigue: Secondary | ICD-10-CM | POA: Insufficient documentation

## 2019-03-13 DIAGNOSIS — R5381 Other malaise: Secondary | ICD-10-CM | POA: Diagnosis not present

## 2019-03-13 LAB — IRON AND TIBC
Iron: 120 ug/dL (ref 28–170)
Saturation Ratios: 34 % — ABNORMAL HIGH (ref 10.4–31.8)
TIBC: 356 ug/dL (ref 250–450)
UIBC: 236 ug/dL

## 2019-03-13 LAB — CBC WITH DIFFERENTIAL/PLATELET
Abs Immature Granulocytes: 0.04 10*3/uL (ref 0.00–0.07)
Basophils Absolute: 0 10*3/uL (ref 0.0–0.1)
Basophils Relative: 1 %
Eosinophils Absolute: 0.2 10*3/uL (ref 0.0–0.5)
Eosinophils Relative: 5 %
HCT: 35.6 % — ABNORMAL LOW (ref 36.0–46.0)
Hemoglobin: 11.2 g/dL — ABNORMAL LOW (ref 12.0–15.0)
Immature Granulocytes: 1 %
Lymphocytes Relative: 30 %
Lymphs Abs: 1.5 10*3/uL (ref 0.7–4.0)
MCH: 28.8 pg (ref 26.0–34.0)
MCHC: 31.5 g/dL (ref 30.0–36.0)
MCV: 91.5 fL (ref 80.0–100.0)
Monocytes Absolute: 0.3 10*3/uL (ref 0.1–1.0)
Monocytes Relative: 6 %
Neutro Abs: 2.8 10*3/uL (ref 1.7–7.7)
Neutrophils Relative %: 57 %
Platelets: 217 10*3/uL (ref 150–400)
RBC: 3.89 MIL/uL (ref 3.87–5.11)
RDW: 13.6 % (ref 11.5–15.5)
WBC: 4.8 10*3/uL (ref 4.0–10.5)
nRBC: 0 % (ref 0.0–0.2)

## 2019-03-13 LAB — LACTATE DEHYDROGENASE: LDH: 134 U/L (ref 98–192)

## 2019-03-13 LAB — RETIC PANEL
Immature Retic Fract: 11.7 % (ref 2.3–15.9)
RBC.: 3.95 MIL/uL (ref 3.87–5.11)
Retic Count, Absolute: 69.9 10*3/uL (ref 19.0–186.0)
Retic Ct Pct: 1.8 % (ref 0.4–3.1)
Reticulocyte Hemoglobin: 30.5 pg (ref >27.9)

## 2019-03-13 NOTE — Assessment & Plan Note (Addendum)
#   Anemia hemoglobin 11.3; FERRITIN-9 [[facility]]-patient is symptomatic.  Recommend proceeding with IV iron as patient is symptomatic.  Discussed the potential acute infusion reactions with IV iron; which are quite rare.  Patient understands the risk; will proceed with infusions.  # ? Etiology of anemia: Unclear-heavy menstrual cycles versus others.  Recommend GI evaluation for possible EGD.   # Family history of malignancy [stomach; pancreatic cancers]- will need further work up/genetic counseling down the line.  #Repeat CBC/LDH/reticulocyte panel; iron studies.  Thank you Dr. for allowing me to participate in the care of your pleasant patient. Please do not hesitate to contact me with questions or concerns in the interim.   # DISPOSITION: # labs today # refer to [facility] re: anemia # venofer weekly x3; start next week.  # follow up in 6 weeks- MD; labs- cbc/possible venofer-Dr.B

## 2019-03-13 NOTE — Progress Notes (Signed)
[facility] CONSULT NOTE  Patient Care Team: Lyndon Code, MD as PCP - General (Internal Medicine)  CHIEF COMPLAINTS/PURPOSE OF CONSULTATION:    HEMATOLOGY HISTORY  # ANEMIA colonoscopy-2016 [Dr.Wohl]; FERRITIN -9 [[facility]; [facility]]-hemoglobin 11.3  # ? Seizure [ Neurology; [facility]; [facility]]; history of stroke [followed by psychiatry]  HISTORY OF PRESENTING ILLNESS:  Brittney Duran 49 y.o.  female has been referred to [place] for further evaluation/work-up for anemia/ low iron.   Patient has a longstanding history of anemia; she has been on iron before.  Not recently on iron.  She was referred to neurology for work-up of possible seizures.  The process noted to have low ferritin.  Referred to [place] for further evaluation/and recommendations.  Patient has moderate to severe fatigue.  Blood in stools: None Change in bowel habits- None Blood in urine: None Difficulty swallowing: None Abnormal weight loss: None Iron supplementation: Currently on PO iron Prior Blood transfusions: none  Vaginal bleeding: Has a history of heavy menstrual cycles.  Reviewed the records at length-office note/labs from referral providers office- dated 2/16 summarized above.   Review of Systems  Constitutional: Positive for malaise/fatigue. Negative for chills, diaphoresis, fever and weight loss.  HENT: Negative for nosebleeds and sore throat.   Eyes: Negative for double vision.  Respiratory: Negative for cough, hemoptysis, sputum production, shortness of breath and wheezing.   Cardiovascular: Negative for chest pain, palpitations, orthopnea and leg swelling.  Gastrointestinal: Negative for abdominal pain, blood in stool, constipation, diarrhea, heartburn, melena, nausea and vomiting.  Genitourinary: Negative for dysuria, frequency and urgency.  Musculoskeletal: Positive for joint pain. Negative for back pain.  Skin: Negative.  Negative for itching and rash.  Neurological: Negative for dizziness,  tingling, focal weakness, weakness and headaches.  Endo/Heme/Allergies: Does not bruise/bleed easily.  Psychiatric/Behavioral: Negative for depression. The patient is not nervous/anxious and does not have insomnia.     MEDICAL HISTORY:  Past Medical History:  Diagnosis Date  . Anemia   . Anxiety    Panic attacks  . Complication of anesthesia    pt reports "local" wears off quickly  . Depression   . Environmental allergies   . Hemorrhoids   . Leaky heart valve   . Sciatica 09/08/2017  . Wears contact lenses     SURGICAL HISTORY: Past Surgical History:  Procedure Laterality Date  . Caesaran section  1989  . COLONOSCOPY WITH PROPOFOL N/A 10/07/2014   Procedure: COLONOSCOPY WITH PROPOFOL;  Surgeon: [name], MD;  Location: [facility];  Service: Endoscopy;  Laterality: N/A;  Latex  . HERNIA REPAIR  1978    SOCIAL HISTORY: Social History   Socioeconomic History  . Marital status: Married    Spouse name: duglaus Raver  . Number of children: 5  . Years of education: 57  . Highest education level: Some college, no degree  Occupational History  . Occupation: [employer]  Tobacco Use  . Smoking status: Former Smoker    Years: 15.00  . Smokeless tobacco: Never Used  Substance and Sexual Activity  . Alcohol use: Not Currently    Alcohol/week: 0.0 standard drinks    Comment: "socially"  . Drug use: No  . Sexual activity: Yes    Birth control/protection: I.U.D.    Comment: Paragard  Other Topics Concern  . Not on file  Social History Narrative   Lives at home w/ her husband and children; Left-handed; Drinks 1 cup of coffee per day. Lives in [place]; used to work for  [employer]/ awaiting for disability [sec to stroke]; no smoking; rare alcohol/wine.    Social Determinants of Health   Financial Resource Strain: Low Risk   . Difficulty of Paying Living Expenses: Not very hard  Food Insecurity: Food Insecurity Present  . Worried About Charity fundraiser in the Last  Year: Sometimes true  . Ran Out of Food in the Last Year: Sometimes true  Transportation Needs: No Transportation Needs  . Lack of Transportation (Medical): No  . Lack of Transportation (Non-Medical): No  Physical Activity: Inactive  . Days of Exercise per Week: 0 days  . Minutes of Exercise per Session: 0 min  Stress: Stress Concern Present  . Feeling of Stress : Rather much  Social Connections: Unknown  . Frequency of Communication with Friends and Family: Not on file  . Frequency of Social Gatherings with Friends and Family: Not on file  . Attends Religious Services: More than 4 times per year  . Active Member of Clubs or Organizations: Yes  . Attends Archivist Meetings: More than 4 times per year  . Marital Status: Married  Human resources officer Violence: Not At Risk  . Fear of Current or Ex-Partner: No  . Emotionally Abused: No  . Physically Abused: No  . Sexually Abused: No    FAMILY HISTORY: Family History  Problem Relation Age of Onset  . Hypertension Mother   . Seizures Brother   . Pancreatic cancer Other        pat- aunt.     ALLERGIES:  is allergic to lithium; other; penicillins; shellfish allergy; and latex.  MEDICATIONS:  Current Outpatient Medications  Medication Sig Dispense Refill  . ARIPiprazole (ABILIFY) 30 MG tablet Take 1 tablet (30 mg total) by mouth daily. 90 tablet 0  . aspirin 81 MG tablet Take 162 mg by mouth daily.    Marland Kitchen buPROPion (WELLBUTRIN XL) 150 MG 24 hr tablet TAKE 1 TABLET BY MOUTH EVERY DAY WITH BREAKFAST 90 tablet 1  . cetirizine-pseudoephedrine (ZYRTEC-D) 5-120 MG per tablet Take 1 tablet by mouth 2 (two) times daily as needed for allergies.    . Cholecalciferol (VITAMIN D3) 5000 units CAPS Take 1 capsule by mouth daily.    . Eszopiclone 3 MG TABS Take 1 tablet (3 mg total) by mouth at bedtime. Take immediately before bedtime 30 tablet 0  . ferrous sulfate 325 (65 FE) MG tablet Take 325 mg by mouth daily with breakfast.    .  hydrOXYzine (VISTARIL) 25 MG capsule Take 1 capsule (25 mg total) by mouth 2 (two) times daily as needed. For anxiety attacks 60 capsule 1  . ibuprofen (ADVIL,MOTRIN) 800 MG tablet Take 1 tablet (800 mg total) by mouth every 8 (eight) hours as needed for moderate pain. 15 tablet 0  . lamoTRIgine (LAMICTAL) 25 MG tablet Take 3 tablets (75 mg total) by mouth daily. 270 tablet 0  . PARAGARD INTRAUTERINE COPPER IUD IUD 1 each by Intrauterine route once.    . prazosin (MINIPRESS) 1 MG capsule TAKE 1 CAPSULE (1 MG TOTAL) BY MOUTH AT BEDTIME. NIGHT MARES 90 capsule 1  . vitamin B-12 (CYANOCOBALAMIN) 1000 MCG tablet Take 1,000 mcg by mouth daily as needed (energy). Reported on 05/08/2015     No current facility-administered medications for this visit.      PHYSICAL EXAMINATION:   There were no vitals filed for this visit. There were no vitals filed for this visit.  Physical Exam  Constitutional: She is oriented to person, place, and  time and well-developed, well-nourished, and in no distress.  HENT:  Head: Normocephalic and atraumatic.  Mouth/Throat: Oropharynx is clear and moist. No oropharyngeal exudate.  Eyes: Pupils are equal, round, and reactive to light.  Cardiovascular: Normal rate and regular rhythm.  Pulmonary/Chest: No respiratory distress. She has no wheezes.  Abdominal: Soft. Bowel sounds are normal. She exhibits no distension and no mass. There is no abdominal tenderness. There is no rebound and no guarding.  Musculoskeletal:        General: No tenderness or edema. Normal range of motion.     Cervical back: Normal range of motion and neck supple.  Neurological: She is alert and oriented to person, place, and time.  Skin: Skin is warm.  Psychiatric: Affect normal.    LABORATORY DATA:  I have reviewed the data as listed Lab Results  Component Value Date   WBC 4.8 03/13/2019   HGB 11.2 (L) 03/13/2019   HCT 35.6 (L) 03/13/2019   MCV 91.5 03/13/2019   PLT 217 03/13/2019    Recent Labs    12/14/18 0159  NA 139  K 4.4  CL 109  CO2 22  GLUCOSE 104*  BUN 16  CREATININE 0.86  CALCIUM 9.7  GFRNONAA >60  GFRAA >60     No results found.  Iron deficiency # Anemia hemoglobin 11.3; FERRITIN-9 [[facility]]-patient is symptomatic.  Recommend proceeding with IV iron as patient is symptomatic.  Discussed the potential acute infusion reactions with IV iron; which are quite rare.  Patient understands the risk; will proceed with infusions.  # ? Etiology of anemia: Unclear-heavy menstrual cycles versus others.  Recommend GI evaluation for possible EGD.   # Family history of malignancy [stomach; pancreatic cancers]- will need further work up/genetic counseling down the line.  #Repeat CBC/LDH/reticulocyte panel; iron studies.  Thank you Dr. for allowing me to participate in the care of your pleasant patient. Please do not hesitate to contact me with questions or concerns in the interim.   # DISPOSITION: # labs today # refer to [facility] re: anemia # venofer weekly x3; start next week.  # follow up in 6 weeks- MD; labs- cbc/possible venofer-Dr.B  All questions were answered. The patient knows to call the clinic with any problems, questions or concerns.    Earna Coder, MD 03/14/2019 8:12 AM

## 2019-03-14 ENCOUNTER — Encounter: Payer: Self-pay | Admitting: Psychiatry

## 2019-03-14 ENCOUNTER — Ambulatory Visit (INDEPENDENT_AMBULATORY_CARE_PROVIDER_SITE_OTHER): Payer: 59 | Admitting: Psychiatry

## 2019-03-14 DIAGNOSIS — F5105 Insomnia due to other mental disorder: Secondary | ICD-10-CM | POA: Diagnosis not present

## 2019-03-14 DIAGNOSIS — F410 Panic disorder [episodic paroxysmal anxiety] without agoraphobia: Secondary | ICD-10-CM

## 2019-03-14 DIAGNOSIS — F09 Unspecified mental disorder due to known physiological condition: Secondary | ICD-10-CM | POA: Diagnosis not present

## 2019-03-14 DIAGNOSIS — F3164 Bipolar disorder, current episode mixed, severe, with psychotic features: Secondary | ICD-10-CM | POA: Diagnosis not present

## 2019-03-14 NOTE — Progress Notes (Signed)
Provider Location : [facility] Patient Location : Home  Virtual Visit via Video Note  I connected with Brittney Duran on 03/14/19 at 10:00 AM EST by a video enabled telemedicine application and verified that I am speaking with the correct person using two identifiers.   I discussed the limitations of evaluation and management by telemedicine and the availability of in person appointments. The patient expressed understanding and agreed to proceed.     I discussed the assessment and treatment plan with the patient. The patient was provided an opportunity to ask questions and all were answered. The patient agreed with the plan and demonstrated an understanding of the instructions.   The patient was advised to call back or seek an in-person evaluation if the symptoms worsen or if the condition fails to improve as anticipated. [facility] MD OP Progress Note  03/14/2019 12:04 PM [name]  MRN:  [id number]  Chief Complaint:  Chief Complaint    Follow-up     HPI: Brittney Duran is a 49 year old African-American female, married, unemployed, lives in [place] has a history of bipolar disorder, panic disorder, insomnia, history of TIA was evaluated by telemedicine today.  Patient today reports she is anxious about her current situational stressors.  She reports she is awaiting her disability hearing result and has not heard back anything from her attorney yet.  That does make her nervous.  Patient however reports she is better able to cope with her symptoms at this time.  She however had an episode the beginning of February.  She reports she had suicidal thoughts and had the urge to crash her car.  She however reports she was able to talk to her husband as well as her therapist.  She reports she took her medications and went to sleep and woke up feeling better the next day.  She reports her therapist have advised her to call her even after hours if she has any problems and she plans to do so.  Patient currently  denies any suicidality, homicidality.  Patient did not report any perceptual disturbances.  Patient reports she is currently being investigated for possible low iron as well as may need further evaluation by gastroenterologist.  She does report restlessness at night however reports her sleep study came back negative.  She continues to struggle with memory problems although she reports they are improving compared to how it was few weeks ago.  She had EEG done which came back negative however is scheduled for a 72-hour EEG.  She looks forward to that.  Patient reports she wants to stay on her medications as it is and does not want to make any changes today.  She reports she is interested in intensive programs however is unable to afford them.    Visit Diagnosis:    ICD-10-CM   1. Bipolar disorder, current episode mixed, severe, with psychotic features (HCC)  F31.64   2. Panic disorder  F41.0   3. Insomnia due to mental condition  F51.05   4. Cognitive disorder  F09     Past Psychiatric History: I have reviewed past psychiatric history from my progress note on 04/18/2017.  Past Medical History:  Past Medical History:  Diagnosis Date  . Anemia   . Anxiety    Panic attacks  . Complication of anesthesia    pt reports "local" wears off quickly  . Depression   . Environmental allergies   . Hemorrhoids   . Leaky heart valve   . Sciatica 09/08/2017  .  Wears contact lenses     Past Surgical History:  Procedure Laterality Date  . Caesaran section  1989  . COLONOSCOPY WITH PROPOFOL N/A 10/07/2014   Procedure: COLONOSCOPY WITH PROPOFOL;  Surgeon: [name], MD;  Location: [facility];  Service: Endoscopy;  Laterality: N/A;  Latex  . HERNIA REPAIR  1978    Family Psychiatric History: Reviewed family psychiatric history from my progress note on 04/18/2017.  Family History:  Family History  Problem Relation Age of Onset  . Hypertension Mother   . Seizures Brother   . Pancreatic  cancer Other        pat- aunt.     Social History: Reviewed social history from my progress note on 04/18/2017. Social History   Socioeconomic History  . Marital status: Married    Spouse name: duglaus Tetzloff  . Number of children: 5  . Years of education: 14  . Highest education level: Some college, no degree  Occupational History  . Occupation: [employer]  Tobacco Use  . Smoking status: Former Smoker    Years: 15.00  . Smokeless tobacco: Never Used  Substance and Sexual Activity  . Alcohol use: Not Currently    Alcohol/week: 0.0 standard drinks    Comment: "socially"  . Drug use: No  . Sexual activity: Yes    Birth control/protection: I.U.D.    Comment: Paragard  Other Topics Concern  . Not on file  Social History Narrative   Lives at home w/ her husband and children; Left-handed; Drinks 1 cup of coffee per day. Lives in [place]; used to work for [employer]/ awaiting for disability [sec to stroke]; no smoking; rare alcohol/wine.    Social Determinants of Health   Financial Resource Strain: Low Risk   . Difficulty of Paying Living Expenses: Not very hard  Food Insecurity: Food Insecurity Present  . Worried About Programme researcher, broadcasting/film/video in the Last Year: Sometimes true  . Ran Out of Food in the Last Year: Sometimes true  Transportation Needs: No Transportation Needs  . Lack of Transportation (Medical): No  . Lack of Transportation (Non-Medical): No  Physical Activity: Inactive  . Days of Exercise per Week: 0 days  . Minutes of Exercise per Session: 0 min  Stress: Stress Concern Present  . Feeling of Stress : Rather much  Social Connections: Unknown  . Frequency of Communication with Friends and Family: Not on file  . Frequency of Social Gatherings with Friends and Family: Not on file  . Attends Religious Services: More than 4 times per year  . Active Member of Clubs or Organizations: Yes  . Attends Banker Meetings: More than 4 times per year  . Marital Status:  Married    Allergies:  Allergies  Allergen Reactions  . Lithium Swelling    Face swelling  Face swelling  . Other Swelling    lips  . Penicillins Other (See Comments)    Unknown reaction Has patient had a PCN reaction causing immediate rash, facial/tongue/throat swelling, SOB or lightheadedness with hypotension: YES Has patient had a PCN reaction causing severe rash involving mucus membranes or skin necrosis: NO Has patient had a PCN reaction that required hospitalizationNO Has patient had a PCN reaction occurring within the last 10 years: NO If all of the above answers are "NO", then may proceed with Cephalosporin use.  . Shellfish Allergy Swelling    lips  . Latex Rash    Metabolic Disorder Labs: Lab Results  Component Value Date  HGBA1C 5.4 05/09/2015   MPG 108 05/09/2015   No results found for: PROLACTIN Lab Results  Component Value Date   CHOL 128 05/09/2015   TRIG 79 05/09/2015   HDL 41 05/09/2015   CHOLHDL 3.1 05/09/2015   VLDL 16 05/09/2015   LDLCALC 71 05/09/2015   Lab Results  Component Value Date   TSH 2.093 08/26/2017    Therapeutic Level Labs: No results found for: LITHIUM No results found for: VALPROATE No components found for:  CBMZ  Current Medications: Current Outpatient Medications  Medication Sig Dispense Refill  . ARIPiprazole (ABILIFY) 30 MG tablet Take 1 tablet (30 mg total) by mouth daily. 90 tablet 0  . aspirin 81 MG tablet Take 162 mg by mouth daily.    Marland Kitchen buPROPion (WELLBUTRIN XL) 150 MG 24 hr tablet TAKE 1 TABLET BY MOUTH EVERY DAY WITH BREAKFAST 90 tablet 1  . cetirizine-pseudoephedrine (ZYRTEC-D) 5-120 MG per tablet Take 1 tablet by mouth 2 (two) times daily as needed for allergies.    . Cholecalciferol (VITAMIN D3) 5000 units CAPS Take 1 capsule by mouth daily.    . Eszopiclone 3 MG TABS Take 1 tablet (3 mg total) by mouth at bedtime. Take immediately before bedtime 30 tablet 0  . ferrous sulfate 325 (65 FE) MG tablet Take 325 mg  by mouth daily with breakfast.    . hydrOXYzine (VISTARIL) 25 MG capsule Take 1 capsule (25 mg total) by mouth 2 (two) times daily as needed. For anxiety attacks 60 capsule 1  . ibuprofen (ADVIL,MOTRIN) 800 MG tablet Take 1 tablet (800 mg total) by mouth every 8 (eight) hours as needed for moderate pain. 15 tablet 0  . lamoTRIgine (LAMICTAL) 25 MG tablet Take 3 tablets (75 mg total) by mouth daily. 270 tablet 0  . PARAGARD INTRAUTERINE COPPER IUD IUD 1 each by Intrauterine route once.    . prazosin (MINIPRESS) 1 MG capsule TAKE 1 CAPSULE (1 MG TOTAL) BY MOUTH AT BEDTIME. NIGHT MARES 90 capsule 1  . vitamin B-12 (CYANOCOBALAMIN) 1000 MCG tablet Take 1,000 mcg by mouth daily as needed (energy). Reported on 05/08/2015     No current facility-administered medications for this visit.     Musculoskeletal: Strength & Muscle Tone: UTA Gait & Station: normal Patient leans: N/A  Psychiatric Specialty Exam: Review of Systems  Psychiatric/Behavioral: Positive for sleep disturbance. The patient is nervous/anxious.   All other systems reviewed and are negative.   There were no vitals taken for this visit.There is no height or weight on file to calculate BMI.  General Appearance: Casual  Eye Contact:  Fair  Speech:  Normal Rate  Volume:  Normal  Mood:  Anxious  Affect:  Congruent  Thought Process:  Goal Directed and Descriptions of Associations: Intact  Orientation:  Full (Time, Place, and Person)  Thought Content: Logical   Suicidal Thoughts:  No  Homicidal Thoughts:  No  Memory:  Immediate;   Fair Recent;   Fair Remote;   Fair  Judgement:  Fair  Insight:  Fair  Psychomotor Activity:  Normal  Concentration:  Concentration: Fair and Attention Span: Fair  Recall:  [employer] of Knowledge: Fair  Language: Fair  Akathisia:  No  Handed:  Right  AIMS (if indicated): UTA  Assets:  Communication Skills Desire for Improvement Housing Social Support  ADL's:  Intact  Cognition: WNL  Sleep:   Restless   Screenings: PHQ2-9     [facility] Visit from 03/24/2018 in [facility], [facility]  Office Visit from 09/09/2017 in [facility], [facility] Office Visit from 08/26/2017 in [facility], [facility]  PHQ-2 Total Score  0  4  6  PHQ-9 Total Score  --  15  21       Assessment and Plan:Amillia is a 49 year old African-American female who has a history of bipolar disorder, panic attacks, insomnia, history of TIA, history of trauma was evaluated by telemedicine today.  She is biologically predisposed due to her multiple health problems.  She also has psychosocial stressors of current pandemic, financial problems as well as awaiting her disability hearing result.  Patient continues to struggle with multiple health issues and is currently being evaluated for the same.  She does have a history of chronic suicidality however currently denies any active thoughts or plan.  Patient however due to her frequent suicidal thoughts as well as multiple psychosocial stressors may benefit from more intensive outpatient program or partial hospitalization program.  This was discussed with patient however patient reports she is not able to afford it at this time.  Discussed plan as noted below.  Plan Bipolar disorder-some progress Abilify 30 mg p.o. daily Lamotrigine 75 mg p.o. daily Wellbutrin XL 150 mg p.o. daily  Panic attacks-some progress Continue CBT with Ms. Felecia Jan Hydroxyzine 25 mg p.o. twice daily as needed  Insomnia-restless Lunesta 3 mg p.o. nightly Prazosin 1 mg p.o. nightly for nightmares Patient reports she had sleep study done-it came back within normal limits. Discussed adding a medication for restless leg symptoms-patient declines. Patient has upcoming appointment with hematology/oncology for iron abnormalities.  Cognitive disorder-unspecified Patient will continue to follow-up with neurology. She is scheduled for 72-hour EEG.  Patient advised to follow-up with  Inetta Fermo for therapy.  I have provided her information for [facility]-walk-in, mobile crisis, intensive outpatient program.  Since patient is unable to afford the IOP with [place] advised patient to contact [facility] to see if that is more affordable for her since due to her chronic mood swings, several psychosocial stressors she may benefit from a higher level of care.  She agrees to look into it.  Crisis plan discussed with patient.  Provided her information for suicide hotline.  We will coordinate care with Ms. Felecia Jan.  Follow-up in clinic in 2 to 3 weeks or sooner if needed.  March 16 at 3:30 PM  I have spent atleast 20 minutes non face to face with patient today. More than 50 % of the time was spent for preparing to see the patient ( e.g., review of test, records ), obtaining and to review and separately obtained history , ordering medications and test ,psychoeducation and supportive psychotherapy and care coordination,as well as documenting clinical information in electronic health record. This note was generated in part or whole with voice recognition software. Voice recognition is usually quite accurate but there are transcription errors that can and very often do occur. I apologize for any typographical errors that were not detected and corrected.       Jomarie Longs, MD 03/14/2019, 12:04 PM

## 2019-03-19 ENCOUNTER — Other Ambulatory Visit: Payer: Self-pay | Admitting: Internal Medicine

## 2019-03-20 ENCOUNTER — Other Ambulatory Visit: Payer: Self-pay | Admitting: Internal Medicine

## 2019-03-21 ENCOUNTER — Encounter: Payer: Self-pay | Admitting: Gastroenterology

## 2019-03-22 ENCOUNTER — Other Ambulatory Visit: Payer: Self-pay

## 2019-03-22 ENCOUNTER — Encounter: Payer: Self-pay | Admitting: Gastroenterology

## 2019-03-22 ENCOUNTER — Ambulatory Visit (INDEPENDENT_AMBULATORY_CARE_PROVIDER_SITE_OTHER): Payer: 59 | Admitting: Gastroenterology

## 2019-03-22 ENCOUNTER — Inpatient Hospital Stay: Payer: 59 | Attending: Internal Medicine

## 2019-03-22 VITALS — BP 115/72 | HR 77 | Resp 18

## 2019-03-22 DIAGNOSIS — D509 Iron deficiency anemia, unspecified: Secondary | ICD-10-CM

## 2019-03-22 DIAGNOSIS — D508 Other iron deficiency anemias: Secondary | ICD-10-CM | POA: Insufficient documentation

## 2019-03-22 DIAGNOSIS — E611 Iron deficiency: Secondary | ICD-10-CM

## 2019-03-22 MED ORDER — SODIUM CHLORIDE 0.9 % IV SOLN
Freq: Once | INTRAVENOUS | Status: AC
  Filled 2019-03-22: qty 250

## 2019-03-22 MED ORDER — SODIUM CHLORIDE 0.9 % IV SOLN: 200.0000 mg | Freq: Once | INTRAVENOUS | Status: DC

## 2019-03-22 MED ORDER — NA SULFATE-K SULFATE-MG SULF 17.5-3.13-1.6 GM/177ML PO SOLN: 354.0000 mL | Freq: Once | ORAL | 0 refills | Status: AC

## 2019-03-22 MED ORDER — IRON SUCROSE 20 MG/ML IV SOLN
200.0000 mg | Freq: Once | INTRAVENOUS | Status: AC
  Administered 2019-03-22: 200 mg via INTRAVENOUS
  Filled 2019-03-22: qty 10

## 2019-03-22 NOTE — Progress Notes (Signed)
Brittney Duran [address]  [address]  [place], [place] [address]  Main: [phone]  Fax: [phone]   Gastroenterology Consultation  Referring Provider:     Earna Coder, * Primary Care Physician:  Lyndon Code, MD Reason for Consultation:     Iron Def anemia        HPI:   Virtual Visit via Video Note  I connected with patient on 03/22/19 at 10:30 AM EST by video ([email or web address]) and verified that I am speaking with the correct person using two identifiers.   I discussed the limitations, risks, security and privacy concerns of performing an evaluation and management service by video and the availability of in person appointments. I also discussed with the patient that there may be a patient responsible charge related to this service. The patient expressed understanding and agreed to proceed.  Location of the patient: Home Location of provider: Home Participating persons: Patient and provider only (Nursing staff checked in patient via phone but were not physically involved in the video interaction - see their notes)   History of Present Illness: Chief Complaint  Patient presents with  . New Patient (Initial Visit)  . Anemia    Patient has been fatigue. Has had some nausea off and on.     Brittney Duran is a 49 y.o. y/o female referred for consultation & management  by Dr. Welton Flakes, Shannan Harper, MD.  Patient referred for iron deficiency anemia.  Seeing hematology for iron replacement.  Reports heavy periods, that lasts 7 days.  Goes through four pads during the day, and more overnight and weeks to them.  Has seen a GYN for this and has an IUD.  Denies any episodes of GI bleeding.  No family history of colon cancer.  Has family history of pancreatic cancer and other cancers for which Dr. B is considering genetic counseling.  No dysphagia, nausea vomiting, altered bowel habits.  Past Medical History:  Diagnosis Date  . Anemia   . Anxiety    Panic attacks  .  Complication of anesthesia    pt reports "local" wears off quickly  . Depression   . Environmental allergies   . Hemorrhoids   . Leaky heart valve   . Sciatica 09/08/2017  . Wears contact lenses     Past Surgical History:  Procedure Laterality Date  . Caesaran section  1989  . COLONOSCOPY WITH PROPOFOL N/A 10/07/2014   Procedure: COLONOSCOPY WITH PROPOFOL;  Surgeon: [name], MD;  Location: [facility];  Service: Endoscopy;  Laterality: N/A;  Latex  . HERNIA REPAIR  1978    Prior to Admission medications   Medication Sig Start Date End Date Taking? Authorizing Provider  ARIPiprazole (ABILIFY) 30 MG tablet Take 1 tablet (30 mg total) by mouth daily. 01/23/19  Yes Jomarie Longs, MD  aspirin 81 MG tablet Take 162 mg by mouth daily.   Yes [provider]  buPROPion (WELLBUTRIN XL) 150 MG 24 hr tablet TAKE 1 TABLET BY MOUTH EVERY DAY WITH BREAKFAST 11/07/18  Yes Eappen, Levin Bacon, MD  cetirizine-pseudoephedrine (ZYRTEC-D) 5-120 MG per tablet Take 1 tablet by mouth 2 (two) times daily as needed for allergies.   Yes [provider]  Cholecalciferol (VITAMIN D3) 5000 units CAPS Take 1 capsule by mouth daily.   Yes [provider]  Eszopiclone 3 MG TABS Take 1 tablet (3 mg total) by mouth at bedtime. Take immediately before bedtime 02/14/19  Yes Jomarie Longs, MD  ferrous  sulfate 325 (65 FE) MG tablet Take 325 mg by mouth daily with breakfast.   Yes [provider]  hydrOXYzine (VISTARIL) 25 MG capsule Take 1 capsule (25 mg total) by mouth 2 (two) times daily as needed. For anxiety attacks 01/23/19  Yes Eappen, Ria Clock, MD  ibuprofen (ADVIL,MOTRIN) 800 MG tablet Take 1 tablet (800 mg total) by mouth every 8 (eight) hours as needed for moderate pain. 09/09/17  Yes Paulette Blanch, MD  lamoTRIgine (LAMICTAL) 25 MG tablet Take 3 tablets (75 mg total) by mouth daily. 02/14/19  Yes Ursula Alert, MD  PARAGARD INTRAUTERINE COPPER IUD IUD 1 each by Intrauterine  route once.   Yes [provider]  prazosin (MINIPRESS) 1 MG capsule TAKE 1 CAPSULE (1 MG TOTAL) BY MOUTH AT BEDTIME. NIGHT MARES 02/19/19  Yes Eappen, Ria Clock, MD  vitamin B-12 (CYANOCOBALAMIN) 1000 MCG tablet Take 1,000 mcg by mouth daily as needed (energy). Reported on 05/08/2015   Yes [provider]    Family History  Problem Relation Age of Onset  . Hypertension Mother   . Seizures Brother   . Pancreatic cancer Other        pat- aunt.      Social History   Tobacco Use  . Smoking status: Former Smoker    Years: 15.00  . Smokeless tobacco: Never Used  Substance Use Topics  . Alcohol use: Not Currently    Alcohol/week: 0.0 standard drinks    Comment: OCC will drink wine   . Drug use: No    Allergies as of 03/22/2019 - Review Complete 03/22/2019  Allergen Reaction Noted  . Lithium Swelling 08/26/2017  . Other Swelling 10/03/2014  . Penicillins Other (See Comments) 05/27/2014  . Shellfish allergy Swelling 10/03/2014  . Latex Rash 05/27/2014    Review of Systems:    All systems reviewed and negative except where noted in HPI.   Observations/Objective:  Labs: CBC    Component Value Date/Time   WBC 4.8 03/13/2019 1542   RBC 3.95 03/13/2019 1542   RBC 3.89 03/13/2019 1542   HGB 11.2 (L) 03/13/2019 1542   HGB 10.4 (L) 10/08/2012 1105   HCT 35.6 (L) 03/13/2019 1542   HCT 26.7 (L) 10/09/2012 0430   PLT 217 03/13/2019 1542   PLT 195 10/08/2012 1105   MCV 91.5 03/13/2019 1542   MCV 83 10/08/2012 1105   MCH 28.8 03/13/2019 1542   MCHC 31.5 03/13/2019 1542   RDW 13.6 03/13/2019 1542   RDW 15.5 (H) 10/08/2012 1105   LYMPHSABS 1.5 03/13/2019 1542   LYMPHSABS 1.3 10/08/2012 1105   MONOABS 0.3 03/13/2019 1542   MONOABS 0.6 10/08/2012 1105   EOSABS 0.2 03/13/2019 1542   EOSABS 0.0 10/08/2012 1105   BASOSABS 0.0 03/13/2019 1542   BASOSABS 0.0 10/08/2012 1105   CMP     Component Value Date/Time   NA 139 12/14/2018 0159   NA 138 10/07/2012 0216    K 4.4 12/14/2018 0159   K 3.6 10/07/2012 0216   CL 109 12/14/2018 0159   CL 108 (H) 10/07/2012 0216   CO2 22 12/14/2018 0159   CO2 21 10/07/2012 0216   GLUCOSE 104 (H) 12/14/2018 0159   GLUCOSE 98 10/07/2012 0216   BUN 16 12/14/2018 0159   BUN 8 10/07/2012 0216   CREATININE 0.86 12/14/2018 0159   CREATININE 0.54 (L) 10/07/2012 0216   CALCIUM 9.7 12/14/2018 0159   CALCIUM 9.2 10/07/2012 0216   PROT 7.2 08/26/2017 1211   ALBUMIN 4.1 08/26/2017  1211   AST 17 08/26/2017 1211   AST 21 10/07/2012 0216   ALT 11 08/26/2017 1211   ALKPHOS 47 08/26/2017 1211   BILITOT 1.1 08/26/2017 1211   GFRNONAA >60 12/14/2018 0159   GFRNONAA >60 10/07/2012 0216   GFRAA >60 12/14/2018 0159   GFRAA >60 10/07/2012 0216    Imaging Studies: No results found.  Assessment and Plan:   SIDDHI DORNBUSH is a 49 y.o. y/o female has been referred for iron deficiency anemia  Assessment and Plan: Unclear if her menses are causing her iron deficiency given that she has had GYN work-up for this, has an IUD, and is only using four pads during the day, but more during the night.  She is of the age where screening colonoscopy is due, and would also benefit from the same for iron deficiency anemia.  EGD also indicated for the same.  Discussed pill camera study if EGD and colonoscopy are negative.  I have discussed alternative options, risks & benefits,  which include, but are not limited to, bleeding, infection, perforation,respiratory complication & drug reaction.  The patient agrees with this plan & written consent will be obtained.    In addition the above, the risks of small bowel capsule getting stuck in the GI tract were discussed. Need for surgery for removal of the capsule, if this occurs were discussed as well. This usually occurs if a stricture, mass or abnormality is present in the small bowel. Pt verbalized understanding and is agreeable to proceed with small bowel capsule study.    Follow Up  Instructions:   I discussed the assessment and treatment plan with the patient. The patient was provided an opportunity to ask questions and all were answered. The patient agreed with the plan and demonstrated an understanding of the instructions.   The patient was advised to call back or seek an in-person evaluation if the symptoms worsen or if the condition fails to improve as anticipated.  I provided 15 minutes of face-to-face time via video software during this encounter.  Additional time was spent in reviewing patient's chart, placing orders etc.   Pasty Spillers, MD  Speech recognition software was used to dictate the above note.

## 2019-03-22 NOTE — Addendum Note (Signed)
Addended by: Radene Knee L on: 03/22/2019 11:10 AM   Modules accepted: Orders

## 2019-03-29 ENCOUNTER — Inpatient Hospital Stay: Payer: 59

## 2019-03-29 VITALS — BP 106/68 | HR 75 | Temp 98.2°F | Resp 16 | Wt 221.4 lb

## 2019-03-29 DIAGNOSIS — E611 Iron deficiency: Secondary | ICD-10-CM

## 2019-03-29 DIAGNOSIS — D508 Other iron deficiency anemias: Secondary | ICD-10-CM | POA: Diagnosis not present

## 2019-03-29 MED ORDER — SODIUM CHLORIDE 0.9 % IV SOLN
Freq: Once | INTRAVENOUS | Status: AC
  Filled 2019-03-29: qty 250

## 2019-03-29 MED ORDER — IRON SUCROSE 20 MG/ML IV SOLN
200.0000 mg | Freq: Once | INTRAVENOUS | Status: AC
  Administered 2019-03-29: 200 mg via INTRAVENOUS
  Filled 2019-03-29: qty 10

## 2019-04-03 ENCOUNTER — Ambulatory Visit (INDEPENDENT_AMBULATORY_CARE_PROVIDER_SITE_OTHER): Payer: 59 | Admitting: Psychiatry

## 2019-04-03 ENCOUNTER — Other Ambulatory Visit: Payer: Self-pay

## 2019-04-03 ENCOUNTER — Encounter: Payer: Self-pay | Admitting: Psychiatry

## 2019-04-03 DIAGNOSIS — F09 Unspecified mental disorder due to known physiological condition: Secondary | ICD-10-CM

## 2019-04-03 DIAGNOSIS — F3177 Bipolar disorder, in partial remission, most recent episode mixed: Secondary | ICD-10-CM

## 2019-04-03 DIAGNOSIS — F5105 Insomnia due to other mental disorder: Secondary | ICD-10-CM | POA: Diagnosis not present

## 2019-04-03 DIAGNOSIS — F410 Panic disorder [episodic paroxysmal anxiety] without agoraphobia: Secondary | ICD-10-CM

## 2019-04-03 MED ORDER — ESZOPICLONE 3 MG PO TABS: 3.0000 mg | ORAL_TABLET | Freq: Every day | ORAL | 2 refills | Status: DC

## 2019-04-03 MED ORDER — ARIPIPRAZOLE 30 MG PO TABS: 30.0000 mg | ORAL_TABLET | Freq: Every day | ORAL | 0 refills | Status: DC

## 2019-04-03 NOTE — Progress Notes (Signed)
Provider Location : [facility] Patient Location : Home  Virtual Visit via Video Note  I connected with Brittney Duran on 04/03/19 at  3:30 PM EDT by a video enabled telemedicine application and verified that I am speaking with the correct person using two identifiers.   I discussed the limitations of evaluation and management by telemedicine and the availability of in person appointments. The patient expressed understanding and agreed to proceed.    I discussed the assessment and treatment plan with the patient. The patient was provided an opportunity to ask questions and all were answered. The patient agreed with the plan and demonstrated an understanding of the instructions.   The patient was advised to call back or seek an in-person evaluation if the symptoms worsen or if the condition fails to improve as anticipated.   [facility] MD OP Progress Note  04/03/2019 5:33 PM Brittney Duran  MRN:  [id number]  Chief Complaint:  Chief Complaint    Follow-up     HPI: Brittney Duran is a 49 year old African-American female, married, unemployed, applied for disability-pending, lives in [place], has a history of bipolar disorder, panic disorder, insomnia, history of TIA was evaluated by telemedicine today.  Patient today reports she is currently dealing with a lot of psychosocial stressors of health issues.  She is currently getting iron infusions for her low hemoglobin and iron level.  She reports she is also scheduled for a colonoscopy by her gastroenterologist in May.  She reports the iron infusion did help her to some extent initially then she started feeling fatigued again.  She reports she was told by her providers that it will take a few infusions before she will start feeling better.  She reports mood wise she is currently making progress.  She reports she is more aware about her problems and is mindful about her being very absent-minded.  She hence tries to bring her attention back to the present moment  and is currently taking one day at a time.  She reports the therapy sessions is very beneficial.  She reports her anxiety and depressive symptoms have improved on the current medication regimen and therapy.  She reports sleep as restless on and off.  She however has noticed her nightmares and acting out have improved.  She takes the [place] most nights.  She also is taking the prazosin and is tolerating it well.  She reports sleep overall is improved on this combination.  She reports her panic attacks have improved on the last time she had a full-blown panic attack was several months ago.  Patient denies any suicidality, homicidality.  Patient does report seeing shadows on and off however that has been getting better.  Patient has started exercising at the [facility] with a friend, does it 3 times a week.  That also has helped her to feel more motivated.  Patient denies any other concerns today.   Visit Diagnosis:    ICD-10-CM   1. Bipolar disorder, in partial remission, most recent episode mixed (HCC)  F31.77   2. Panic disorder  F41.0   3. Insomnia due to mental condition  F51.05 ARIPiprazole (ABILIFY) 30 MG tablet    Eszopiclone 3 MG TABS  4. Cognitive disorder  F09     Past Psychiatric History: I have reviewed past psychiatric history from my progress note on 04/18/2017.  Past Medical History:  Past Medical History:  Diagnosis Date  . Anemia   . Anxiety    Panic attacks  . Complication of anesthesia  pt reports "local" wears off quickly  . Depression   . Environmental allergies   . Hemorrhoids   . Leaky heart valve   . Sciatica 09/08/2017  . Wears contact lenses     Past Surgical History:  Procedure Laterality Date  . Caesaran section  1989  . COLONOSCOPY WITH PROPOFOL N/A 10/07/2014   Procedure: COLONOSCOPY WITH PROPOFOL;  Surgeon: Lucilla Lame, MD;  Location: [facility];  Service: Endoscopy;  Laterality: N/A;  Latex  . HERNIA REPAIR  1978    Family Psychiatric  History: Reviewed family psychiatric history from my progress note on 04/18/2017  Family History:  Family History  Problem Relation Age of Onset  . Hypertension Mother   . Seizures Brother   . Pancreatic cancer Other        pat- aunt.     Social History: Reviewed social history from my progress note on 04/18/2017 Social History   Socioeconomic History  . Marital status: Married    Spouse name: duglaus Mccollam  . Number of children: 5  . Years of education: 57  . Highest education level: Some college, no degree  Occupational History  . Occupation: [employer]  Tobacco Use  . Smoking status: Former Smoker    Years: 15.00  . Smokeless tobacco: Never Used  Substance and Sexual Activity  . Alcohol use: Not Currently    Alcohol/week: 0.0 standard drinks    Comment: OCC will drink wine   . Drug use: No  . Sexual activity: Yes    Birth control/protection: I.U.D.    Comment: Paragard  Other Topics Concern  . Not on file  Social History Narrative   Lives at home w/ her husband and children; Left-handed; Drinks 1 cup of coffee per day. Lives in [place]; used to work for [employer]/ awaiting for disability [sec to stroke]; no smoking; rare alcohol/wine.    Social Determinants of Health   Financial Resource Strain: Low Risk   . Difficulty of Paying Living Expenses: Not very hard  Food Insecurity: Food Insecurity Present  . Worried About Charity fundraiser in the Last Year: Sometimes true  . Ran Out of Food in the Last Year: Sometimes true  Transportation Needs: No Transportation Needs  . Lack of Transportation (Medical): No  . Lack of Transportation (Non-Medical): No  Physical Activity: Inactive  . Days of Exercise per Week: 0 days  . Minutes of Exercise per Session: 0 min  Stress: Stress Concern Present  . Feeling of Stress : Rather much  Social Connections: Unknown  . Frequency of Communication with Friends and Family: Not on file  . Frequency of Social Gatherings with Friends and  Family: Not on file  . Attends Religious Services: More than 4 times per year  . Active Member of Clubs or Organizations: Yes  . Attends Archivist Meetings: More than 4 times per year  . Marital Status: Married    Allergies:  Allergies  Allergen Reactions  . Lithium Swelling    Face swelling  Face swelling  . Other Swelling    lips  . Penicillins Other (See Comments)    Unknown reaction Has patient had a PCN reaction causing immediate rash, facial/tongue/throat swelling, SOB or lightheadedness with hypotension: YES Has patient had a PCN reaction causing severe rash involving mucus membranes or skin necrosis: NO Has patient had a PCN reaction that required hospitalizationNO Has patient had a PCN reaction occurring within the last 10 years: NO If all of the above answers  are "NO", then may proceed with Cephalosporin use.  . Shellfish Allergy Swelling    lips  . Latex Rash    Metabolic Disorder Labs: Lab Results  Component Value Date   HGBA1C 5.4 05/09/2015   MPG 108 05/09/2015   No results found for: PROLACTIN Lab Results  Component Value Date   CHOL 128 05/09/2015   TRIG 79 05/09/2015   HDL 41 05/09/2015   CHOLHDL 3.1 05/09/2015   VLDL 16 05/09/2015   LDLCALC 71 05/09/2015   Lab Results  Component Value Date   TSH 2.093 08/26/2017    Therapeutic Level Labs: No results found for: LITHIUM No results found for: VALPROATE No components found for:  CBMZ  Current Medications: Current Outpatient Medications  Medication Sig Dispense Refill  . ARIPiprazole (ABILIFY) 30 MG tablet Take 1 tablet (30 mg total) by mouth daily. 90 tablet 0  . aspirin 81 MG tablet Take 162 mg by mouth daily.    Marland Kitchen buPROPion (WELLBUTRIN XL) 150 MG 24 hr tablet TAKE 1 TABLET BY MOUTH EVERY DAY WITH BREAKFAST 90 tablet 1  . cetirizine-pseudoephedrine (ZYRTEC-D) 5-120 MG per tablet Take 1 tablet by mouth 2 (two) times daily as needed for allergies.    . Cholecalciferol (VITAMIN D3)  5000 units CAPS Take 1 capsule by mouth daily.    . Eszopiclone 3 MG TABS Take 1 tablet (3 mg total) by mouth at bedtime. Take immediately before bedtime 30 tablet 2  . ferrous sulfate 325 (65 FE) MG tablet Take 325 mg by mouth daily with breakfast.    . hydrOXYzine (VISTARIL) 25 MG capsule Take 1 capsule (25 mg total) by mouth 2 (two) times daily as needed. For anxiety attacks 60 capsule 1  . ibuprofen (ADVIL,MOTRIN) 800 MG tablet Take 1 tablet (800 mg total) by mouth every 8 (eight) hours as needed for moderate pain. 15 tablet 0  . lamoTRIgine (LAMICTAL) 25 MG tablet Take 3 tablets (75 mg total) by mouth daily. 270 tablet 0  . PARAGARD INTRAUTERINE COPPER IUD IUD 1 each by Intrauterine route once.    . prazosin (MINIPRESS) 1 MG capsule TAKE 1 CAPSULE (1 MG TOTAL) BY MOUTH AT BEDTIME. NIGHT MARES 90 capsule 1  . SUPREP BOWEL PREP KIT 17.5-3.13-1.6 GM/177ML SOLN     . vitamin B-12 (CYANOCOBALAMIN) 1000 MCG tablet Take 1,000 mcg by mouth daily as needed (energy). Reported on 05/08/2015     No current facility-administered medications for this visit.     Musculoskeletal: Strength & Muscle Tone: UTA Gait & Station: normal Patient leans: N/A  Psychiatric Specialty Exam: Review of Systems  Psychiatric/Behavioral: Positive for dysphoric mood. The patient is nervous/anxious.   All other systems reviewed and are negative.   There were no vitals taken for this visit.There is no height or weight on file to calculate BMI.  General Appearance: Casual  Eye Contact:  Fair  Speech:  Normal Rate  Volume:  Normal  Mood:  Anxious and Depressed improving  Affect:  Appropriate  Thought Process:  Goal Directed and Descriptions of Associations: Intact  Orientation:  Full (Time, Place, and Person)  Thought Content: Logical   Suicidal Thoughts:  No  Homicidal Thoughts:  No  Memory:  Immediate;   Fair Recent;   Fair Remote;   Fair  Judgement:  Fair  Insight:  Fair  Psychomotor Activity:  Normal   Concentration:  Concentration: Fair and Attention Span: Good  Recall:  [employer] of Knowledge: Fair  Language: Fair  Akathisia:  No  Handed:  Right  AIMS (if indicated): UTA  Assets:  Communication Skills Desire for Improvement Housing Social Support  ADL's:  Intact  Cognition: WNL  Sleep:  Improving   Screenings: PHQ2-9     Office Visit from 03/24/2018 in [facility], [facility] Office Visit from 09/09/2017 in [facility], [facility] Office Visit from 08/26/2017 in [facility], [facility]  PHQ-2 Total Score  0  4  6  PHQ-9 Total Score  --  15  21       Assessment and Plan:Brittney Duran is a 50 year old African-American female who has a history of bipolar disorder, panic attacks, insomnia, history of trauma was evaluated by telemedicine today.  She is biologically predisposed due to her multiple health problems.  She also has psychosocial stressors of the pandemic, financial problems as well as pending disability hearing report.  Patient is currently making progress although she continues to struggle with mild mood lability as well as perceptual disturbances.  Patient currently denies any suicidality and does report improvement on the current medication regimen.  Plan as noted below.  Plan Bipolar disorder-in partial remission Abilify 30 mg p.o. daily Lamotrigine 75 mg p.o. daily Wellbutrin XL 150 mg p.o. daily  Panic attacks-improving Continue CBT with Ms. Miguel Dibble Hydroxyzine 25 mg p.o. twice daily as needed  Insomnia-improving Sleep study came back within normal limits. I have reviewed medical records in [facility] per neurology-Ashley Stepp, NP-dated 03/27/2019-polysomnography within normal limits. Lunesta 3 mg p.o. nightly Prazosin 1 mg p.o. nightly for nightmares  Cognitive disorder-unspecified She will continue to follow-up with neurology.  Follow-up in clinic in 4 weeks or sooner if needed.  I have spent atleast 20 minutes non face to face with patient  today. More than 50 % of the time was spent for preparing to see the patient ( e.g., review of test, records ), obtaining and to review and separately obtained history , ordering medications and test ,psychoeducation and supportive psychotherapy and care coordination,as well as documenting clinical information in electronic health record. This note was generated in part or whole with voice recognition software. Voice recognition is usually quite accurate but there are transcription errors that can and very often do occur. I apologize for any typographical errors that were not detected and corrected.        Ursula Alert, MD 04/03/2019, 5:33 PM

## 2019-04-05 ENCOUNTER — Inpatient Hospital Stay: Payer: 59

## 2019-04-05 VITALS — BP 105/60 | HR 63 | Temp 97.3°F | Resp 18

## 2019-04-05 DIAGNOSIS — D508 Other iron deficiency anemias: Secondary | ICD-10-CM | POA: Diagnosis not present

## 2019-04-05 DIAGNOSIS — E611 Iron deficiency: Secondary | ICD-10-CM

## 2019-04-05 MED ORDER — IRON SUCROSE 20 MG/ML IV SOLN
200.0000 mg | Freq: Once | INTRAVENOUS | Status: AC
  Administered 2019-04-05: 200 mg via INTRAVENOUS
  Filled 2019-04-05: qty 10

## 2019-04-05 MED ORDER — SODIUM CHLORIDE 0.9 % IV SOLN
Freq: Once | INTRAVENOUS | Status: AC
  Filled 2019-04-05: qty 250

## 2019-04-05 NOTE — Progress Notes (Signed)
Pt tolerated infusion well. Pt and VS stable at discharge.  

## 2019-04-30 ENCOUNTER — Encounter: Payer: Self-pay | Admitting: Psychiatry

## 2019-04-30 ENCOUNTER — Ambulatory Visit (INDEPENDENT_AMBULATORY_CARE_PROVIDER_SITE_OTHER): Payer: 59 | Admitting: Psychiatry

## 2019-04-30 ENCOUNTER — Other Ambulatory Visit: Payer: Self-pay

## 2019-04-30 DIAGNOSIS — G4701 Insomnia due to medical condition: Secondary | ICD-10-CM

## 2019-04-30 DIAGNOSIS — F09 Unspecified mental disorder due to known physiological condition: Secondary | ICD-10-CM

## 2019-04-30 DIAGNOSIS — F410 Panic disorder [episodic paroxysmal anxiety] without agoraphobia: Secondary | ICD-10-CM

## 2019-04-30 DIAGNOSIS — F3177 Bipolar disorder, in partial remission, most recent episode mixed: Secondary | ICD-10-CM

## 2019-04-30 DIAGNOSIS — G2111 Neuroleptic induced parkinsonism: Secondary | ICD-10-CM | POA: Diagnosis not present

## 2019-04-30 MED ORDER — LAMOTRIGINE 25 MG PO TABS: 75.0000 mg | ORAL_TABLET | Freq: Every day | ORAL | 0 refills | Status: DC

## 2019-04-30 MED ORDER — BUPROPION HCL ER (XL) 150 MG PO TB24: ORAL_TABLET | ORAL | 0 refills | Status: DC

## 2019-04-30 MED ORDER — BENZTROPINE MESYLATE 0.5 MG PO TABS: 0.5000 mg | ORAL_TABLET | Freq: Every day | ORAL | 1 refills | Status: DC

## 2019-04-30 NOTE — Progress Notes (Signed)
Provider Location : [facility] Patient Location : [facility] Visit via Video Note  I connected with Brittney Duran on 04/30/19 at  3:30 PM EDT by a video enabled telemedicine application and verified that I am speaking with the correct person using two identifiers.   I discussed the limitations of evaluation and management by telemedicine and the availability of in person appointments. The patient expressed understanding and agreed to proceed.   I discussed the assessment and treatment plan with the patient. The patient was provided an opportunity to ask questions and all were answered. The patient agreed with the plan and demonstrated an understanding of the instructions.   The patient was advised to call back or seek an in-person evaluation if the symptoms worsen or if the condition fails to improve as anticipated.  [facility] MD OP Progress Note  04/30/2019 5:15 PM Brittney Duran  MRN:  [id number]  Chief Complaint:  Chief Complaint    Follow-up     [id number] is a 49 year old African-American female, married, unemployed, has applied for disability, lives in [place], has a history of bipolar disorder, panic attacks, insomnia, history of TIA was evaluated by telemedicine today.  Patient today reports that she completely forgot about this appointment.  She reports she was driving to the nearest pharmacy when she got writer's call.  Part of the session hence was done by a phone call since patient was having connection problems.  Patient today reports mood wise she is doing okay.  She has made some progress with regards to her depression and anxiety.  She reports there are days when she has low energy and lack of motivation and sadness however that has improved and she is able to manage those days better than before.  She continues to struggle with restlessness at night.  She reports she has restlessness of her lower extremities.  Patient had sleep study done which did not show any restless leg  symptoms.   Patient continues to be compliant on her medications.  Discussed with patient that Abilify could be causing the restlessness.  Patient is also getting infusions for low iron level which when corrected will also help with her restlessness.  Patient denies any suicidality, homicidality at this time.  She reports she currently does not have any auditory hallucination however does see images or shadows out of the corner of her eyes on and off.  However that has improved.  She continues to follow-up with her therapist Ms. Miguel Dibble.  Patient denies any other concerns today. Visit Diagnosis:    ICD-10-CM   1. Bipolar disorder, in partial remission, most recent episode mixed (HCC)  F31.77   2. Panic disorder  F41.0 buPROPion (WELLBUTRIN XL) 150 MG 24 hr tablet  3. 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 Cognitive disorder  F09     Past Psychiatric History: I have reviewed past psychiatric history from my progress note on 04/18/2017.  Past Medical History:  Past Medical History:  Diagnosis Date  . Anemia   . Anxiety    Panic attacks  . Complication of anesthesia    pt reports "local" wears off quickly  . Depression   . Environmental allergies   . Hemorrhoids   . Leaky heart valve   . Sciatica 09/08/2017  . Wears contact lenses     Past Surgical History:  Procedure Laterality Date  .  Caesaran section  1989  . COLONOSCOPY WITH PROPOFOL N/A 10/07/2014   Procedure: COLONOSCOPY WITH PROPOFOL;  Surgeon: Lucilla Lame, MD;  Location: [facility];  Service: Endoscopy;  Laterality: N/A;  Latex  . HERNIA REPAIR  1978    Family Psychiatric History: I have reviewed family psychiatric history from my progress note on 04/18/2017.  Family History:  Family History  Problem Relation Age of Onset  . Hypertension Mother   . Seizures Brother   .  Pancreatic cancer Other        pat- aunt.     Social History: I have reviewed social history from my progress note on 04/18/2017. Social History   Socioeconomic History  . Marital status: Married    Spouse name: duglaus Cage  . Number of children: 5  . Years of education: 12  . Highest education level: Some college, no degree  Occupational History  . Occupation: [employer]  Tobacco Use  . Smoking status: Former Smoker    Years: 15.00  . Smokeless tobacco: Never Used  Substance and Sexual Activity  . Alcohol use: Not Currently    Alcohol/week: 0.0 standard drinks    Comment: OCC will drink wine   . Drug use: No  . Sexual activity: Yes    Birth control/protection: I.U.D.    Comment: Paragard  Other Topics Concern  . Not on file  Social History Narrative   Lives at home w/ her husband and children; Left-handed; Drinks 1 cup of coffee per day. Lives in [place]; used to work for [employer]/ awaiting for disability [sec to stroke]; no smoking; rare alcohol/wine.    Social Determinants of Health   Financial Resource Strain:   . Difficulty of Paying Living Expenses:   Food Insecurity:   . Worried About Charity fundraiser in the Last Year:   . Arboriculturist in the Last Year:   Transportation Needs:   . Film/video editor (Medical):   Marland Kitchen Lack of Transportation (Non-Medical):   Physical Activity:   . Days of Exercise per Week:   . Minutes of Exercise per Session:   Stress:   . Feeling of Stress :   Social Connections:   . Frequency of Communication with Friends and Family:   . Frequency of Social Gatherings with Friends and Family:   . Attends Religious Services:   . Active Member of Clubs or Organizations:   . Attends Archivist Meetings:   Marland Kitchen Marital Status:     Allergies:  Allergies  Allergen Reactions  . Lithium Swelling    Face swelling  Face swelling  . Other Swelling    lips  . Penicillins Other (See Comments)    Unknown reaction Has patient had  a PCN reaction causing immediate rash, facial/tongue/throat swelling, SOB or lightheadedness with hypotension: YES Has patient had a PCN reaction causing severe rash involving mucus membranes or skin necrosis: NO Has patient had a PCN reaction that required hospitalizationNO Has patient had a PCN reaction occurring within the last 10 years: NO If all of the above answers are "NO", then may proceed with Cephalosporin use.  . Shellfish Allergy Swelling    lips  . Latex Rash    Metabolic Disorder Labs: Lab Results  Component Value Date   HGBA1C 5.4 05/09/2015   MPG 108 05/09/2015   No results found for: PROLACTIN Lab Results  Component Value Date   CHOL 128 05/09/2015   TRIG 79 05/09/2015   HDL 41 05/09/2015  CHOLHDL 3.1 05/09/2015   VLDL 16 05/09/2015   LDLCALC 71 05/09/2015   Lab Results  Component Value Date   TSH 2.093 08/26/2017    Therapeutic Level Labs: No results found for: LITHIUM No results found for: VALPROATE No components found for:  CBMZ  Current Medications: Current Outpatient Medications  Medication Sig Dispense Refill  . ARIPiprazole (ABILIFY) 30 MG tablet Take 1 tablet (30 mg total) by mouth daily. 90 tablet 0  . aspirin 81 MG tablet Take 162 mg by mouth daily.    . benztropine (COGENTIN) 0.5 MG tablet Take 1 tablet (0.5 mg total) by mouth at bedtime. 30 tablet 1  . buPROPion (WELLBUTRIN XL) 150 MG 24 hr tablet TAKE 1 TABLET BY MOUTH EVERY DAY WITH BREAKFAST 90 tablet 0  . cetirizine-pseudoephedrine (ZYRTEC-D) 5-120 MG per tablet Take 1 tablet by mouth 2 (two) times daily as needed for allergies.    . Cholecalciferol (VITAMIN D3) 5000 units CAPS Take 1 capsule by mouth daily.    . Eszopiclone 3 MG TABS Take 1 tablet (3 mg total) by mouth at bedtime. Take immediately before bedtime 30 tablet 2  . ferrous sulfate 325 (65 FE) MG tablet Take 325 mg by mouth daily with breakfast.    . hydrOXYzine (VISTARIL) 25 MG capsule Take 1 capsule (25 mg total) by mouth  2 (two) times daily as needed. For anxiety attacks 60 capsule 1  . ibuprofen (ADVIL,MOTRIN) 800 MG tablet Take 1 tablet (800 mg total) by mouth every 8 (eight) hours as needed for moderate pain. 15 tablet 0  . lamoTRIgine (LAMICTAL) 25 MG tablet Take 3 tablets (75 mg total) by mouth daily. 270 tablet 0  . PARAGARD INTRAUTERINE COPPER IUD IUD 1 each by Intrauterine route once.    . prazosin (MINIPRESS) 1 MG capsule TAKE 1 CAPSULE (1 MG TOTAL) BY MOUTH AT BEDTIME. NIGHT MARES 90 capsule 1  . SUPREP BOWEL PREP KIT 17.5-3.13-1.6 GM/177ML SOLN     . vitamin B-12 (CYANOCOBALAMIN) 1000 MCG tablet Take 1,000 mcg by mouth daily as needed (energy). Reported on 05/08/2015     No current facility-administered medications for this visit.     Musculoskeletal: Strength & Muscle Tone: UTA Gait & Station: normal Patient leans: N/A  Psychiatric Specialty Exam: Review of Systems  Psychiatric/Behavioral: Positive for dysphoric mood, hallucinations and sleep disturbance. The patient is nervous/anxious.   All other systems reviewed and are negative.   There were no vitals taken for this visit.There is no height or weight on file to calculate BMI.  General Appearance: Casual  Eye Contact:  Fair  Speech:  Clear and Coherent  Volume:  Normal  Mood:  Anxious and Dysphoric improving  Affect:  Congruent  Thought Process:  Goal Directed and Descriptions of Associations: Circumstantial  Orientation:  Full (Time, Place, and Person)  Thought Content: Hallucinations: Visual - Shadows or images out of corner her eyes   Suicidal Thoughts:  No  Homicidal Thoughts:  No  Memory:  Immediate;   Fair Recent;   Fair Remote;   Fair  Judgement:  Fair  Insight:  Fair  Psychomotor Activity:  Normal  Concentration:  Concentration: Fair and Attention Span: Fair  Recall:  [employer] of Knowledge: Fair  Language: Fair  Akathisia:  No  Handed:  Right  AIMS (if indicated): UTA  Assets:  Communication Skills Desire for  Improvement Housing Social Support  ADL's:  Intact  Cognition: WNL  Sleep:  Restless   Screenings: [employer]  Office Visit from 03/24/2018 in [facility], [facility] Office Visit from 09/09/2017 [facility], [facility] Office Visit from 08/26/2017 in [facility], [facility]  PHQ-2 Total Score  0  4  6  PHQ-9 Total Score  --  15  21       Assessment and Plan: Deysi is a 49 year old African-American female who has a history of bipolar disorder, panic attacks, insomnia, history of trauma was evaluated by telemedicine today.  Patient is biologically predisposed due to her multiple health problems.  She also has psychosocial stressors of the pandemic, financial problems as well as pending disability hearing report.  Patient continues to struggle with sleep issues, restless lower extremities which affects her sleep.  She however reports mood symptoms as improving.  Plan as noted below.  Plan Bipolar disorder in partial remission Abilify 30 mg p.o. daily Lamotrigine 75 mg p.o. daily Wellbutrin XL 150 mg p.o. daily  Panic attacks-improving Continue CBT with Ms. Miguel Dibble Hydroxyzine 25 mg p.o. twice daily as needed.  Insomnia-restless Sleep study came back within normal limits Patient to continue Lunesta 3 mg p.o. nightly Prazosin 1 mg p.o. nightly for nightmares   Neuroleptic induced parkinsonian some-unstable Unknown if patient's restlessness at bedtime is secondary to her Abilify. Add Cogentin 0.5 mg p.o. nightly  Cognitive disorder-unspecified Continue neurology follow-ups.  Follow-up in clinic in 4 weeks or sooner if needed.  I have spent atleast 20 minutes non face to face with patient today. More than 50 % of the time was spent for preparing to see the patient ( e.g., review of test, records ), ordering medications and test ,psychoeducation and supportive psychotherapy and care coordination,as well as documenting clinical information in electronic  health record. This note was generated in part or whole with voice recognition software. Voice recognition is usually quite accurate but there are transcription errors that can and very often do occur. I apologize for any typographical errors that were not detected and corrected.       Ursula Alert, MD 04/30/2019, 5:15 PM

## 2019-05-01 ENCOUNTER — Other Ambulatory Visit: Payer: Self-pay | Admitting: Psychiatry

## 2019-05-01 DIAGNOSIS — F410 Panic disorder [episodic paroxysmal anxiety] without agoraphobia: Secondary | ICD-10-CM

## 2019-05-16 ENCOUNTER — Inpatient Hospital Stay: Payer: 59

## 2019-05-16 ENCOUNTER — Inpatient Hospital Stay: Payer: 59 | Admitting: Internal Medicine

## 2019-05-17 ENCOUNTER — Encounter: Payer: Self-pay | Admitting: Internal Medicine

## 2019-05-17 ENCOUNTER — Other Ambulatory Visit: Payer: Self-pay

## 2019-05-17 ENCOUNTER — Other Ambulatory Visit: Payer: Self-pay | Admitting: *Deleted

## 2019-05-17 DIAGNOSIS — R350 Frequency of micturition: Secondary | ICD-10-CM

## 2019-05-17 DIAGNOSIS — E611 Iron deficiency: Secondary | ICD-10-CM

## 2019-05-18 ENCOUNTER — Inpatient Hospital Stay: Payer: 59 | Attending: Internal Medicine

## 2019-05-18 ENCOUNTER — Inpatient Hospital Stay: Payer: 59

## 2019-05-18 ENCOUNTER — Inpatient Hospital Stay (HOSPITAL_BASED_OUTPATIENT_CLINIC_OR_DEPARTMENT_OTHER): Payer: 59 | Admitting: Internal Medicine

## 2019-05-18 VITALS — BP 133/81 | HR 74 | Temp 97.0°F | Wt 224.0 lb

## 2019-05-18 DIAGNOSIS — E611 Iron deficiency: Secondary | ICD-10-CM | POA: Diagnosis not present

## 2019-05-18 DIAGNOSIS — Z79899 Other long term (current) drug therapy: Secondary | ICD-10-CM | POA: Diagnosis not present

## 2019-05-18 DIAGNOSIS — F419 Anxiety disorder, unspecified: Secondary | ICD-10-CM | POA: Insufficient documentation

## 2019-05-18 DIAGNOSIS — D649 Anemia, unspecified: Secondary | ICD-10-CM | POA: Insufficient documentation

## 2019-05-18 DIAGNOSIS — R350 Frequency of micturition: Secondary | ICD-10-CM

## 2019-05-18 DIAGNOSIS — Z809 Family history of malignant neoplasm, unspecified: Secondary | ICD-10-CM | POA: Diagnosis not present

## 2019-05-18 DIAGNOSIS — Z87891 Personal history of nicotine dependence: Secondary | ICD-10-CM | POA: Insufficient documentation

## 2019-05-18 DIAGNOSIS — R300 Dysuria: Secondary | ICD-10-CM | POA: Diagnosis not present

## 2019-05-18 LAB — CBC WITH DIFFERENTIAL/PLATELET
Abs Immature Granulocytes: 0.02 10*3/uL (ref 0.00–0.07)
Basophils Absolute: 0 10*3/uL (ref 0.0–0.1)
Basophils Relative: 0 %
Eosinophils Absolute: 0.1 10*3/uL (ref 0.0–0.5)
Eosinophils Relative: 2 %
HCT: 36.2 % (ref 36.0–46.0)
Hemoglobin: 12.2 g/dL (ref 12.0–15.0)
Immature Granulocytes: 0 %
Lymphocytes Relative: 17 %
Lymphs Abs: 1.2 10*3/uL (ref 0.7–4.0)
MCH: 30.1 pg (ref 26.0–34.0)
MCHC: 33.7 g/dL (ref 30.0–36.0)
MCV: 89.4 fL (ref 80.0–100.0)
Monocytes Absolute: 0.5 10*3/uL (ref 0.1–1.0)
Monocytes Relative: 7 %
Neutro Abs: 5.4 10*3/uL (ref 1.7–7.7)
Neutrophils Relative %: 74 %
Platelets: 212 10*3/uL (ref 150–400)
RBC: 4.05 MIL/uL (ref 3.87–5.11)
RDW: 13.1 % (ref 11.5–15.5)
WBC: 7.4 10*3/uL (ref 4.0–10.5)
nRBC: 0 % (ref 0.0–0.2)

## 2019-05-18 LAB — URINALYSIS, COMPLETE (UACMP) WITH MICROSCOPIC
Bilirubin Urine: NEGATIVE
Glucose, UA: NEGATIVE mg/dL
Ketones, ur: NEGATIVE mg/dL
Nitrite: NEGATIVE
Protein, ur: 30 mg/dL — AB
Specific Gravity, Urine: 1.015 (ref 1.005–1.030)
pH: 5 (ref 5.0–8.0)

## 2019-05-18 NOTE — Assessment & Plan Note (Addendum)
#   Anemia hemoglobin 11.3; FERRITIN-9 [[facility]] S/p IV iron infusion; no significant improvement in fatigue noted post infusion.  Recommend holding infusion today.  Today hemoglobin is 12.2.  Continue p.o. iron.  # ? Etiology of anemia: Unclear-heavy menstrual cycles versus others.  S/p GI evaluation.  #Fatigue-unlikely mild iron deficiency; possible neurologic medication versus others.  #Dysuria/increased frequency of urination-recommend UA and culture.  # Family history of malignancy [stomach; pancreatic cancers]; discussed with patient.  In agreement.  For genetic counseling-recommend genetic counseling.  # DISPOSITION: # HOLD Venofer # referral to genetics re: family hx of cancer # follow up in 4 month- MD; labs- cbc/bmp/ironstudies/feritin- possible venofer-Dr.B  Addendum: Urine culture positive for E. Coli; sensitive to ciprofloxacin.  Will call in prescription.

## 2019-05-18 NOTE — Progress Notes (Signed)
[facility] NOTE  Patient Care Team: Lavera Guise, MD as PCP - General (Internal Medicine)  CHIEF COMPLAINTS/PURPOSE OF CONSULTATION: Anemia.  HEMATOLOGY HISTORY  # ANEMIA colonoscopy-2016 [Dr.Wohl]; FERRITIN -9 [[facility]; [facility]]-hemoglobin 11.3  # ? Seizure [ Neurology; [facility]; [facility]]; history of stroke [followed by psychiatry]  HISTORY OF PRESENTING ILLNESS:  Brittney Duran 49 y.o.  female with low iron levels/mild anemia is here for follow-up.  Patient currently status post GI evaluation.  S/p IV iron infusion no significant improvement in fatigue.  Patient is on p.o. iron.  Continues to have fatigue.  Also complains of dysuria.  Increased frequency of urination.  No fevers.  Review of Systems  Constitutional: Positive for malaise/fatigue. Negative for chills, diaphoresis, fever and weight loss.  HENT: Negative for nosebleeds and sore throat.   Eyes: Negative for double vision.  Respiratory: Negative for cough, hemoptysis, sputum production, shortness of breath and wheezing.   Cardiovascular: Negative for chest pain, palpitations, orthopnea and leg swelling.  Gastrointestinal: Negative for abdominal pain, blood in stool, constipation, diarrhea, heartburn, melena, nausea and vomiting.  Genitourinary: Positive for dysuria, frequency and urgency.  Musculoskeletal: Positive for joint pain. Negative for back pain.  Skin: Negative.  Negative for itching and rash.  Neurological: Negative for dizziness, tingling, focal weakness, weakness and headaches.  Endo/Heme/Allergies: Does not bruise/bleed easily.  Psychiatric/Behavioral: Negative for depression. The patient is not nervous/anxious and does not have insomnia.     MEDICAL HISTORY:  Past Medical History:  Diagnosis Date  . Anemia   . Anxiety    Panic attacks  . Complication of anesthesia    pt reports "local" wears off quickly  . Depression   . Environmental allergies   . Hemorrhoids   . Leaky heart  valve   . Sciatica 09/08/2017  . Wears contact lenses     SURGICAL HISTORY: Past Surgical History:  Procedure Laterality Date  . Caesaran section  1989  . COLONOSCOPY WITH PROPOFOL N/A 10/07/2014   Procedure: COLONOSCOPY WITH PROPOFOL;  Surgeon: Lucilla Lame, MD;  Location: [facility];  Service: Endoscopy;  Laterality: N/A;  Latex  . HERNIA REPAIR  1978    SOCIAL HISTORY: Social History   Socioeconomic History  . Marital status: Married    Spouse name: duglaus Pedigo  . Number of children: 5  . Years of education: 57  . Highest education level: Some college, no degree  Occupational History  . Occupation: [employer]  Tobacco Use  . Smoking status: Former Smoker    Years: 15.00  . Smokeless tobacco: Never Used  Substance and Sexual Activity  . Alcohol use: Not Currently    Alcohol/week: 0.0 standard drinks    Comment: OCC will drink wine   . Drug use: No  . Sexual activity: Yes    Birth control/protection: I.U.D.    Comment: Paragard  Other Topics Concern  . Not on file  Social History Narrative   Lives at home w/ her husband and children; Left-handed; Drinks 1 cup of coffee per day. Lives in [place]; used to work for [employer]/ awaiting for disability [sec to stroke]; no smoking; rare alcohol/wine.    Social Determinants of Health   Financial Resource Strain:   . Difficulty of Paying Living Expenses:   Food Insecurity:   . Worried About Charity fundraiser in the Last Year:   . Arboriculturist in the Last Year:   Transportation Needs:   . Film/video editor (Medical):   Marland Kitchen  Lack of Transportation (Non-Medical):   Physical Activity:   . Days of Exercise per Week:   . Minutes of Exercise per Session:   Stress:   . Feeling of Stress :   Social Connections:   . Frequency of Communication with Friends and Family:   . Frequency of Social Gatherings with Friends and Family:   . Attends Religious Services:   . Active Member of Clubs or Organizations:   .  Attends Archivist Meetings:   Marland Kitchen Marital Status:   Intimate Partner Violence:   . Fear of Current or Ex-Partner:   . Emotionally Abused:   Marland Kitchen Physically Abused:   . Sexually Abused:     FAMILY HISTORY: Family History  Problem Relation Age of Onset  . Hypertension Mother   . Seizures Brother   . Pancreatic cancer Other        pat- aunt.     ALLERGIES:  is allergic to lithium; other; penicillins; shellfish allergy; and latex.  MEDICATIONS:  Current Outpatient Medications  Medication Sig Dispense Refill  . ARIPiprazole (ABILIFY) 30 MG tablet Take 1 tablet (30 mg total) by mouth daily. 90 tablet 0  . aspirin 81 MG tablet Take 162 mg by mouth daily.    . benztropine (COGENTIN) 0.5 MG tablet Take 1 tablet (0.5 mg total) by mouth at bedtime. 30 tablet 1  . buPROPion (WELLBUTRIN XL) 150 MG 24 hr tablet TAKE 1 TABLET BY MOUTH EVERY DAY WITH BREAKFAST 90 tablet 0  . cetirizine-pseudoephedrine (ZYRTEC-D) 5-120 MG per tablet Take 1 tablet by mouth 2 (two) times daily as needed for allergies.    . Cholecalciferol (VITAMIN D3) 5000 units CAPS Take 1 capsule by mouth daily.    . Eszopiclone 3 MG TABS Take 1 tablet (3 mg total) by mouth at bedtime. Take immediately before bedtime 30 tablet 2  . ferrous sulfate 325 (65 FE) MG tablet Take 325 mg by mouth daily with breakfast.    . hydrOXYzine (VISTARIL) 25 MG capsule TAKE 1 CAPSULE (25 MG TOTAL) BY MOUTH 2 (TWO) TIMES DAILY AS NEEDED. FOR ANXIETY ATTACKS 60 capsule 1  . ibuprofen (ADVIL,MOTRIN) 800 MG tablet Take 1 tablet (800 mg total) by mouth every 8 (eight) hours as needed for moderate pain. 15 tablet 0  . lamoTRIgine (LAMICTAL) 25 MG tablet Take 3 tablets (75 mg total) by mouth daily. 270 tablet 0  . PARAGARD INTRAUTERINE COPPER IUD IUD 1 each by Intrauterine route once.    . prazosin (MINIPRESS) 1 MG capsule TAKE 1 CAPSULE (1 MG TOTAL) BY MOUTH AT BEDTIME. NIGHT MARES 90 capsule 1  . SUPREP BOWEL PREP KIT 17.5-3.13-1.6 GM/177ML SOLN      . vitamin B-12 (CYANOCOBALAMIN) 1000 MCG tablet Take 1,000 mcg by mouth daily as needed (energy). Reported on 05/08/2015    . ciprofloxacin (CIPRO) 500 MG tablet Take 1 tablet (500 mg total) by mouth 2 (two) times daily. 6 tablet 0   No current facility-administered medications for this visit.      PHYSICAL EXAMINATION:   Vitals:   05/17/19 1600  BP: 133/81  Pulse: 74  Temp: (!) 97 F (36.1 C)   Filed Weights   05/17/19 1600  Weight: 224 lb (101.6 kg)    Physical Exam  Constitutional: She is oriented to person, place, and time and well-developed, well-nourished, and in no distress.  HENT:  Head: Normocephalic and atraumatic.  Mouth/Throat: Oropharynx is clear and moist. No oropharyngeal exudate.  Eyes: Pupils are equal, round, and  reactive to light.  Cardiovascular: Normal rate and regular rhythm.  Pulmonary/Chest: No respiratory distress. She has no wheezes.  Abdominal: Soft. Bowel sounds are normal. She exhibits no distension and no mass. There is no abdominal tenderness. There is no rebound and no guarding.  Musculoskeletal:        General: No tenderness or edema. Normal range of motion.     Cervical back: Normal range of motion and neck supple.  Neurological: She is alert and oriented to person, place, and time.  Skin: Skin is warm.  Psychiatric: Affect normal.    LABORATORY DATA:  I have reviewed the data as listed Lab Results  Component Value Date   WBC 7.4 05/18/2019   HGB 12.2 05/18/2019   HCT 36.2 05/18/2019   MCV 89.4 05/18/2019   PLT 212 05/18/2019   Recent Labs    12/14/18 0159  NA 139  K 4.4  CL 109  CO2 22  GLUCOSE 104*  BUN 16  CREATININE 0.86  CALCIUM 9.7  GFRNONAA >60  GFRAA >60     No results found.  Iron deficiency # Anemia hemoglobin 11.3; FERRITIN-9 [[facility]] S/p IV iron infusion; no significant improvement in fatigue noted post infusion.  Recommend holding infusion today.  Today hemoglobin is 12.2.  Continue p.o. iron.  # ?  Etiology of anemia: Unclear-heavy menstrual cycles versus others.  S/p GI evaluation.  #Fatigue-unlikely mild iron deficiency; possible neurologic medication versus others.  #Dysuria/increased frequency of urination-recommend UA and culture.  # Family history of malignancy [stomach; pancreatic cancers]; discussed with patient.  In agreement.  For genetic counseling-recommend genetic counseling.  # DISPOSITION: # HOLD Venofer # referral to genetics re: family hx of cancer # follow up in 4 month- MD; labs- cbc/bmp/ironstudies/feritin- possible venofer-Dr.B  Addendum: Urine culture positive for E. Coli; sensitive to ciprofloxacin.  Will call in prescription.  All questions were answered. The patient knows to call the clinic with any problems, questions or concerns.   Cammie Sickle, MD 05/20/2019 2:10 PM

## 2019-05-20 ENCOUNTER — Telehealth: Payer: Self-pay | Admitting: Internal Medicine

## 2019-05-20 LAB — URINE CULTURE: Culture: >=100000 — AB

## 2019-05-20 MED ORDER — CIPROFLOXACIN HCL 500 MG PO TABS
500.0000 mg | ORAL_TABLET | Freq: Two times a day (BID) | ORAL | 0 refills | Status: DC
Start: 2019-05-20 — End: 2019-08-31

## 2019-05-20 NOTE — Telephone Encounter (Signed)
H/T-please confirm with patient my chart message.  Thank you.   ----------------------------------------------------------------------------------------------------------------- Hi-inform you that your culture is positive for urine infection-E. coli.  I called in a prescription for ciprofloxacin.  I do not see allergies listed to it.  If any allergies let us know.   Please call us if any questions or concerns.  Thanks. Dr.B

## 2019-05-21 NOTE — Telephone Encounter (Signed)
Per chart - patient read mychart msg

## 2019-05-23 ENCOUNTER — Other Ambulatory Visit: Payer: Self-pay

## 2019-05-23 ENCOUNTER — Telehealth (INDEPENDENT_AMBULATORY_CARE_PROVIDER_SITE_OTHER): Payer: 59 | Admitting: Psychiatry

## 2019-05-23 ENCOUNTER — Encounter: Payer: Self-pay | Admitting: Psychiatry

## 2019-05-23 DIAGNOSIS — F410 Panic disorder [episodic paroxysmal anxiety] without agoraphobia: Secondary | ICD-10-CM | POA: Diagnosis not present

## 2019-05-23 DIAGNOSIS — F3177 Bipolar disorder, in partial remission, most recent episode mixed: Secondary | ICD-10-CM | POA: Diagnosis not present

## 2019-05-23 DIAGNOSIS — G2111 Neuroleptic induced parkinsonism: Secondary | ICD-10-CM

## 2019-05-23 DIAGNOSIS — F09 Unspecified mental disorder due to known physiological condition: Secondary | ICD-10-CM

## 2019-05-23 DIAGNOSIS — G4701 Insomnia due to medical condition: Secondary | ICD-10-CM

## 2019-05-23 MED ORDER — LAMOTRIGINE 100 MG PO TABS: 100.0000 mg | ORAL_TABLET | Freq: Every day | ORAL | 0 refills | Status: DC

## 2019-05-23 NOTE — Progress Notes (Signed)
Provider Location : [facility] Patient Location : Home  Virtual Visit via Video Note  I connected with Brittney Duran on 05/23/19 at 10:20 AM EDT by a video enabled telemedicine application and verified that I am speaking with the correct person using two identifiers.   I discussed the limitations of evaluation and management by telemedicine and the availability of in person appointments. The patient expressed understanding and agreed to proceed.    I discussed the assessment and treatment plan with the patient. The patient was provided an opportunity to ask questions and all were answered. The patient agreed with the plan and demonstrated an understanding of the instructions.   The patient was advised to call back or seek an in-person evaluation if the symptoms worsen or if the condition fails to improve as anticipated.  [facility] MD OP Progress Note  05/23/2019 12:40 PM Brittney Duran  MRN:  [id number]  Chief Complaint:  Chief Complaint    Follow-up     HPI: Brittney Duran is a 49 year old African-American female, married, unemployed, has applied for disability, lives in [place], has a history of bipolar disorder, panic attacks, insomnia, history of TIA was evaluated by telemedicine today.    Patient today reports she recently had a few episodes when she felt depressed.  She reports this happened after she had a discussion with her husband and also she had a talk with her therapist Ms. Miguel Dibble recently.  She reports just the discussion of her disability hearing does trigger her depressive symptoms.  3 weeks ago she had a lot of negative thoughts and felt hopeless and worthless.  However she felt better after that until yesterday when she had aconversation with her therapist.  She reports she went to bed early since she was depressed and this morning decided not to go for her exercise with her friend even though her friend called her.  She reports she ruminates about her financial problems and her  relationship stressors.  She reports sleep is good.  She is compliant on medications.  She reports she was recently started on antibiotic for urinary tract infection.  She currently takes iron pills for her iron deficiency.    She denies any suicidality, homicidality at this time.  She does history of hearing voices like name calling and seeing things.  She did not express any recent episodes of the same.  Patient denies any other concerns today. Visit Diagnosis:    ICD-10-CM   1. Bipolar disorder, in partial remission, most recent episode mixed (HCC)  F31.77 lamoTRIgine (LAMICTAL) 100 MG tablet  2. Panic disorder  F41.0   3. 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 Cognitive disorder  F09     Past Psychiatric History: I have reviewed past psychiatric history from my progress note on 04/18/2017  Past Medical History:  Past Medical History:  Diagnosis Date  . Anemia   . Anxiety    Panic attacks  . Complication of anesthesia    pt reports "local" wears off quickly  . Depression   . Environmental allergies   . Hemorrhoids   . Leaky heart valve   . Sciatica 09/08/2017  . Wears contact lenses     Past Surgical History:  Procedure Laterality Date  . Caesaran section  1989  . COLONOSCOPY WITH PROPOFOL N/A 10/07/2014   Procedure: COLONOSCOPY WITH PROPOFOL;  Surgeon: Lucilla Lame, MD;  Location: [facility];  Service: Endoscopy;  Laterality: N/A;  Latex  . HERNIA REPAIR  1978    Family Psychiatric History: Reviewed  family psychiatric history from my progress note on 04/18/2017  Family History:  Family History  Problem Relation Age of Onset  . Hypertension Mother   . Seizures Brother   . Pancreatic cancer Other        pat- aunt.     Social History: Reviewed social history from my progress note on 04/18/2017 Social History   Socioeconomic History  . Marital status: Married    Spouse name: duglaus Hochmuth  .  Number of children: 5  . Years of education: 86  . Highest education level: Some college, no degree  Occupational History  . Occupation: [employer]  Tobacco Use  . Smoking status: Former Smoker    Years: 15.00  . Smokeless tobacco: Never Used  Substance and Sexual Activity  . Alcohol use: Not Currently    Alcohol/week: 0.0 standard drinks    Comment: OCC will drink wine   . Drug use: No  . Sexual activity: Yes    Birth control/protection: I.U.D.    Comment: Paragard  Other Topics Concern  . Not on file  Social History Narrative   Lives at home w/ her husband and children; Left-handed; Drinks 1 cup of coffee per day. Lives in [place]; used to work for [employer]/ awaiting for disability [sec to stroke]; no smoking; rare alcohol/wine.    Social Determinants of Health   Financial Resource Strain:   . Difficulty of Paying Living Expenses:   Food Insecurity:   . Worried About Charity fundraiser in the Last Year:   . Arboriculturist in the Last Year:   Transportation Needs:   . Film/video editor (Medical):   Marland Kitchen Lack of Transportation (Non-Medical):   Physical Activity:   . Days of Exercise per Week:   . Minutes of Exercise per Session:   Stress:   . Feeling of Stress :   Social Connections:   . Frequency of Communication with Friends and Family:   . Frequency of Social Gatherings with Friends and Family:   . Attends Religious Services:   . Active Member of Clubs or Organizations:   . Attends Archivist Meetings:   Marland Kitchen Marital Status:     Allergies:  Allergies  Allergen Reactions  . Lithium Swelling    Face swelling  Face swelling  . Other Swelling    lips  . Penicillins Other (See Comments)    Unknown reaction Has patient had a PCN reaction causing immediate rash, facial/tongue/throat swelling, SOB or lightheadedness with hypotension: YES Has patient had a PCN reaction causing severe rash involving mucus membranes or skin necrosis: NO Has patient had a  PCN reaction that required hospitalizationNO Has patient had a PCN reaction occurring within the last 10 years: NO If all of the above answers are "NO", then may proceed with Cephalosporin use.  . Shellfish Allergy Swelling    lips  . Latex Rash    Metabolic Disorder Labs: Lab Results  Component Value Date   HGBA1C 5.4 05/09/2015   MPG 108 05/09/2015   No results found for: PROLACTIN Lab Results  Component Value Date   CHOL 128 05/09/2015   TRIG 79 05/09/2015   HDL 41 05/09/2015   CHOLHDL 3.1 05/09/2015   VLDL 16 05/09/2015   LDLCALC 71 05/09/2015   Lab Results  Component Value Date   TSH 2.093 08/26/2017    Therapeutic Level Labs: No  results found for: LITHIUM No results found for: VALPROATE No components found for:  CBMZ  Current Medications: Current Outpatient Medications  Medication Sig Dispense Refill  . ARIPiprazole (ABILIFY) 30 MG tablet Take 1 tablet (30 mg total) by mouth daily. 90 tablet 0  . aspirin 81 MG tablet Take 162 mg by mouth daily.    . benztropine (COGENTIN) 0.5 MG tablet Take 1 tablet (0.5 mg total) by mouth at bedtime. 30 tablet 1  . buPROPion (WELLBUTRIN XL) 150 MG 24 hr tablet TAKE 1 TABLET BY MOUTH EVERY DAY WITH BREAKFAST 90 tablet 0  . cetirizine-pseudoephedrine (ZYRTEC-D) 5-120 MG per tablet Take 1 tablet by mouth 2 (two) times daily as needed for allergies.    . Cholecalciferol (VITAMIN D3) 5000 units CAPS Take 1 capsule by mouth daily.    . ciprofloxacin (CIPRO) 500 MG tablet Take 1 tablet (500 mg total) by mouth 2 (two) times daily. 6 tablet 0  . Eszopiclone 3 MG TABS Take 1 tablet (3 mg total) by mouth at bedtime. Take immediately before bedtime 30 tablet 2  . ferrous sulfate 325 (65 FE) MG tablet Take 325 mg by mouth daily with breakfast.    . hydrOXYzine (VISTARIL) 25 MG capsule TAKE 1 CAPSULE (25 MG TOTAL) BY MOUTH 2 (TWO) TIMES DAILY AS NEEDED. FOR ANXIETY ATTACKS 60 capsule 1  . ibuprofen (ADVIL,MOTRIN) 800 MG tablet Take 1 tablet  (800 mg total) by mouth every 8 (eight) hours as needed for moderate pain. 15 tablet 0  . PARAGARD INTRAUTERINE COPPER IUD IUD 1 each by Intrauterine route once.    . prazosin (MINIPRESS) 1 MG capsule TAKE 1 CAPSULE (1 MG TOTAL) BY MOUTH AT BEDTIME. NIGHT MARES 90 capsule 1  . SUPREP BOWEL PREP KIT 17.5-3.13-1.6 GM/177ML SOLN     . vitamin B-12 (CYANOCOBALAMIN) 1000 MCG tablet Take 1,000 mcg by mouth daily as needed (energy). Reported on 05/08/2015    . lamoTRIgine (LAMICTAL) 100 MG tablet Take 1 tablet (100 mg total) by mouth daily. 90 tablet 0   No current facility-administered medications for this visit.     Musculoskeletal: Strength & Muscle Tone: UTA Gait & Station: normal Patient leans: N/A  Psychiatric Specialty Exam: Review of Systems  Psychiatric/Behavioral: Positive for dysphoric mood.  All other systems reviewed and are negative.   There were no vitals taken for this visit.There is no height or weight on file to calculate BMI.  General Appearance: Casual  Eye Contact:  Fair  Speech:  Clear and Coherent  Volume:  Normal  Mood:  Dysphoric  Affect:  Congruent  Thought Process:  Goal Directed and Descriptions of Associations: Intact  Orientation:  Full (Time, Place, and Person)  Thought Content: Logical   Suicidal Thoughts:  No  Homicidal Thoughts:  No  Memory:  Immediate;   Fair Recent;   Fair Remote;   Fair  Judgement:  Intact  Insight:  Fair  Psychomotor Activity:  Normal  Concentration:  Concentration: Fair and Attention Span: Fair  Recall:  [employer] of Knowledge: Fair  Language: Fair  Akathisia:  No  Handed:  Right  AIMS (if indicated):UTA  Assets:  Communication Skills Desire for [place] Talents/Skills Transportation Vocational/Educational  ADL's:  Intact  Cognition: WNL  Sleep:  Fair   Screenings: PHQ2-9     Office Visit from 03/24/2018 in [facility], [facility] Office Visit from 09/09/2017 in [facility], [facility] Office Visit from 08/26/2017 in [facility], [facility]  PHQ-2 Total Score  0  4  6  PHQ-9 Total Score  --  15  21       Assessment and Plan: Tashara is a 49 year old African-American female who has a history of bipolar disorder, panic attacks, insomnia, history of trauma was evaluated by telemedicine today.  Patient is biologically predisposed due to her multiple health problems.  Patient does have psychosocial stressors of the pandemic, financial problems as well as pending disability hearing result.  Patient continues to have depressive symptoms on and off and will continue to benefit from medication readjustment.  Plan as noted below.  Plan Bipolar disorder-improving Abilify 30 mg p.o. daily Increase Lamictal to 100 mg p.o. daily.  She does report episodes of depressive symptoms usually triggered by situational stressors. Wellbutrin XL 150 mg p.o. daily Continue CBT every 3 weeks with Ms. Miguel Dibble.  Panic attacks-improving Continue CBT. Hydroxyzine 25 mg p.o. twice daily as needed  Insomnia-stable Sleep study came back within normal limits Lunesta 3 mg p.o. nightly Prazosin 1 mg p.o. nightly for nightmares  Neuroleptic induced parkinsonism-stable Patient to continue Cogentin 0.5 mg p.o. nightly.  Cognitive disorder-unspecified Continue neurology follow-ups.  I have reviewed medical records in [facility] per Dr. Linden Dolin recent visit dated 05/18/2019-patient was recently started on ciprofloxacin for UTI.  Crisis plan discussed with patient.  Follow-up in clinic in 3 to 4 weeks or sooner if needed  I have spent atleast 20 minutes non face to face with patient today. More than 50 % of the time was spent for preparing to see the patient ( e.g., review of test, records ), obtaining and to review and separately obtained history , ordering medications and test ,psychoeducation and supportive psychotherapy and care coordination,as well as documenting  clinical information in electronic health record. This note was generated in part or whole with voice recognition software. Voice recognition is usually quite accurate but there are transcription errors that can and very often do occur. I apologize for any typographical errors that were not detected and corrected.       Ursula Alert, MD 05/23/2019, 12:40 PM

## 2019-05-25 ENCOUNTER — Other Ambulatory Visit: Payer: Self-pay | Admitting: Psychiatry

## 2019-05-25 ENCOUNTER — Ambulatory Visit: Admission: RE | Admit: 2019-05-25 | Payer: 59 | Source: Home / Self Care | Admitting: Gastroenterology

## 2019-05-25 ENCOUNTER — Encounter: Admission: RE | Payer: Self-pay | Source: Home / Self Care

## 2019-05-25 DIAGNOSIS — G2111 Neuroleptic induced parkinsonism: Secondary | ICD-10-CM

## 2019-05-25 DIAGNOSIS — F410 Panic disorder [episodic paroxysmal anxiety] without agoraphobia: Secondary | ICD-10-CM

## 2019-05-25 SURGERY — COLONOSCOPY WITH PROPOFOL
Anesthesia: Choice

## 2019-05-26 ENCOUNTER — Other Ambulatory Visit: Payer: Self-pay | Admitting: Psychiatry

## 2019-05-26 DIAGNOSIS — F410 Panic disorder [episodic paroxysmal anxiety] without agoraphobia: Secondary | ICD-10-CM

## 2019-05-26 DIAGNOSIS — F5105 Insomnia due to other mental disorder: Secondary | ICD-10-CM

## 2019-05-28 MED ORDER — ARIPIPRAZOLE 30 MG PO TABS: 30.0000 mg | ORAL_TABLET | Freq: Every day | ORAL | 0 refills | Status: DC

## 2019-05-28 NOTE — Addendum Note (Signed)
Addended byJomarie Longs on: 05/28/2019 01:47 PM   Modules accepted: Orders

## 2019-05-28 NOTE — Telephone Encounter (Signed)
I have sent Abilify for 90-day supply to pharmacy.  Wellbutrin she has enough for now and is not due for refill yet.

## 2019-05-28 NOTE — Telephone Encounter (Signed)
pt needs a 90 day supply of medication .  abilify and wellbutrin

## 2019-05-29 ENCOUNTER — Inpatient Hospital Stay: Payer: 59 | Admitting: Genetic Counselor

## 2019-05-29 ENCOUNTER — Telehealth: Payer: Self-pay

## 2019-05-29 ENCOUNTER — Telehealth: Payer: Self-pay | Admitting: Genetic Counselor

## 2019-05-29 DIAGNOSIS — G2111 Neuroleptic induced parkinsonism: Secondary | ICD-10-CM

## 2019-05-29 MED ORDER — BENZTROPINE MESYLATE 0.5 MG PO TABS: 0.5000 mg | ORAL_TABLET | Freq: Every day | ORAL | 0 refills | Status: DC

## 2019-05-29 NOTE — Telephone Encounter (Signed)
Received a fax from [facility] that insurance will only pay for 90 day supply   benztropine (COGENTIN) 0.5 MG tablet Medication Date: 05/25/2019 Department: [facility] Ordering/Authorizing: Jomarie Longs, MD  Order Providers  Prescribing Provider Encounter Provider  Jomarie Longs, MD Jomarie Longs, MD  Outpatient Medication Detail   Disp Refills Start End   benztropine (COGENTIN) 0.5 MG tablet 30 tablet 1 05/25/2019    Sig: TAKE 1 TABLET BY MOUTH AT BEDTIME.   Sent to pharmacy as: benztropine (COGENTIN) 0.5 MG tablet   E-Prescribing Status: Receipt confirmed by pharmacy (05/25/2019 1:15 PM EDT)

## 2019-05-29 NOTE — Telephone Encounter (Signed)
Per request I have sent 90 days supply for cogentin.

## 2019-05-29 NOTE — Telephone Encounter (Signed)
Spoke with [name] regarding her virtual genetic counseling appointment scheduled for today (05/29/19) at 2pm. [name] had forgotten about the appointment and currently has people working on her house, creating a lot of noise. She would like to reschedule this appointment for another time. Provided our contact information - she will call back when she is ready to reschedule.

## 2019-06-04 ENCOUNTER — Ambulatory Visit: Payer: 59 | Admitting: Gastroenterology

## 2019-06-08 ENCOUNTER — Telehealth: Payer: Self-pay

## 2019-06-08 NOTE — Telephone Encounter (Signed)
Confirmed and screened for 06-12-19 ov. 

## 2019-06-12 ENCOUNTER — Other Ambulatory Visit: Payer: Self-pay

## 2019-06-12 ENCOUNTER — Encounter: Payer: Self-pay | Admitting: Nurse Practitioner

## 2019-06-12 ENCOUNTER — Ambulatory Visit (INDEPENDENT_AMBULATORY_CARE_PROVIDER_SITE_OTHER): Payer: 59 | Admitting: Nurse Practitioner

## 2019-06-12 VITALS — BP 127/70 | HR 72 | Temp 95.9°F | Resp 16 | Ht 67.0 in | Wt 224.4 lb

## 2019-06-12 DIAGNOSIS — F3177 Bipolar disorder, in partial remission, most recent episode mixed: Secondary | ICD-10-CM

## 2019-06-12 DIAGNOSIS — Z6835 Body mass index (BMI) 35.0-35.9, adult: Secondary | ICD-10-CM

## 2019-06-12 DIAGNOSIS — R635 Abnormal weight gain: Secondary | ICD-10-CM | POA: Diagnosis not present

## 2019-06-12 DIAGNOSIS — Z0001 Encounter for general adult medical examination with abnormal findings: Secondary | ICD-10-CM

## 2019-06-12 DIAGNOSIS — Z6834 Body mass index (BMI) 34.0-34.9, adult: Secondary | ICD-10-CM | POA: Insufficient documentation

## 2019-06-12 DIAGNOSIS — B029 Zoster without complications: Secondary | ICD-10-CM | POA: Diagnosis not present

## 2019-06-12 DIAGNOSIS — R5383 Other fatigue: Secondary | ICD-10-CM

## 2019-06-12 DIAGNOSIS — Z6837 Body mass index (BMI) 37.0-37.9, adult: Secondary | ICD-10-CM | POA: Insufficient documentation

## 2019-06-12 DIAGNOSIS — R300 Dysuria: Secondary | ICD-10-CM

## 2019-06-12 DIAGNOSIS — Z6839 Body mass index (BMI) 39.0-39.9, adult: Secondary | ICD-10-CM | POA: Insufficient documentation

## 2019-06-12 MED ORDER — PREDNISONE 10 MG (21) PO TBPK: ORAL_TABLET | ORAL | 0 refills | Status: DC

## 2019-06-12 MED ORDER — PHENTERMINE HCL 37.5 MG PO TABS: 37.5000 mg | ORAL_TABLET | Freq: Every day | ORAL | 0 refills | Status: DC

## 2019-06-12 MED ORDER — TRIAMCINOLONE ACETONIDE 0.025 % EX CREA: 1.0000 "application " | TOPICAL_CREAM | Freq: Two times a day (BID) | CUTANEOUS | 2 refills | Status: DC

## 2019-06-12 MED ORDER — VALACYCLOVIR HCL 1 G PO TABS: 1000.0000 mg | ORAL_TABLET | Freq: Two times a day (BID) | ORAL | 0 refills | Status: DC

## 2019-06-12 NOTE — Progress Notes (Signed)
[facility] [place], [place] [address]  Internal MEDICINE  Office Visit Note  Patient Name: Brittney Duran  [id number]  [id number]  Date of Service: 06/12/2019   Pt is here for routine health maintenance examination  Chief Complaint  Patient presents with  . Annual Exam  . Anemia  . Depression  . Anxiety  . Rash    on face, by collar bone   . Weight Management     The patient is here for health maintenance exam. She does see GYN for well woman part of her exam. She has been having trouble with very heavy menstrual bleeding. Has Paraguard IUD and GYN believes this may be part of the problem. Is candidate to have endometrial ablation after IUD removed, however, husband is holding out on vasectomy. Periods so heavy, she developed significant anemia. Had to have four iron infusions at cancer center. Blood count now doing much better. Does not have to return until August for further evaluation.  She is due to have routine, fasting blood work. Had mammogram in 03/2019 and it was negative.  Continues to see psychiatry and counseling services on routine basis. She does continue to suffer with severe depression.  Patient states that she has a rash on right side of her neck and shoulder, and a little bit on her face. She states that it itches and is sometimes painful. She states that it cam out of nowhere. She states that this has been present for four or five days. Feels like it may be getting worse, however, the location that she first noted the rash, seems to be healing well. She states that she has put some Bactroban ointment on area and it has not helped at all.     Current Medication: Outpatient Encounter Medications as of 06/12/2019  Medication Sig  . ARIPiprazole (ABILIFY) 30 MG tablet Take 1 tablet (30 mg total) by mouth daily.  Marland Kitchen aspirin 81 MG tablet Take 162 mg by mouth daily.  . benztropine (COGENTIN) 0.5 MG tablet Take 1 tablet (0.5 mg total) by mouth  at bedtime.  Marland Kitchen buPROPion (WELLBUTRIN XL) 150 MG 24 hr tablet TAKE 1 TABLET BY MOUTH EVERY DAY WITH BREAKFAST  . cetirizine-pseudoephedrine (ZYRTEC-D) 5-120 MG per tablet Take 1 tablet by mouth 2 (two) times daily as needed for allergies.  . Cholecalciferol (VITAMIN D3) 5000 units CAPS Take 1 capsule by mouth daily.  . ciprofloxacin (CIPRO) 500 MG tablet Take 1 tablet (500 mg total) by mouth 2 (two) times daily.  . Eszopiclone 3 MG TABS Take 1 tablet (3 mg total) by mouth at bedtime. Take immediately before bedtime  . ferrous sulfate 325 (65 FE) MG tablet Take 325 mg by mouth daily with breakfast.  . hydrOXYzine (VISTARIL) 25 MG capsule TAKE 1 CAPSULE (25 MG TOTAL) BY MOUTH 2 (TWO) TIMES DAILY AS NEEDED. FOR ANXIETY ATTACKS  . ibuprofen (ADVIL,MOTRIN) 800 MG tablet Take 1 tablet (800 mg total) by mouth every 8 (eight) hours as needed for moderate pain.  Marland Kitchen lamoTRIgine (LAMICTAL) 100 MG tablet Take 1 tablet (100 mg total) by mouth daily.  Marland Kitchen PARAGARD INTRAUTERINE COPPER IUD IUD 1 each by Intrauterine route once.  . prazosin (MINIPRESS) 1 MG capsule TAKE 1 CAPSULE (1 MG TOTAL) BY MOUTH AT BEDTIME. NIGHT MARES  . SUPREP BOWEL PREP KIT 17.5-3.13-1.6 GM/177ML SOLN   . vitamin B-12 (CYANOCOBALAMIN) 1000 MCG tablet Take 1,000 mcg by mouth daily as needed (energy). Reported on 05/08/2015  . phentermine (  ADIPEX-P) 37.5 MG tablet Take 1 tablet (37.5 mg total) by mouth daily before breakfast.  . predniSONE (STERAPRED UNI-PAK 21 TAB) 10 MG (21) TBPK tablet 6 day taper - take by mouth as directed for 6 days  . triamcinolone (KENALOG) 0.025 % cream Apply 1 application topically 2 (two) times daily.  . valACYclovir (VALTREX) 1000 MG tablet Take 1 tablet (1,000 mg total) by mouth 2 (two) times daily.   No facility-administered encounter medications on file as of 06/12/2019.    Surgical History: Past Surgical History:  Procedure Laterality Date  . Caesaran section  1989  . COLONOSCOPY WITH PROPOFOL N/A 10/07/2014    Procedure: COLONOSCOPY WITH PROPOFOL;  Surgeon: Lucilla Lame, MD;  Location: [facility];  Service: Endoscopy;  Laterality: N/A;  Latex  . HERNIA REPAIR  1978    Medical History: Past Medical History:  Diagnosis Date  . Anemia   . Anxiety    Panic attacks  . Complication of anesthesia    pt reports "local" wears off quickly  . Depression   . Environmental allergies   . Hemorrhoids   . Leaky heart valve   . Sciatica 09/08/2017  . Wears contact lenses     Family History: Family History  Problem Relation Age of Onset  . Hypertension Mother   . Seizures Brother   . Pancreatic cancer Other        pat- aunt.       Review of Systems  Constitutional: Negative for chills, fatigue and unexpected weight change.       Twenty pound weight gain over the past year.   HENT: Negative for congestion, postnasal drip, rhinorrhea, sneezing and sore throat.   Respiratory: Negative for cough, chest tightness, shortness of breath and wheezing.   Cardiovascular: Negative for chest pain and palpitations.  Gastrointestinal: Negative for abdominal pain, constipation, diarrhea, nausea and vomiting.  Endocrine: Negative for cold intolerance, heat intolerance, polydipsia and polyuria.  Musculoskeletal: Negative for arthralgias, back pain, joint swelling and neck pain.  Skin: Positive for rash.  Allergic/Immunologic: Negative for environmental allergies.  Neurological: Negative for dizziness, tremors, numbness and headaches.  Hematological: Negative for adenopathy. Does not bruise/bleed easily.  Psychiatric/Behavioral: Positive for dysphoric mood. Negative for behavioral problems (Depression), sleep disturbance and suicidal ideas. The patient is nervous/anxious.        The patient sees psychiatry on routine basis.      Today's Vitals   06/12/19 0937  BP: 127/70  Pulse: 72  Resp: 16  Temp: (!) 95.9 F (35.5 C)  SpO2: 99%  Weight: 224 lb 6.4 oz (101.8 kg)  Height: 5' 7"  (1.702 m)    Body mass index is 35.15 kg/m.   Physical Exam Vitals and nursing note reviewed.  Constitutional:      General: She is not in acute distress.    Appearance: Normal appearance. She is well-developed. She is obese. She is not diaphoretic.  HENT:     Head: Normocephalic and atraumatic.     Nose: Nose normal.     Mouth/Throat:     Pharynx: No oropharyngeal exudate.  Eyes:     Pupils: Pupils are equal, round, and reactive to light.  Neck:     Thyroid: No thyromegaly.     Vascular: No carotid bruit or JVD.     Trachea: No tracheal deviation.  Cardiovascular:     Rate and Rhythm: Normal rate and regular rhythm.     Pulses: Normal pulses.     Heart sounds: Normal  heart sounds. No murmur. No friction rub. No gallop.   Pulmonary:     Effort: Pulmonary effort is normal. No respiratory distress.     Breath sounds: Normal breath sounds. No wheezing or rales.  Chest:     Chest wall: No tenderness.  Abdominal:     General: Bowel sounds are normal.     Palpations: Abdomen is soft.     Tenderness: There is no abdominal tenderness.  Musculoskeletal:        General: Normal range of motion.     Cervical back: Normal range of motion and neck supple.  Lymphadenopathy:     Cervical: No cervical adenopathy.  Skin:    General: Skin is warm and dry.       Neurological:     General: No focal deficit present.     Mental Status: She is alert and oriented to person, place, and time. Mental status is at baseline.     Cranial Nerves: No cranial nerve deficit.  Psychiatric:        Mood and Affect: Mood normal.        Behavior: Behavior normal.        Thought Content: Thought content normal.        Judgment: Judgment normal.     LABS: Recent Results (from the past 2160 hour(s))  CBC with Differential     Status: None   Collection Time: 05/18/19  1:25 PM  Result Value Ref Range   WBC 7.4 4.0 - 10.5 K/uL   RBC 4.05 3.87 - 5.11 MIL/uL   Hemoglobin 12.2 12.0 - 15.0 g/dL   HCT 36.2 36.0 -  46.0 %   MCV 89.4 80.0 - 100.0 fL   MCH 30.1 26.0 - 34.0 pg   MCHC 33.7 30.0 - 36.0 g/dL   RDW 13.1 11.5 - 15.5 %   Platelets 212 150 - 400 K/uL   nRBC 0.0 0.0 - 0.2 %   Neutrophils Relative % 74 %   Neutro Abs 5.4 1.7 - 7.7 K/uL   Lymphocytes Relative 17 %   Lymphs Abs 1.2 0.7 - 4.0 K/uL   Monocytes Relative 7 %   Monocytes Absolute 0.5 0.1 - 1.0 K/uL   Eosinophils Relative 2 %   Eosinophils Absolute 0.1 0.0 - 0.5 K/uL   Basophils Relative 0 %   Basophils Absolute 0.0 0.0 - 0.1 K/uL   Immature Granulocytes 0 %   Abs Immature Granulocytes 0.02 0.00 - 0.07 K/uL    Comment: Performed at [facility], [address] [place], [place] [address]  Urinalysis, Complete w Microscopic     Status: Abnormal   Collection Time: 05/18/19  1:25 PM  Result Value Ref Range   Color, Urine YELLOW (A) YELLOW   APPearance HAZY (A) CLEAR   Specific Gravity, Urine 1.015 1.005 - 1.030   pH 5.0 5.0 - 8.0   Glucose, UA NEGATIVE NEGATIVE mg/dL   Hgb urine dipstick MODERATE (A) NEGATIVE   Bilirubin Urine NEGATIVE NEGATIVE   Ketones, ur NEGATIVE NEGATIVE mg/dL   Protein, ur 30 (A) NEGATIVE mg/dL   Nitrite NEGATIVE NEGATIVE   Leukocytes,Ua LARGE (A) NEGATIVE   RBC / HPF 0-5 0 - 5 RBC/hpf   WBC, UA 11-20 0 - 5 WBC/hpf   Bacteria, UA FEW (A) NONE SEEN   Squamous Epithelial / LPF 0-5 0 - 5   Mucus PRESENT     Comment: Performed at [facility], [address], [place], [place] [address]  Urine culture     Status: Abnormal   Collection Time: 05/18/19  1:25 PM   Specimen: Urine, Clean Catch  Result Value Ref Range   Specimen Description      URINE, CLEAN CATCH Performed at [facility], [address] [place], [place] [address]    Special Requests      NONE Performed at [facility], [place], [place] [address]    Culture >=100,000 COLONIES/mL ESCHERICHIA COLI (A)    Report Status 05/20/2019 FINAL    Organism ID, Bacteria ESCHERICHIA COLI (A)        Susceptibility   Escherichia coli - MIC*    AMPICILLIN 4 SENSITIVE Sensitive     CEFAZOLIN <=4 SENSITIVE Sensitive     CEFTRIAXONE <=1 SENSITIVE Sensitive     CIPROFLOXACIN <=0.25 SENSITIVE Sensitive     GENTAMICIN <=1 SENSITIVE Sensitive     IMIPENEM <=0.25 SENSITIVE Sensitive     NITROFURANTOIN <=16 SENSITIVE Sensitive     TRIMETH/SULFA <=20 SENSITIVE Sensitive     AMPICILLIN/SULBACTAM <=2 SENSITIVE Sensitive     PIP/TAZO <=4 SENSITIVE Sensitive     * >=100,000 COLONIES/mL ESCHERICHIA COLI    Assessment/Plan: 1. Encounter for general adult medical examination with abnormal findings Annual health maintenance exam today. Lab slip given for her to have routine, fasting labs tested.   2. Other fatigue She has been treated for iron deficiency anemia with iron infusions to treat. Check CBC. Follows up with hematology in august.   3. Abnormal weight gain Check labs, inculding thyroid panel for further evaluation.   4. Herpes zoster without complication Start vltrex 1065m twice dally for next 14 days. Add prednisone dose pack. Take as directed for 6 days. May use triamcinolone cream twice daily as needed for itching and irritation.  - predniSONE (STERAPRED UNI-PAK 21 TAB) 10 MG (21) TBPK tablet; 6 day taper - take by mouth as directed for 6 days  Dispense: 21 tablet; Refill: 0 - triamcinolone (KENALOG) 0.025 % cream; Apply 1 application topically 2 (two) times daily.  Dispense: 80 g; Refill: 2 - valACYclovir (VALTREX) 1000 MG tablet; Take 1 tablet (1,000 mg total) by mouth 2 (two) times daily.  Dispense: 14 tablet; Refill: 0  5. BMI 35.0-35.9,adult May restart phentermine 37.567mdaily after completing treatment for shingles. Limit calorie intake to 1200-1500 calories per day. She should incorporate exercise into her daily routine.  - phentermine (ADIPEX-P) 37.5 MG tablet; Take 1 tablet (37.5 mg total) by mouth daily before breakfast.  Dispense: 30 tablet; Refill: 0  6. Bipolar disorder,  in partial remission, most recent episode mixed [facility] [place] [facility].   7. Dysuria - Urinalysis, Routine w reflex microscopic  General Counseling: Monquie verbalizes understanding of the findings of todays visit and agrees with plan of treatment. I have discussed any further diagnostic evaluation that may be needed or ordered today. We also reviewed her medications today. she has been encouraged to call the office with any questions or concerns that should arise related to todays visit.    Counseling:   There is a liability release in patients' chart. There has been a 10 minute discussion about the side effects including but not limited to elevated blood pressure, anxiety, lack of sleep and dry mouth. Pt understands and will like to start/continue on appetite suppressant at this time. There will be one month RX given at the time of visit with proper follow up. Nova diet plan with restricted calories  is given to the pt. Pt understands and agrees with  plan of treatment  This patient was seen by Leretha Pol FNP Collaboration with Dr Lavera Guise as a part of collaborative care agreement  Orders Placed This Encounter  Procedures  . Urinalysis, Routine w reflex microscopic    Meds ordered this encounter  Medications  . predniSONE (STERAPRED UNI-PAK 21 TAB) 10 MG (21) TBPK tablet    Sig: 6 day taper - take by mouth as directed for 6 days    Dispense:  21 tablet    Refill:  0    Order Specific Question:   Supervising Provider    Answer:   Lavera Guise [place]  . triamcinolone (KENALOG) 0.025 % cream    Sig: Apply 1 application topically 2 (two) times daily.    Dispense:  80 g    Refill:  2    Order Specific Question:   Supervising Provider    Answer:   Lavera Guise [[id number]]  . valACYclovir (VALTREX) 1000 MG tablet    Sig: Take 1 tablet (1,000 mg total) by mouth 2 (two) times daily.    Dispense:  14 tablet    Refill:  0    Order Specific  Question:   Supervising Provider    Answer:   Lavera Guise [[id number]]  . phentermine (ADIPEX-P) 37.5 MG tablet    Sig: Take 1 tablet (37.5 mg total) by mouth daily before breakfast.    Dispense:  30 tablet    Refill:  0    Order Specific Question:   Supervising Provider    Answer:   Lavera Guise [[id number]]    Total time spent: 30 Minutes  Time spent includes review of chart, medications, test results, and follow up plan with the patient.     Lavera Guise, MD  Internal Medicine

## 2019-06-13 LAB — URINALYSIS, ROUTINE W REFLEX MICROSCOPIC
Bilirubin, UA: NEGATIVE
Glucose, UA: NEGATIVE
Ketones, UA: NEGATIVE
Leukocytes,UA: NEGATIVE
Nitrite, UA: NEGATIVE
Protein,UA: NEGATIVE
RBC, UA: NEGATIVE
Specific Gravity, UA: 1.027 (ref 1.005–1.030)
Urobilinogen, Ur: 0.2 mg/dL (ref 0.2–1.0)
pH, UA: 5 (ref 5.0–7.5)

## 2019-06-19 ENCOUNTER — Telehealth (INDEPENDENT_AMBULATORY_CARE_PROVIDER_SITE_OTHER): Payer: 59 | Admitting: Psychiatry

## 2019-06-19 ENCOUNTER — Other Ambulatory Visit: Payer: Self-pay

## 2019-06-19 ENCOUNTER — Encounter: Payer: Self-pay | Admitting: Psychiatry

## 2019-06-19 DIAGNOSIS — G4701 Insomnia due to medical condition: Secondary | ICD-10-CM | POA: Diagnosis not present

## 2019-06-19 DIAGNOSIS — G2111 Neuroleptic induced parkinsonism: Secondary | ICD-10-CM | POA: Diagnosis not present

## 2019-06-19 DIAGNOSIS — F3178 Bipolar disorder, in full remission, most recent episode mixed: Secondary | ICD-10-CM | POA: Diagnosis not present

## 2019-06-19 DIAGNOSIS — F410 Panic disorder [episodic paroxysmal anxiety] without agoraphobia: Secondary | ICD-10-CM

## 2019-06-19 DIAGNOSIS — F09 Unspecified mental disorder due to known physiological condition: Secondary | ICD-10-CM

## 2019-06-19 NOTE — Progress Notes (Signed)
Provider Location : [facility] Patient Location : Home  Virtual Visit via Video Note  I connected with Brittney Duran on 06/19/19 at  3:00 PM EDT by a video enabled telemedicine application and verified that I am speaking with the correct person using two identifiers.   I discussed the limitations of evaluation and management by telemedicine and the availability of in person appointments. The patient expressed understanding and agreed to proceed.    I discussed the assessment and treatment plan with the patient. The patient was provided an opportunity to ask questions and all were answered. The patient agreed with the plan and demonstrated an understanding of the instructions.   The patient was advised to call back or seek an in-person evaluation if the symptoms worsen or if the condition fails to improve as anticipated.   [place] MD OP Progress Note  06/19/2019 9:56 PM Brittney Duran  MRN:  [id number]  Chief Complaint:  Chief Complaint    Follow-up     HPI: Brittney Duran is a 49 year old African-American female, married, unemployed, has applied for disability, lives in [place], has a history of bipolar disorder, panic attacks, insomnia, history of TIA was evaluated by telemedicine today.  Patient today reports she was recently diagnosed with shingles and was placed on Valtrex and prednisone by her primary care provider.  She reports her shingles has since resolved.  She does not believe her rash or skin lesions were due to the Lamictal.  Patient reports mood wise she is doing okay.  She does not have any significant mood swings or depressive symptoms.  She denies any suicidality.  She denies any auditory hallucinations.  She reports she continues to have visual hallucinations of seeing shadows or images out of the corner of her eyes.  She reports she is compliant on medications as prescribed.  She however is worried about her weight gain.  She reports she was recently started on phentermine by  her primary care provider.  Patient reports she continues to be motivated to be in psychotherapy sessions with Ms. Miguel Dibble which is going well.  She reports she has been walking 3 miles or so in the morning whenever she can and also going to the gym with a friend.  She reports she feels more motivated to do things than before.  She continues to have good social support system.  She does have psychosocial stressors of financial problems and awaiting her disability hearing.  Visit Diagnosis:    ICD-10-CM   1. Bipolar disorder, in full remission, most recent episode mixed ([place])  F31.78   2. Panic disorder  F41.0   3. 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 Cognitive disorder  F09     Past Psychiatric History: I have reviewed past psychiatric history from my progress note on 04/18/2017  Past Medical History:  Past Medical History:  Diagnosis Date  . Anemia   . Anxiety    Panic attacks  . Complication of anesthesia    pt reports "local" wears off quickly  . Depression   . Environmental allergies   . Hemorrhoids   . Leaky heart valve   . Sciatica 09/08/2017  . Wears contact lenses     Past Surgical History:  Procedure Laterality Date  . Caesaran section  1989  . COLONOSCOPY WITH PROPOFOL N/A 10/07/2014   Procedure: COLONOSCOPY WITH PROPOFOL;  Surgeon: Lucilla Lame, MD;  Location: [place];  Service: Endoscopy;  Laterality: N/A;  Latex  . HERNIA REPAIR  1978    Family Psychiatric History: I have reviewed family psychiatric history from my progress note on 04/18/2017  Family History:  Family History  Problem Relation Age of Onset  . Hypertension Mother   . Seizures Brother   . Pancreatic cancer Other        pat- aunt.     Social History: Reviewed social history from my progress note on 04/18/2017 Social History   Socioeconomic History  . Marital status: Married    Spouse name: duglaus Kissoon  .  Number of children: 5  . Years of education: 87  . Highest education level: Some college, no degree  Occupational History  . Occupation: [employer]  Tobacco Use  . Smoking status: Former Smoker    Years: 15.00  . Smokeless tobacco: Never Used  Substance and Sexual Activity  . Alcohol use: Yes    Alcohol/week: 0.0 standard drinks    Comment: drinks red wine 2-3 times a week  . Drug use: No  . Sexual activity: Yes    Birth control/protection: I.U.D.    Comment: Paragard  Other Topics Concern  . Not on file  Social History Narrative   Lives at home w/ her husband and children; Left-handed; Drinks 1 cup of coffee per day. Lives in [place]; used to work for [employer]/ awaiting for disability [sec to stroke]; no smoking; rare alcohol/wine.    Social Determinants of Health   Financial Resource Strain:   . Difficulty of Paying Living Expenses:   Food Insecurity:   . Worried About Charity fundraiser in the Last Year:   . Arboriculturist in the Last Year:   Transportation Needs:   . Film/video editor (Medical):   Marland Kitchen Lack of Transportation (Non-Medical):   Physical Activity:   . Days of Exercise per Week:   . Minutes of Exercise per Session:   Stress:   . Feeling of Stress :   Social Connections:   . Frequency of Communication with Friends and Family:   . Frequency of Social Gatherings with Friends and Family:   . Attends Religious Services:   . Active Member of Clubs or Organizations:   . Attends Archivist Meetings:   Marland Kitchen Marital Status:     Allergies:  Allergies  Allergen Reactions  . Lithium Swelling    Face swelling  Face swelling  . Other Swelling    lips  . Penicillins Other (See Comments)    Unknown reaction Has patient had a PCN reaction causing immediate rash, facial/tongue/throat swelling, SOB or lightheadedness with hypotension: YES Has patient had a PCN reaction causing severe rash involving mucus membranes or skin necrosis: NO Has patient had a  PCN reaction that required hospitalizationNO Has patient had a PCN reaction occurring within the last 10 years: NO If all of the above answers are "NO", then may proceed with Cephalosporin use.  . Shellfish Allergy Swelling    lips  . Latex Rash    Metabolic Disorder Labs: Lab Results  Component Value Date   HGBA1C 5.4 05/09/2015   MPG 108 05/09/2015   No results found for: PROLACTIN Lab Results  Component Value Date   CHOL 128 05/09/2015   TRIG 79 05/09/2015   HDL 41 05/09/2015   CHOLHDL 3.1 05/09/2015   VLDL 16 05/09/2015   LDLCALC 71 05/09/2015   Lab Results  Component Value Date   TSH 2.093 08/26/2017  Therapeutic Level Labs: No results found for: LITHIUM No results found for: VALPROATE No components found for:  CBMZ  Current Medications: Current Outpatient Medications  Medication Sig Dispense Refill  . ARIPiprazole (ABILIFY) 30 MG tablet Take 1 tablet (30 mg total) by mouth daily. 90 tablet 0  . aspirin 81 MG tablet Take 162 mg by mouth daily.    . benztropine (COGENTIN) 0.5 MG tablet Take 1 tablet (0.5 mg total) by mouth at bedtime. 90 tablet 0  . buPROPion (WELLBUTRIN XL) 150 MG 24 hr tablet TAKE 1 TABLET BY MOUTH EVERY DAY WITH BREAKFAST 90 tablet 0  . cetirizine-pseudoephedrine (ZYRTEC-D) 5-120 MG per tablet Take 1 tablet by mouth 2 (two) times daily as needed for allergies.    . Cholecalciferol (VITAMIN D3) 5000 units CAPS Take 1 capsule by mouth daily.    . ciprofloxacin (CIPRO) 500 MG tablet Take 1 tablet (500 mg total) by mouth 2 (two) times daily. 6 tablet 0  . Eszopiclone 3 MG TABS Take 1 tablet (3 mg total) by mouth at bedtime. Take immediately before bedtime 30 tablet 2  . ferrous sulfate 325 (65 FE) MG tablet Take 325 mg by mouth daily with breakfast.    . hydrOXYzine (VISTARIL) 25 MG capsule TAKE 1 CAPSULE (25 MG TOTAL) BY MOUTH 2 (TWO) TIMES DAILY AS NEEDED. FOR ANXIETY ATTACKS 180 capsule 1  . ibuprofen (ADVIL,MOTRIN) 800 MG tablet Take 1 tablet  (800 mg total) by mouth every 8 (eight) hours as needed for moderate pain. 15 tablet 0  . lamoTRIgine (LAMICTAL) 100 MG tablet Take 1 tablet (100 mg total) by mouth daily. 90 tablet 0  . PARAGARD INTRAUTERINE COPPER IUD IUD 1 each by Intrauterine route once.    . phentermine (ADIPEX-P) 37.5 MG tablet Take 1 tablet (37.5 mg total) by mouth daily before breakfast. 30 tablet 0  . prazosin (MINIPRESS) 1 MG capsule TAKE 1 CAPSULE (1 MG TOTAL) BY MOUTH AT BEDTIME. NIGHT MARES 90 capsule 1  . predniSONE (STERAPRED UNI-PAK 21 TAB) 10 MG (21) TBPK tablet 6 day taper - take by mouth as directed for 6 days 21 tablet 0  . SUPREP BOWEL PREP KIT 17.5-3.13-1.6 GM/177ML SOLN     . triamcinolone (KENALOG) 0.025 % cream Apply 1 application topically 2 (two) times daily. 80 g 2  . valACYclovir (VALTREX) 1000 MG tablet Take 1 tablet (1,000 mg total) by mouth 2 (two) times daily. 14 tablet 0  . vitamin B-12 (CYANOCOBALAMIN) 1000 MCG tablet Take 1,000 mcg by mouth daily as needed (energy). Reported on 05/08/2015     No current facility-administered medications for this visit.     Musculoskeletal: Strength & Muscle Tone: UTA Gait & Station: normal Patient leans: N/A  Psychiatric Specialty Exam: Review of Systems  Psychiatric/Behavioral: Positive for hallucinations (Shadows). Negative for agitation, behavioral problems, confusion, decreased concentration, dysphoric mood, self-injury, sleep disturbance and suicidal ideas. The patient is not nervous/anxious and is not hyperactive.   All other systems reviewed and are negative.   There were no vitals taken for this visit.There is no height or weight on file to calculate BMI.  General Appearance: Casual  Eye Contact:  Fair  Speech:  Clear and Coherent  Volume:  Normal  Mood:  Euthymic  Affect:  Congruent  Thought Process:  Goal Directed and Descriptions of Associations: Intact  Orientation:  Full (Time, Place, and Person)  Thought Content: Hallucinations:  Visual shadows or images out of the corner of her eyes  Suicidal Thoughts:  No  Homicidal Thoughts:  No  Memory:  Immediate;   Fair Recent;   Fair Remote;   Fair  Judgement:  Fair  Insight:  Fair  Psychomotor Activity:  Normal  Concentration:  Concentration: Fair and Attention Span: Fair  Recall:  [employer] of Knowledge: Fair  Language: Fair  Akathisia:  No  Handed:  Right  AIMS (if indicated): UTA  Assets:  Communication Skills Desire for Improvement Housing Social Support  ADL's:  Intact  Cognition: WNL  Sleep:  Fair   Screenings: AUDIT     [facility] Visit from 06/12/2019 in [facility], [facility]  Alcohol Use Disorder Identification Test Final Score (AUDIT)  3    PHQ2-9     Office Visit from 06/12/2019 in [facility], [facility] Office Visit from 03/24/2018 in [facility] Office Visit from 09/09/2017 in [facility], [facility] Office Visit from 08/26/2017 in [facility], [facility]  PHQ-2 Total Score  4  0  4  6  PHQ-9 Total Score  18  --  15  21       Assessment and Plan: ESPARANZA KRIDER is a 49 year old African-American female who has a history of bipolar disorder, panic attacks, insomnia, history of trauma was evaluated by telemedicine today.  Patient is biologically predisposed due to her multiple health problems.  Patient with psychosocial stressors of the pandemic, financial problems as well as pending disability hearing.  Patient however is currently making progress although she does have visual hallucinations on and off.  She will continue to benefit from medications and psychotherapy sessions.  Plan Bipolar disorder in full remission Abilify 30 mg p.o. daily Lamictal 100 mg p.o. daily. Wellbutrin XL 150 mg p.o. daily Continue CBT with Ms. Miguel Dibble  Panic attacks-improving Continue CBT Hydroxyzine 25 mg p.o. twice daily as needed  Insomnia-stable Sleep study came back within normal limits Lunesta 3 mg p.o. nightly as  needed Prazosin 1 mg p.o. nightly for nightmares  Neuroleptic induced parkinsonism-stable Continue Cogentin 0.5 mg p.o. nightly  Discussed with patient that her phentermine could possibly make her psychosis worse.  Patient continues to have psychosis at this time which is currently stable on medications.  Patient advised to have a discussion with her primary care provider if she believes her psychosis is getting worse on the phentermine.  Follow-up in clinic in 6 to 8 weeks or sooner if needed.  I have spent atleast 20 minutes non face to face with patient today. More than 50 % of the time was spent for preparing to see the patient ( e.g., review of test, records ), ordering medications and test ,psychoeducation and supportive psychotherapy and care coordination,as well as documenting clinical information in electronic health record. This note was generated in part or whole with voice recognition software. Voice recognition is usually quite accurate but there are transcription errors that can and very often do occur. I apologize for any typographical errors that were not detected and corrected.       Ursula Alert, MD 06/19/2019, 9:56 PM

## 2019-07-13 ENCOUNTER — Telehealth: Payer: Self-pay

## 2019-07-13 NOTE — Telephone Encounter (Signed)
Confirmed and screened for 07-17-19 ov. 

## 2019-07-17 ENCOUNTER — Ambulatory Visit: Payer: 59 | Admitting: Nurse Practitioner

## 2019-07-20 ENCOUNTER — Ambulatory Visit: Payer: 59 | Admitting: Nurse Practitioner

## 2019-07-20 ENCOUNTER — Other Ambulatory Visit: Payer: Self-pay

## 2019-07-20 ENCOUNTER — Telehealth: Payer: Self-pay

## 2019-07-20 ENCOUNTER — Encounter: Payer: Self-pay | Admitting: Nurse Practitioner

## 2019-07-20 VITALS — BP 134/72 | HR 78 | Temp 97.6°F | Resp 16 | Ht 67.0 in | Wt 221.0 lb

## 2019-07-20 DIAGNOSIS — Z6834 Body mass index (BMI) 34.0-34.9, adult: Secondary | ICD-10-CM | POA: Diagnosis not present

## 2019-07-20 DIAGNOSIS — E611 Iron deficiency: Secondary | ICD-10-CM | POA: Diagnosis not present

## 2019-07-20 DIAGNOSIS — F3177 Bipolar disorder, in partial remission, most recent episode mixed: Secondary | ICD-10-CM | POA: Diagnosis not present

## 2019-07-20 MED ORDER — PHENTERMINE HCL 37.5 MG PO TABS: 37.5000 mg | ORAL_TABLET | Freq: Every day | ORAL | 0 refills | Status: DC

## 2019-07-20 NOTE — Progress Notes (Signed)
[facility] [place], [place] [address]  Internal MEDICINE  Office Visit Note  Patient Name: Brittney Duran  [id number]  [id number]  Date of Service: 07/28/2019  Chief Complaint  Patient presents with  . Follow-up    wt management  . Depression    The patient is here for routine follow up. She has been taking phentermine 37.36m tablets intermittently. The patient has lost 3 pounds since her most recent visit. She reports no negative side effects associated with taking this medication. She attributes weight gain to abilify which is prescribed per psychiatry.  The patient does have long history of severe depression and bipolar disorder. Sometimes, she does have psychosis related to this condition. The patient's psychiatrist and I have both advised her that taking stimulant medication may increase the risk for psychotic symptoms. Due to this increased risk, the patient takes only half tablet at a time of the phentermine. She will go a few days in between doses sometimes. She has not had increased symptoms related to taking appetite suppressant.       Current Medication: Outpatient Encounter Medications as of 07/20/2019  Medication Sig  . ARIPiprazole (ABILIFY) 30 MG tablet Take 1 tablet (30 mg total) by mouth daily.  .Marland Kitchenaspirin 81 MG tablet Take 162 mg by mouth daily.  . benztropine (COGENTIN) 0.5 MG tablet Take 1 tablet (0.5 mg total) by mouth at bedtime.  .Marland KitchenbuPROPion (WELLBUTRIN XL) 150 MG 24 hr tablet TAKE 1 TABLET BY MOUTH EVERY DAY WITH BREAKFAST  . cetirizine-pseudoephedrine (ZYRTEC-D) 5-120 MG per tablet Take 1 tablet by mouth 2 (two) times daily as needed for allergies.  . Cholecalciferol (VITAMIN D3) 5000 units CAPS Take 1 capsule by mouth daily.  . ciprofloxacin (CIPRO) 500 MG tablet Take 1 tablet (500 mg total) by mouth 2 (two) times daily.  . Eszopiclone 3 MG TABS Take 1 tablet (3 mg total) by mouth at bedtime. Take immediately before bedtime  .  ferrous sulfate 325 (65 FE) MG tablet Take 325 mg by mouth daily with breakfast.  . hydrOXYzine (VISTARIL) 25 MG capsule TAKE 1 CAPSULE (25 MG TOTAL) BY MOUTH 2 (TWO) TIMES DAILY AS NEEDED. FOR ANXIETY ATTACKS  . ibuprofen (ADVIL,MOTRIN) 800 MG tablet Take 1 tablet (800 mg total) by mouth every 8 (eight) hours as needed for moderate pain.  .Marland KitchenlamoTRIgine (LAMICTAL) 100 MG tablet Take 1 tablet (100 mg total) by mouth daily.  .Marland KitchenPARAGARD INTRAUTERINE COPPER IUD IUD 1 each by Intrauterine route once.  . phentermine (ADIPEX-P) 37.5 MG tablet Take 1 tablet (37.5 mg total) by mouth daily before breakfast.  . prazosin (MINIPRESS) 1 MG capsule TAKE 1 CAPSULE (1 MG TOTAL) BY MOUTH AT BEDTIME. NIGHT MARES  . SUPREP BOWEL PREP KIT 17.5-3.13-1.6 GM/177ML SOLN   . triamcinolone (KENALOG) 0.025 % cream Apply 1 application topically 2 (two) times daily.  . valACYclovir (VALTREX) 1000 MG tablet Take 1 tablet (1,000 mg total) by mouth 2 (two) times daily.  . vitamin B-12 (CYANOCOBALAMIN) 1000 MCG tablet Take 1,000 mcg by mouth daily as needed (energy). Reported on 05/08/2015  . [DISCONTINUED] phentermine (ADIPEX-P) 37.5 MG tablet Take 1 tablet (37.5 mg total) by mouth daily before breakfast.  . [DISCONTINUED] predniSONE (STERAPRED UNI-PAK 21 TAB) 10 MG (21) TBPK tablet 6 day taper - take by mouth as directed for 6 days   No facility-administered encounter medications on file as of 07/20/2019.    Surgical History: Past Surgical History:  Procedure Laterality Date  .  Caesaran section  1989  . COLONOSCOPY WITH PROPOFOL N/A 10/07/2014   Procedure: COLONOSCOPY WITH PROPOFOL;  Surgeon: Lucilla Lame, MD;  Location: [place];  Service: Endoscopy;  Laterality: N/A;  Latex  . HERNIA REPAIR  1978    Medical History: Past Medical History:  Diagnosis Date  . Anemia   . Anxiety    Panic attacks  . Complication of anesthesia    pt reports "local" wears off quickly  . Depression   . Environmental allergies    . Hemorrhoids   . Leaky heart valve   . Sciatica 09/08/2017  . Wears contact lenses     Family History: Family History  Problem Relation Age of Onset  . Hypertension Mother   . Seizures Brother   . Pancreatic cancer Other        pat- aunt.     Social History   Socioeconomic History  . Marital status: Married    Spouse name: duglaus Keepers  . Number of children: 5  . Years of education: 27  . Highest education level: Some college, no degree  Occupational History  . Occupation: [employer]  Tobacco Use  . Smoking status: Former Smoker    Years: 15.00  . Smokeless tobacco: Never Used  Vaping Use  . Vaping Use: Never used  Substance and Sexual Activity  . Alcohol use: Yes    Alcohol/week: 0.0 standard drinks    Comment: drinks red wine 2-3 times a week  . Drug use: No  . Sexual activity: Yes    Birth control/protection: I.U.D.    Comment: Paragard  Other Topics Concern  . Not on file  Social History Narrative   Lives at home w/ her husband and children; Left-handed; Drinks 1 cup of coffee per day. Lives in [place]; used to work for [employer]/ awaiting for disability [sec to stroke]; no smoking; rare alcohol/wine.    Social Determinants of Health   Financial Resource Strain:   . Difficulty of Paying Living Expenses:   Food Insecurity:   . Worried About Charity fundraiser in the Last Year:   . Arboriculturist in the Last Year:   Transportation Needs:   . Film/video editor (Medical):   Marland Kitchen Lack of Transportation (Non-Medical):   Physical Activity:   . Days of Exercise per Week:   . Minutes of Exercise per Session:   Stress:   . Feeling of Stress :   Social Connections:   . Frequency of Communication with Friends and Family:   . Frequency of Social Gatherings with Friends and Family:   . Attends Religious Services:   . Active Member of Clubs or Organizations:   . Attends Archivist Meetings:   Marland Kitchen Marital Status:   Intimate Partner Violence:   . Fear  of Current or Ex-Partner:   . Emotionally Abused:   Marland Kitchen Physically Abused:   . Sexually Abused:       Review of Systems  Constitutional: Negative for chills, fatigue and unexpected weight change.       Three pound weight loss since her most recent visit.   HENT: Negative for congestion, postnasal drip, rhinorrhea, sneezing and sore throat.   Respiratory: Negative for cough, chest tightness, shortness of breath and wheezing.   Cardiovascular: Negative for chest pain and palpitations.  Gastrointestinal: Negative for abdominal pain, constipation, diarrhea, nausea and vomiting.  Endocrine: Negative for cold intolerance, heat intolerance, polydipsia and polyuria.  Musculoskeletal: Negative for arthralgias, back pain, joint swelling  and neck pain.  Skin: Positive for rash.  Allergic/Immunologic: Negative for environmental allergies.  Neurological: Negative for dizziness, tremors, numbness and headaches.  Hematological: Negative for adenopathy. Does not bruise/bleed easily.  Psychiatric/Behavioral: Positive for dysphoric mood. Negative for behavioral problems (Depression), sleep disturbance and suicidal ideas. The patient is nervous/anxious.        The patient sees psychiatry on routine basis.    Today's Vitals   07/20/19 1150  BP: 134/72  Pulse: 78  Resp: 16  Temp: 97.6 F (36.4 C)  SpO2: 98%  Weight: 221 lb (100.2 kg)  Height: _0  (1.702 m)   Body mass index is 34.61 kg/m.  Physical Exam Vitals and nursing note reviewed.  Constitutional:      General: She is not in acute distress.    Appearance: Normal appearance. She is well-developed. She is not diaphoretic.  HENT:     Head: Normocephalic and atraumatic.     Nose: Nose normal.     Mouth/Throat:     Pharynx: No oropharyngeal exudate.  Eyes:     Pupils: Pupils are equal, round, and reactive to light.  Neck:     Thyroid: No thyromegaly.     Vascular: No carotid bruit or JVD.     Trachea: No tracheal deviation.   Cardiovascular:     Rate and Rhythm: Normal rate and regular rhythm.     Heart sounds: Normal heart sounds. No murmur heard.  No friction rub. No gallop.   Pulmonary:     Effort: Pulmonary effort is normal. No respiratory distress.     Breath sounds: Normal breath sounds. No wheezing or rales.  Chest:     Chest wall: No tenderness.  Abdominal:     Palpations: Abdomen is soft.  Musculoskeletal:        General: Normal range of motion.     Cervical back: Normal range of motion and neck supple.  Lymphadenopathy:     Cervical: No cervical adenopathy.  Skin:    General: Skin is warm and dry.  Neurological:     General: No focal deficit present.     Mental Status: She is alert and oriented to person, place, and time.     Cranial Nerves: No cranial nerve deficit.  Psychiatric:        Mood and Affect: Mood normal.        Behavior: Behavior normal.        Thought Content: Thought content normal.        Judgment: Judgment normal.   Assessment/Plan:  1. Iron deficiency Continue with iron supplement daily.  2. BMI 34.0-34.9,adult Improving. May continue to take phentermine 37.55m tablets, taking 1/2 to 1 tablet daily to help with weight management. Limit calorie intake to 1200 calories per day and incorporate exercise into daily routine.  - phentermine (ADIPEX-P) 37.5 MG tablet; Take 1 tablet (37.5 mg total) by mouth daily before breakfast.  Dispense: 30 tablet; Refill: 0  3. Bipolar disorder, in partial remission, most recent episode mixed ([place] Continue all medications as prescribed and regular visits with psychiatry as scheduled.    General Counseling: Rosalinda verbalizes understanding of the findings of todays visit and agrees with plan of treatment. I have discussed any further diagnostic evaluation that may be needed or ordered today. We also reviewed her medications today. she has been encouraged to call the office with any questions or concerns that should arise related to todays  visit.   This patient was seen by HLeretha PolFNP  Collaboration with Dr Lavera Guise as a part of collaborative care agreement  Meds ordered this encounter  Medications  . phentermine (ADIPEX-P) 37.5 MG tablet    Sig: Take 1 tablet (37.5 mg total) by mouth daily before breakfast.    Dispense:  30 tablet    Refill:  0    Order Specific Question:   Supervising Provider    Answer:   Lavera Guise [[id number]]    Total time spent: 25 Minutes   Time spent includes review of chart, medications, test results, and follow up plan with the patient.      Dr Lavera Guise Internal medicine

## 2019-07-20 NOTE — Telephone Encounter (Signed)
Confirmed appointment on 07/20/2019 and screened for covid. klh 

## 2019-08-12 ENCOUNTER — Other Ambulatory Visit: Payer: Self-pay | Admitting: Psychiatry

## 2019-08-12 DIAGNOSIS — F5105 Insomnia due to other mental disorder: Secondary | ICD-10-CM

## 2019-08-12 DIAGNOSIS — F410 Panic disorder [episodic paroxysmal anxiety] without agoraphobia: Secondary | ICD-10-CM

## 2019-08-21 ENCOUNTER — Encounter: Payer: Self-pay | Admitting: Psychiatry

## 2019-08-21 ENCOUNTER — Other Ambulatory Visit: Payer: Self-pay

## 2019-08-21 ENCOUNTER — Telehealth (INDEPENDENT_AMBULATORY_CARE_PROVIDER_SITE_OTHER): Payer: 59 | Admitting: Psychiatry

## 2019-08-21 DIAGNOSIS — F3178 Bipolar disorder, in full remission, most recent episode mixed: Secondary | ICD-10-CM

## 2019-08-21 DIAGNOSIS — G2111 Neuroleptic induced parkinsonism: Secondary | ICD-10-CM

## 2019-08-21 DIAGNOSIS — F410 Panic disorder [episodic paroxysmal anxiety] without agoraphobia: Secondary | ICD-10-CM | POA: Diagnosis not present

## 2019-08-21 DIAGNOSIS — F5105 Insomnia due to other mental disorder: Secondary | ICD-10-CM | POA: Diagnosis not present

## 2019-08-21 DIAGNOSIS — T50905A Adverse effect of unspecified drugs, medicaments and biological substances, initial encounter: Secondary | ICD-10-CM

## 2019-08-21 DIAGNOSIS — R635 Abnormal weight gain: Secondary | ICD-10-CM

## 2019-08-21 MED ORDER — BENZTROPINE MESYLATE 0.5 MG PO TABS: 0.5000 mg | ORAL_TABLET | Freq: Every day | ORAL | 0 refills | Status: DC

## 2019-08-21 MED ORDER — LAMOTRIGINE 100 MG PO TABS: 100.0000 mg | ORAL_TABLET | Freq: Every day | ORAL | 0 refills | Status: DC

## 2019-08-21 MED ORDER — ESZOPICLONE 2 MG PO TABS: 2.0000 mg | ORAL_TABLET | Freq: Every evening | ORAL | 1 refills | Status: DC | PRN

## 2019-08-21 MED ORDER — TOPIRAMATE 25 MG PO TABS: 25.0000 mg | ORAL_TABLET | Freq: Every day | ORAL | 1 refills | Status: DC

## 2019-08-21 MED ORDER — ARIPIPRAZOLE 30 MG PO TABS: 30.0000 mg | ORAL_TABLET | Freq: Every day | ORAL | 0 refills | Status: DC

## 2019-08-21 NOTE — Progress Notes (Signed)
Provider Location : [facility] Patient Location : Home  Virtual Visit via Video Note  I connected with Brittney Duran on 08/21/19 at 10:30 AM EDT by a video enabled telemedicine application and verified that I am speaking with the correct person using two identifiers.   I discussed the limitations of evaluation and management by telemedicine and the availability of in person appointments. The patient expressed understanding and agreed to proceed.    I discussed the assessment and treatment plan with the patient. The patient was provided an opportunity to ask questions and all were answered. The patient agreed with the plan and demonstrated an understanding of the instructions.   The patient was advised to call back or seek an in-person evaluation if the symptoms worsen or if the condition fails to improve as anticipated.   [facility] MD OP Progress Note  08/21/2019 12:25 PM Brittney Duran  MRN:  [id number]  Chief Complaint:  Chief Complaint    Follow-up     HPI: Brittney Duran is a 49 year old female, African-American, married, unemployed, has applied for disability, lives in [place], has a history of bipolar disorder, panic attacks, insomnia, history of TIA was evaluated by telemedicine today.  Patient today reports she is currently making progress with regards to her mood symptoms.  She reports she feels as though her depressive episodes are farther apart than before.  That is an improvement.  She reports she has been trying to stay in the moment and practicing mindfulness.  She is compliant on medications.  She however is worried about weight gain side effects.  She has been trying to lose weight, going for walks and watching her diet however it does not seem to be helpful.  Patient reports sleep is excessive when she takes the Lunesta 3 mg.  Discussed reducing the dosage and she is interested in the same.  Patient denies any suicidality or homicidality.  She reports she continues to have  visual hallucinations of seeing shadows and images out of the corner of her eyes however she has been coping better with it.  She continues to follow-up with Ms. Miguel Dibble and she reports her therapist also believes she is making progress.  She has has been seeing her every 3 to 4 weeks or so.  Patient reports she currently is struggling with possible food allergy and just took a dosage of Benadryl which does make her sluggish this morning.  She reports she had take out from a [employer] and she broke out however it is getting better.    Visit Diagnosis:    ICD-10-CM   1. Bipolar disorder, in full remission, most recent episode mixed (HCC)  F31.78 lamoTRIgine (LAMICTAL) 100 MG tablet  2. Panic disorder  F41.0   3. 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 Weight gain due to medication  R63.5 topiramate (TOPAMAX) 25 MG tablet   T50.905A     Past Psychiatric History: I have reviewed past psychiatric history from my progress note on 04/18/2017.  Past Medical History:  Past Medical History:  Diagnosis Date   Anemia    Anxiety    Panic attacks   Complication of anesthesia    pt reports "local" wears off quickly   Depression    Environmental allergies    Hemorrhoids    Leaky heart valve  Sciatica 09/08/2017   Wears contact lenses     Past Surgical History:  Procedure Laterality Date   Caesaran section  1989   COLONOSCOPY WITH PROPOFOL N/A 10/07/2014   Procedure: COLONOSCOPY WITH PROPOFOL;  Surgeon: Lucilla Lame, MD;  Location: [place];  Service: Endoscopy;  Laterality: N/A;  Latex   HERNIA REPAIR  1978    Family Psychiatric History: I have reviewed family psychiatric history from my progress note on 04/18/2017.  Family History:  Family History  Problem Relation Age of Onset    Hypertension Mother    Seizures Brother    Pancreatic cancer Other        pat- aunt.     Social History: I have reviewed social history from my progress note on 04/18/2017. Social History   Socioeconomic History   Marital status: Married    Spouse name: duglaus Matera   Number of children: 5   Years of education: 16   Highest education level: Some college, no degree  Occupational History   Occupation: [employer]  Tobacco Use   Smoking status: Former Smoker    Years: 15.00   Smokeless tobacco: Never Used  Scientific laboratory technician Use: Never used  Substance and Sexual Activity   Alcohol use: Yes    Alcohol/week: 0.0 standard drinks    Comment: drinks red wine 2-3 times a week   Drug use: No   Sexual activity: Yes    Birth control/protection: I.U.D.    Comment: Paragard  Other Topics Concern   Not on file  Social History Narrative   Lives at home w/ her husband and children; Left-handed; Drinks 1 cup of coffee per day. Lives in [place]; used to work for [employer]/ awaiting for disability [sec to stroke]; no smoking; rare alcohol/wine.    Social Determinants of Health   Financial Resource Strain:    Difficulty of Paying Living Expenses:   Food Insecurity:    Worried About Charity fundraiser in the Last Year:    Arboriculturist in the Last Year:   Transportation Needs:    Film/video editor (Medical):    Lack of Transportation (Non-Medical):   Physical Activity:    Days of Exercise per Week:    Minutes of Exercise per Session:   Stress:    Feeling of Stress :   Social Connections:    Frequency of Communication with Friends and Family:    Frequency of Social Gatherings with Friends and Family:    Attends Religious Services:    Active Member of Clubs or Organizations:    Attends Archivist Meetings:    Marital Status:     Allergies:  Allergies  Allergen Reactions   Lithium Swelling    Face swelling  Face swelling   Other  Swelling    lips   Penicillins Other (See Comments)    Unknown reaction Has patient had a PCN reaction causing immediate rash, facial/tongue/throat swelling, SOB or lightheadedness with hypotension: YES Has patient had a PCN reaction causing severe rash involving mucus membranes or skin necrosis: NO Has patient had a PCN reaction that required hospitalizationNO Has patient had a PCN reaction occurring within the last 10 years: NO If all of the above answers are "NO", then may proceed with Cephalosporin use.   Shellfish Allergy Swelling    lips   Latex Rash    Metabolic Disorder Labs: Lab Results  Component Value Date   HGBA1C 5.4 05/09/2015   MPG 108  05/09/2015   No results found for: PROLACTIN Lab Results  Component Value Date   CHOL 128 05/09/2015   TRIG 79 05/09/2015   HDL 41 05/09/2015   CHOLHDL 3.1 05/09/2015   VLDL 16 05/09/2015   LDLCALC 71 05/09/2015   Lab Results  Component Value Date   TSH 2.093 08/26/2017    Therapeutic Level Labs: No results found for: LITHIUM No results found for: VALPROATE No components found for:  CBMZ  Current Medications: Current Outpatient Medications  Medication Sig Dispense Refill   ARIPiprazole (ABILIFY) 30 MG tablet Take 1 tablet (30 mg total) by mouth daily. 90 tablet 0   aspirin 81 MG tablet Take 162 mg by mouth daily.     benztropine (COGENTIN) 0.5 MG tablet Take 1 tablet (0.5 mg total) by mouth at bedtime. 90 tablet 0   buPROPion (WELLBUTRIN XL) 150 MG 24 hr tablet TAKE 1 TABLET BY MOUTH EVERY DAY WITH BREAKFAST 90 tablet 0   cetirizine-pseudoephedrine (ZYRTEC-D) 5-120 MG per tablet Take 1 tablet by mouth 2 (two) times daily as needed for allergies.     Cholecalciferol (VITAMIN D3) 5000 units CAPS Take 1 capsule by mouth daily.     ciprofloxacin (CIPRO) 500 MG tablet Take 1 tablet (500 mg total) by mouth 2 (two) times daily. 6 tablet 0   eszopiclone (LUNESTA) 2 MG TABS tablet Take 1 tablet (2 mg total) by mouth at  bedtime as needed for sleep. Take immediately before bedtime 30 tablet 1   ferrous sulfate 325 (65 FE) MG tablet Take 325 mg by mouth daily with breakfast.     hydrOXYzine (VISTARIL) 25 MG capsule TAKE 1 CAPSULE (25 MG TOTAL) BY MOUTH 2 (TWO) TIMES DAILY AS NEEDED. FOR ANXIETY ATTACKS 180 capsule 1   ibuprofen (ADVIL,MOTRIN) 800 MG tablet Take 1 tablet (800 mg total) by mouth every 8 (eight) hours as needed for moderate pain. 15 tablet 0   lamoTRIgine (LAMICTAL) 100 MG tablet Take 1 tablet (100 mg total) by mouth daily. 90 tablet 0   PARAGARD INTRAUTERINE COPPER IUD IUD 1 each by Intrauterine route once.     phentermine (ADIPEX-P) 37.5 MG tablet Take 1 tablet (37.5 mg total) by mouth daily before breakfast. 30 tablet 0   prazosin (MINIPRESS) 1 MG capsule TAKE 1 CAPSULE (1 MG TOTAL) BY MOUTH AT BEDTIME. NIGHT MARES 90 capsule 1   SUPREP BOWEL PREP KIT 17.5-3.13-1.6 GM/177ML SOLN      topiramate (TOPAMAX) 25 MG tablet Take 1 tablet (25 mg total) by mouth at bedtime. 30 tablet 1   triamcinolone (KENALOG) 0.025 % cream Apply 1 application topically 2 (two) times daily. 80 g 2   valACYclovir (VALTREX) 1000 MG tablet Take 1 tablet (1,000 mg total) by mouth 2 (two) times daily. 14 tablet 0   vitamin B-12 (CYANOCOBALAMIN) 1000 MCG tablet Take 1,000 mcg by mouth daily as needed (energy). Reported on 05/08/2015     No current facility-administered medications for this visit.     Musculoskeletal: Strength & Muscle Tone: UTA Gait & Station: normal Patient leans: N/A  Psychiatric Specialty Exam: Review of Systems  Psychiatric/Behavioral: Positive for dysphoric mood and hallucinations. The patient is nervous/anxious.   All other systems reviewed and are negative.   There were no vitals taken for this visit.There is no height or weight on file to calculate BMI.  General Appearance: Casual  Eye Contact:  Fair  Speech:  Clear and Coherent  Volume:  Normal  Mood:  Anxious and Dysphoric  improving  Affect:  Congruent  Thought Process:  Goal Directed and Descriptions of Associations: Intact  Orientation:  Full (Time, Place, and Person)  Thought Content: Hallucinations: Visual sees shadows  Suicidal Thoughts:  No  Homicidal Thoughts:  No  Memory:  Immediate;   Fair Recent;   Fair Remote;   Fair  Judgement:  Fair  Insight:  Fair  Psychomotor Activity:  Normal  Concentration:  Concentration: Fair and Attention Span: Fair  Recall:  [employer] of Knowledge: Fair  Language: Fair  Akathisia:  No  Handed:  Right  AIMS (if indicated): UTA  Assets:  Communication Skills Desire for Improvement Housing Intimacy Social Support  ADL's:  Intact  Cognition: WNL  Sleep:  Excessive    Screenings: AUDIT     Office Visit from 06/12/2019 in [facility], [facility]  Alcohol Use Disorder Identification Test Final Score (AUDIT) 3    PHQ2-9     Office Visit from 06/12/2019 in [facility], [facility] Office Visit from 03/24/2018 in [facility], [facility] Office Visit from 09/09/2017 in [facility], [facility] Office Visit from 08/26/2017 in [facility], [facility]  PHQ-2 Total Score 4 0 4 6  PHQ-9 Total Score 18 -- 15 21       Assessment and Plan: Brittney Duran is a 49 year old African-American female who has a history of bipolar disorder, panic attacks, insomnia, history of trauma was evaluated by telemedicine today.  Patient is biologically predisposed due to her multiple health problems.  Patient with psychosocial stressors of the pandemic, financial problems, pending disability hearing.  She is currently worried about adverse side effects of weight gain due to Abilify, she will benefit from medication readjustment.  She is currently making progress with regards to her mood symptoms and will continue to benefit from medication management and psychotherapy sessions.  Plan as noted below.  Plan Bipolar disorder in full remission Abilify 30 mg p.o.  daily Lamictal 100 mg p.o. daily Wellbutrin XL 150 mg p.o. daily Continue CBT with Ms. Miguel Dibble  Panic attacks-stable Continue CBT Hydroxyzine 25 mg p.o. twice daily as needed  Insomnia-unstable-currently has excessive sedation on the Lunesta. Reduce Lunesta to 2 mg p.o. nightly as needed Prazosin 1 mg p.o. nightly for nightmares I have reviewed [place] controlled substance database  Neuroleptic induced parkinsonian syndrome-stable Continue Cogentin 0.5 mg p.o. nightly as needed  Weight gain secondary to antipsychotics-unstable We will continue Abilify at the same dosage right now.  Long-term plan will be to taper her off. Will add Topamax 25 mg p.o. daily for weight gain   Patient will benefit from lipid panel, hemoglobin A1c, prolactin, TSH as she will get it from her primary care provider.  She will fax the report to [place].  Patient to continue CBT.  Provided supportive psychotherapy, discussed mindfulness.  Follow-up in clinic in 4 to 5 weeks or sooner if needed.  I have spent atleast 20 minutes non face to face with patient today. More than 50 % of the time was spent for preparing to see the patient ( e.g., review of test, records ),ordering medications and test ,psychoeducation and supportive psychotherapy and care coordination,as well as documenting clinical information in electronic health record. This note was generated in part or whole with voice recognition software. Voice recognition is usually quite accurate but there are transcription errors that can and very often do occur. I apologize for any typographical errors that were not detected and corrected.  Ursula Alert, MD 08/21/2019, 12:25 PM

## 2019-08-28 ENCOUNTER — Telehealth: Payer: Self-pay

## 2019-08-28 NOTE — Telephone Encounter (Signed)
Lmom to confirm and screen for 08-31-19 ov. 

## 2019-08-31 ENCOUNTER — Encounter: Payer: Self-pay | Admitting: Nurse Practitioner

## 2019-08-31 ENCOUNTER — Ambulatory Visit: Payer: 59 | Admitting: Nurse Practitioner

## 2019-08-31 ENCOUNTER — Other Ambulatory Visit: Payer: Self-pay

## 2019-08-31 ENCOUNTER — Other Ambulatory Visit
Admission: RE | Admit: 2019-08-31 | Discharge: 2019-08-31 | Disposition: A | Discharge status: Home / Self Care | Payer: 59 | Attending: Nurse Practitioner | Admitting: Nurse Practitioner

## 2019-08-31 VITALS — BP 128/71 | HR 80 | Temp 97.9°F | Resp 16 | Ht 67.0 in | Wt 221.6 lb

## 2019-08-31 DIAGNOSIS — E559 Vitamin D deficiency, unspecified: Secondary | ICD-10-CM | POA: Insufficient documentation

## 2019-08-31 DIAGNOSIS — Z0001 Encounter for general adult medical examination with abnormal findings: Secondary | ICD-10-CM | POA: Diagnosis present

## 2019-08-31 DIAGNOSIS — R5383 Other fatigue: Secondary | ICD-10-CM

## 2019-08-31 DIAGNOSIS — D509 Iron deficiency anemia, unspecified: Secondary | ICD-10-CM | POA: Insufficient documentation

## 2019-08-31 DIAGNOSIS — E611 Iron deficiency: Secondary | ICD-10-CM

## 2019-08-31 DIAGNOSIS — Z6834 Body mass index (BMI) 34.0-34.9, adult: Secondary | ICD-10-CM | POA: Diagnosis not present

## 2019-08-31 LAB — CBC WITH DIFFERENTIAL/PLATELET
Abs Immature Granulocytes: 0.01 10*3/uL (ref 0.00–0.07)
Basophils Absolute: 0 10*3/uL (ref 0.0–0.1)
Basophils Relative: 1 %
Eosinophils Absolute: 0.2 10*3/uL (ref 0.0–0.5)
Eosinophils Relative: 3 %
HCT: 38.8 % (ref 36.0–46.0)
Hemoglobin: 12.6 g/dL (ref 12.0–15.0)
Immature Granulocytes: 0 %
Lymphocytes Relative: 28 %
Lymphs Abs: 1.4 10*3/uL (ref 0.7–4.0)
MCH: 29.2 pg (ref 26.0–34.0)
MCHC: 32.5 g/dL (ref 30.0–36.0)
MCV: 90 fL (ref 80.0–100.0)
Monocytes Absolute: 0.3 10*3/uL (ref 0.1–1.0)
Monocytes Relative: 6 %
Neutro Abs: 3.2 10*3/uL (ref 1.7–7.7)
Neutrophils Relative %: 62 %
Platelets: 234 10*3/uL (ref 150–400)
RBC: 4.31 MIL/uL (ref 3.87–5.11)
RDW: 12.5 % (ref 11.5–15.5)
WBC: 5 10*3/uL (ref 4.0–10.5)
nRBC: 0 % (ref 0.0–0.2)

## 2019-08-31 LAB — IRON AND TIBC
Iron: 113 ug/dL (ref 28–170)
Saturation Ratios: 35 % — ABNORMAL HIGH (ref 10.4–31.8)
TIBC: 321 ug/dL (ref 250–450)
UIBC: 208 ug/dL

## 2019-08-31 LAB — LIPID PANEL
Cholesterol: 160 mg/dL (ref 0–200)
HDL: 42 mg/dL (ref >40)
LDL Cholesterol: 107 mg/dL — ABNORMAL HIGH (ref 0–99)
Total CHOL/HDL Ratio: 3.8 RATIO
Triglycerides: 54 mg/dL (ref <150)
VLDL: 11 mg/dL (ref 0–40)

## 2019-08-31 LAB — TSH: TSH: 3.003 u[IU]/mL (ref 0.350–4.500)

## 2019-08-31 LAB — COMPREHENSIVE METABOLIC PANEL
ALT: 12 U/L (ref 0–44)
AST: 16 U/L (ref 15–41)
Albumin: 4.3 g/dL (ref 3.5–5.0)
Alkaline Phosphatase: 41 U/L (ref 38–126)
Anion gap: 6 (ref 5–15)
BUN: 9 mg/dL (ref 6–20)
CO2: 26 mmol/L (ref 22–32)
Calcium: 9.4 mg/dL (ref 8.9–10.3)
Chloride: 105 mmol/L (ref 98–111)
Creatinine, Ser: 0.62 mg/dL (ref 0.44–1.00)
GFR calc Af Amer: >60 mL/min (ref >60)
GFR calc non Af Amer: >60 mL/min (ref >60)
Glucose, Bld: 99 mg/dL (ref 70–99)
Potassium: 4 mmol/L (ref 3.5–5.1)
Sodium: 137 mmol/L (ref 135–145)
Total Bilirubin: 1.1 mg/dL (ref 0.3–1.2)
Total Protein: 7.1 g/dL (ref 6.5–8.1)

## 2019-08-31 LAB — VITAMIN B12: Vitamin B-12: 937 pg/mL — ABNORMAL HIGH (ref 180–914)

## 2019-08-31 LAB — FOLATE: Folate: 26 ng/mL (ref >5.9)

## 2019-08-31 LAB — T4, FREE: Free T4: 0.86 ng/dL (ref 0.61–1.12)

## 2019-08-31 LAB — VITAMIN D 25 HYDROXY (VIT D DEFICIENCY, FRACTURES): Vit D, 25-Hydroxy: 27.01 ng/mL — ABNORMAL LOW (ref 30–100)

## 2019-08-31 LAB — LACTATE DEHYDROGENASE: LDH: 129 U/L (ref 98–192)

## 2019-08-31 NOTE — Progress Notes (Signed)
[facility] [place], [place] [address]  Internal MEDICINE  Office Visit Note  Patient Name: Brittney Duran  [id number]  [id number]  Date of Service: 09/16/2019  Chief Complaint  Patient presents with  . Follow-up    pt wants to discuss new med topiramate and check on cholesterol  . Depression  . Quality Metric Gaps    TDAP    The patient is here for routine follow up. She had been taking phentermine 1/2 tablet daily to help with weight management. Recently saw her psychiatrist. Was started on Topamax to help manage mood disorder and to help weight control. The patient does have long history of severe depression and bipolar disorder. Sometimes, she does have psychosis related to this condition. The patient has tolerated the addition of the Topamax well. Will have her wean off and discontinue phentermine at this time. She is in agreement with this plan.       Current Medication: Outpatient Encounter Medications as of 08/31/2019  Medication Sig Note  . ARIPiprazole (ABILIFY) 30 MG tablet Take 1 tablet (30 mg total) by mouth daily.   Marland Kitchen aspirin 81 MG tablet Take 162 mg by mouth daily.   . benztropine (COGENTIN) 0.5 MG tablet Take 1 tablet (0.5 mg total) by mouth at bedtime.   Marland Kitchen buPROPion (WELLBUTRIN XL) 150 MG 24 hr tablet TAKE 1 TABLET BY MOUTH EVERY DAY WITH BREAKFAST   . cetirizine-pseudoephedrine (ZYRTEC-D) 5-120 MG per tablet Take 1 tablet by mouth 2 (two) times daily as needed for allergies.   . Cholecalciferol (VITAMIN D3) 5000 units CAPS Take 1 capsule by mouth daily.   . eszopiclone (LUNESTA) 2 MG TABS tablet Take 1 tablet (2 mg total) by mouth at bedtime as needed for sleep. Take immediately before bedtime   . ferrous sulfate 325 (65 FE) MG tablet Take 325 mg by mouth daily with breakfast.   . hydrOXYzine (VISTARIL) 25 MG capsule TAKE 1 CAPSULE (25 MG TOTAL) BY MOUTH 2 (TWO) TIMES DAILY AS NEEDED. FOR ANXIETY ATTACKS   . ibuprofen (ADVIL,MOTRIN) 800  MG tablet Take 1 tablet (800 mg total) by mouth every 8 (eight) hours as needed for moderate pain.   Marland Kitchen lamoTRIgine (LAMICTAL) 100 MG tablet Take 1 tablet (100 mg total) by mouth daily.   Marland Kitchen PARAGARD INTRAUTERINE COPPER IUD IUD 1 each by Intrauterine route once.   . prazosin (MINIPRESS) 1 MG capsule TAKE 1 CAPSULE (1 MG TOTAL) BY MOUTH AT BEDTIME. NIGHT MARES   . topiramate (TOPAMAX) 25 MG tablet Take 1 tablet (25 mg total) by mouth at bedtime.   . vitamin B-12 (CYANOCOBALAMIN) 1000 MCG tablet Take 1,000 mcg by mouth daily as needed (energy). Reported on 05/08/2015   . zinc gluconate 50 MG tablet Take 50 mg by mouth daily.   . [DISCONTINUED] ciprofloxacin (CIPRO) 500 MG tablet Take 1 tablet (500 mg total) by mouth 2 (two) times daily.   . [DISCONTINUED] ferrous sulfate 325 (65 FE) MG tablet Take 325 mg by mouth daily with breakfast.   . [DISCONTINUED] phentermine (ADIPEX-P) 37.5 MG tablet Take 1 tablet (37.5 mg total) by mouth daily before breakfast. 08/31/2019: patient's psychiatrist started her on topamax to help with weight loss.  . [DISCONTINUED] SUPREP BOWEL PREP KIT 17.5-3.13-1.6 GM/177ML SOLN    . [DISCONTINUED] triamcinolone (KENALOG) 0.025 % cream Apply 1 application topically 2 (two) times daily.   . [DISCONTINUED] valACYclovir (VALTREX) 1000 MG tablet Take 1 tablet (1,000 mg total) by mouth 2 (two)  times daily.    No facility-administered encounter medications on file as of 08/31/2019.    Surgical History: Past Surgical History:  Procedure Laterality Date  . Caesaran section  1989  . COLONOSCOPY WITH PROPOFOL N/A 10/07/2014   Procedure: COLONOSCOPY WITH PROPOFOL;  Surgeon: Darren Wohl, MD;  Location: [facility];  Service: Endoscopy;  Laterality: N/A;  Latex  . HERNIA REPAIR  1978    Medical History: Past Medical History:  Diagnosis Date  . Anemia   . Anxiety    Panic attacks  . Complication of anesthesia    pt reports "local" wears off quickly  . Depression   .  Environmental allergies   . Hemorrhoids   . Leaky heart valve   . Sciatica 09/08/2017  . Stroke (HCC)   . Wears contact lenses     Family History: Family History  Problem Relation Age of Onset  . Hypertension Mother   . Seizures Brother   . Pancreatic cancer Other        pat- aunt.     Social History   Socioeconomic History  . Marital status: Married    Spouse name: duglaus Sole  . Number of children: 5  . Years of education: 16  . Highest education level: Some college, no degree  Occupational History  . Occupation: [employer]  Tobacco Use  . Smoking status: Former Smoker    Years: 15.00  . Smokeless tobacco: Never Used  . Tobacco comment: stopped at age 24 yro  Vaping Use  . Vaping Use: Never used  Substance and Sexual Activity  . Alcohol use: Yes    Alcohol/week: 0.0 standard drinks    Comment: drinks red wine 2-3 times a week  . Drug use: No  . Sexual activity: Yes    Birth control/protection: I.U.D.    Comment: Paragard  Other Topics Concern  . Not on file  Social History Narrative   Lives at home w/ her husband and children; Left-handed; Drinks 1 cup of coffee per day. Lives in [place]; used to work for [employer]/ awaiting for disability [sec to stroke]; no smoking; rare alcohol/wine.    Social Determinants of Health   Financial Resource Strain:   . Difficulty of Paying Living Expenses: Not on file  Food Insecurity:   . Worried About Running Out of Food in the Last Year: Not on file  . Ran Out of Food in the Last Year: Not on file  Transportation Needs:   . Lack of Transportation (Medical): Not on file  . Lack of Transportation (Non-Medical): Not on file  Physical Activity:   . Days of Exercise per Week: Not on file  . Minutes of Exercise per Session: Not on file  Stress:   . Feeling of Stress : Not on file  Social Connections:   . Frequency of Communication with Friends and Family: Not on file  . Frequency of Social Gatherings with Friends and  Family: Not on file  . Attends Religious Services: Not on file  . Active Member of Clubs or Organizations: Not on file  . Attends Club or Organization Meetings: Not on file  . Marital Status: Not on file  Intimate Partner Violence:   . Fear of Current or Ex-Partner: Not on file  . Emotionally Abused: Not on file  . Physically Abused: Not on file  . Sexually Abused: Not on file      Review of Systems  Constitutional: Negative for chills, fatigue and unexpected weight change.         Weight stable since her last visit.   HENT: Negative for congestion, postnasal drip, rhinorrhea, sneezing and sore throat.   Respiratory: Negative for cough, chest tightness, shortness of breath and wheezing.   Cardiovascular: Negative for chest pain and palpitations.  Gastrointestinal: Negative for abdominal pain, constipation, diarrhea, nausea and vomiting.  Endocrine: Negative for cold intolerance, heat intolerance, polydipsia and polyuria.  Musculoskeletal: Negative for arthralgias, back pain, joint swelling and neck pain.  Skin: Negative for rash.  Allergic/Immunologic: Negative for environmental allergies.  Neurological: Negative for dizziness, tremors, numbness and headaches.  Hematological: Negative for adenopathy. Does not bruise/bleed easily.  Psychiatric/Behavioral: Positive for dysphoric mood. Negative for behavioral problems (Depression), sleep disturbance and suicidal ideas. The patient is nervous/anxious.        The patient sees psychiatry on routine basis.     Today's Vitals   08/31/19 1141  BP: 128/71  Pulse: 80  Resp: 16  Temp: 97.9 F (36.6 C)  SpO2: 98%  Weight: 221 lb 9.6 oz (100.5 kg)  Height: 5' 7" (1.702 m)   Body mass index is 34.71 kg/m.  Physical Exam Vitals and nursing note reviewed.  Constitutional:      General: She is not in acute distress.    Appearance: Normal appearance. She is well-developed. She is not diaphoretic.  HENT:     Head: Normocephalic and  atraumatic.     Nose: Nose normal.     Mouth/Throat:     Pharynx: No oropharyngeal exudate.  Eyes:     Pupils: Pupils are equal, round, and reactive to light.  Neck:     Thyroid: No thyromegaly.     Vascular: No carotid bruit or JVD.     Trachea: No tracheal deviation.  Cardiovascular:     Rate and Rhythm: Normal rate and regular rhythm.     Heart sounds: Normal heart sounds. No murmur heard.  No friction rub. No gallop.   Pulmonary:     Effort: Pulmonary effort is normal. No respiratory distress.     Breath sounds: Normal breath sounds. No wheezing or rales.  Chest:     Chest wall: No tenderness.  Abdominal:     Palpations: Abdomen is soft.  Musculoskeletal:        General: Normal range of motion.     Cervical back: Normal range of motion and neck supple.  Lymphadenopathy:     Cervical: No cervical adenopathy.  Skin:    General: Skin is warm and dry.  Neurological:     General: No focal deficit present.     Mental Status: She is alert and oriented to person, place, and time.     Cranial Nerves: No cranial nerve deficit.  Psychiatric:        Behavior: Behavior normal.        Thought Content: Thought content normal.        Judgment: Judgment normal.    Assessment/Plan: 1. Iron deficiency Continue iron supplementation as before.   2. Other fatigue Likely due to iron deficiency and psychiatric condition. Continue to monitor.   3. BMI 34.0-34.9,adult D/c phentermine. Continue with topamax as prescribed per psychiatry. Monitor closely.   General Counseling: Kaimana verbalizes understanding of the findings of todays visit and agrees with plan of treatment. I have discussed any further diagnostic evaluation that may be needed or ordered today. We also reviewed her medications today. she has been encouraged to call the office with any questions or concerns that should arise related to todays visit.     This patient was seen by Leretha Pol FNP Collaboration with Dr Lavera Guise as a part of collaborative care agreement   Total time spent: 25 Minutes   Time spent includes review of chart, medications, test results, and follow up plan with the patient.      Dr Lavera Guise Internal medicine

## 2019-09-06 ENCOUNTER — Ambulatory Visit: Payer: 59 | Admitting: Hospice and Palliative Medicine

## 2019-09-06 ENCOUNTER — Encounter: Payer: Self-pay | Admitting: Hospice and Palliative Medicine

## 2019-09-06 ENCOUNTER — Other Ambulatory Visit: Payer: Self-pay

## 2019-09-06 DIAGNOSIS — R7989 Other specified abnormal findings of blood chemistry: Secondary | ICD-10-CM

## 2019-09-06 DIAGNOSIS — E611 Iron deficiency: Secondary | ICD-10-CM | POA: Diagnosis not present

## 2019-09-06 DIAGNOSIS — E559 Vitamin D deficiency, unspecified: Secondary | ICD-10-CM

## 2019-09-06 NOTE — Progress Notes (Signed)
[facility] [address] [place], [place] [address]  Internal MEDICINE  Office Visit Note  Patient Name: Brittney Duran  [id number]  [id number]  Date of Service: 09/09/2019  Chief Complaint  Patient presents with  . Follow-up    review labs    HPI Patient is here for routine follow-up to discuss results of her recent labs. Discussed that her Free T4 levels are on low end of normal, in the past levels have been low. Anemia panel normal, will need ferritin levels checked. Currently taking iron supplementation. Vitamin D levels are low, she is not currently taking any supplementation. No complaints today. BP well controlled.  Current Medication: Outpatient Encounter Medications as of 09/06/2019  Medication Sig  . ARIPiprazole (ABILIFY) 30 MG tablet Take 1 tablet (30 mg total) by mouth daily.  Marland Kitchen aspirin 81 MG tablet Take 162 mg by mouth daily.  . benztropine (COGENTIN) 0.5 MG tablet Take 1 tablet (0.5 mg total) by mouth at bedtime.  Marland Kitchen buPROPion (WELLBUTRIN XL) 150 MG 24 hr tablet TAKE 1 TABLET BY MOUTH EVERY DAY WITH BREAKFAST  . cetirizine-pseudoephedrine (ZYRTEC-D) 5-120 MG per tablet Take 1 tablet by mouth 2 (two) times daily as needed for allergies.  . Cholecalciferol (VITAMIN D3) 5000 units CAPS Take 1 capsule by mouth daily.  . eszopiclone (LUNESTA) 2 MG TABS tablet Take 1 tablet (2 mg total) by mouth at bedtime as needed for sleep. Take immediately before bedtime  . ferrous sulfate 325 (65 FE) MG tablet Take 325 mg by mouth daily with breakfast.  . hydrOXYzine (VISTARIL) 25 MG capsule TAKE 1 CAPSULE (25 MG TOTAL) BY MOUTH 2 (TWO) TIMES DAILY AS NEEDED. FOR ANXIETY ATTACKS  . ibuprofen (ADVIL,MOTRIN) 800 MG tablet Take 1 tablet (800 mg total) by mouth every 8 (eight) hours as needed for moderate pain.  Marland Kitchen lamoTRIgine (LAMICTAL) 100 MG tablet Take 1 tablet (100 mg total) by mouth daily.  Marland Kitchen PARAGARD INTRAUTERINE COPPER IUD IUD 1 each by Intrauterine route once.  .  prazosin (MINIPRESS) 1 MG capsule TAKE 1 CAPSULE (1 MG TOTAL) BY MOUTH AT BEDTIME. NIGHT MARES  . topiramate (TOPAMAX) 25 MG tablet Take 1 tablet (25 mg total) by mouth at bedtime.  . vitamin B-12 (CYANOCOBALAMIN) 1000 MCG tablet Take 1,000 mcg by mouth daily as needed (energy). Reported on 05/08/2015  . zinc gluconate 50 MG tablet Take 50 mg by mouth daily.   No facility-administered encounter medications on file as of 09/06/2019.    Surgical History: Past Surgical History:  Procedure Laterality Date  . Caesaran section  1989  . COLONOSCOPY WITH PROPOFOL N/A 10/07/2014   Procedure: COLONOSCOPY WITH PROPOFOL;  Surgeon: [name], MD;  Location: [facility];  Service: Endoscopy;  Laterality: N/A;  Latex  . HERNIA REPAIR  1978    Medical History: Past Medical History:  Diagnosis Date  . Anemia   . Anxiety    Panic attacks  . Complication of anesthesia    pt reports "local" wears off quickly  . Depression   . Environmental allergies   . Hemorrhoids   . Leaky heart valve   . Sciatica 09/08/2017  . Stroke (HCC)   . Wears contact lenses     Family History: Family History  Problem Relation Age of Onset  . Hypertension Mother   . Seizures Brother   . Pancreatic cancer Other        pat- aunt.     Social History   Socioeconomic History  .  Marital status: Married    Spouse name: duglaus Shallenberger  . Number of children: 5  . Years of education: 3  . Highest education level: Some college, no degree  Occupational History  . Occupation: [employer]  Tobacco Use  . Smoking status: Former Smoker    Years: 15.00  . Smokeless tobacco: Never Used  . Tobacco comment: stopped at age 75 yro  Vaping Use  . Vaping Use: Never used  Substance and Sexual Activity  . Alcohol use: Yes    Alcohol/week: 0.0 standard drinks    Comment: drinks red wine 2-3 times a week  . Drug use: No  . Sexual activity: Yes    Birth control/protection: I.U.D.    Comment: Paragard  Other Topics  Concern  . Not on file  Social History Narrative   Lives at home w/ her husband and children; Left-handed; Drinks 1 cup of coffee per day. Lives in [place]; used to work for [employer]/ awaiting for disability [sec to stroke]; no smoking; rare alcohol/wine.    Social Determinants of Health   Financial Resource Strain:   . Difficulty of Paying Living Expenses: Not on file  Food Insecurity:   . Worried About Programme researcher, broadcasting/film/video in the Last Year: Not on file  . Ran Out of Food in the Last Year: Not on file  Transportation Needs:   . Lack of Transportation (Medical): Not on file  . Lack of Transportation (Non-Medical): Not on file  Physical Activity:   . Days of Exercise per Week: Not on file  . Minutes of Exercise per Session: Not on file  Stress:   . Feeling of Stress : Not on file  Social Connections:   . Frequency of Communication with Friends and Family: Not on file  . Frequency of Social Gatherings with Friends and Family: Not on file  . Attends Religious Services: Not on file  . Active Member of Clubs or Organizations: Not on file  . Attends Banker Meetings: Not on file  . Marital Status: Not on file  Intimate Partner Violence:   . Fear of Current or Ex-Partner: Not on file  . Emotionally Abused: Not on file  . Physically Abused: Not on file  . Sexually Abused: Not on file   Review of Systems  Constitutional: Negative for chills, fatigue and unexpected weight change.  HENT: Negative for congestion, postnasal drip, rhinorrhea, sneezing and sore throat.   Respiratory: Negative for cough, chest tightness, shortness of breath and wheezing.   Cardiovascular: Negative for chest pain and palpitations.  Gastrointestinal: Negative for abdominal pain, constipation, diarrhea, nausea and vomiting.  Endocrine: Negative for cold intolerance, heat intolerance, polydipsia and polyuria.  Musculoskeletal: Negative for arthralgias, back pain, joint swelling and neck pain.   Allergic/Immunologic: Negative for environmental allergies.  Neurological: Negative for dizziness, tremors, numbness and headaches.  Hematological: Negative for adenopathy. Does not bruise/bleed easily.  Psychiatric/Behavioral:Negative for behavioral problems (Depression), sleep disturbance and suicidal ideas. The patient is nervous/anxious.        The patient sees psychiatry on routine basis.    Vital Signs: BP 114/78   Pulse 80   Temp (!) 97 F (36.1 C)   Resp 16   Ht 5\' 7"  (1.702 m)   Wt 223 lb 6.4 oz (101.3 kg)   SpO2 99%   BMI 34.99 kg/m   Physical Exam Vitals reviewed.  Constitutional:      Appearance: Normal appearance. She is normal weight.  HENT:  Nose: Nose normal.     Mouth/Throat:     Mouth: Mucous membranes are moist.     Pharynx: Oropharynx is clear.  Cardiovascular:     Rate and Rhythm: Normal rate and regular rhythm.     Pulses: Normal pulses.     Heart sounds: Normal heart sounds.  Pulmonary:     Effort: Pulmonary effort is normal.     Breath sounds: Normal breath sounds.  Abdominal:     General: Abdomen is flat.     Palpations: Abdomen is soft.  Musculoskeletal:        General: Normal range of motion.     Cervical back: Normal range of motion.  Skin:    General: Skin is warm.  Neurological:     General: No focal deficit present.     Mental Status: She is alert and oriented to person, place, and time. Mental status is at baseline.  Psychiatric:        Mood and Affect: Mood normal.        Behavior: Behavior normal.        Thought Content: Thought content normal.    Assessment/Plan: 1. Abnormal thyroid blood test Free T4 levels barely normal at this check, last check T4 levels were low. Will have her repeat lab tests in 6 months to monitor levels. - TSH + free T4  2. Iron deficiency History of iron deficiency anemia, currently taking iron supplementation. Will add Ferritin levels to be reviewed. Continue with supplementation at this  time.  3. Vitamin D deficiency Encouraged to begin taking OTC vitamin D supplementation as well as calcium. Will continue monitoring levels.  General Counseling: Jalicia verbalizes understanding of the findings of todays visit and agrees with plan of treatment. I have discussed any further diagnostic evaluation that may be needed or ordered today. We also reviewed her medications today. she has been encouraged to call the office with any questions or concerns that should arise related to todays visit.    Orders Placed This Encounter  Procedures  . TSH + free T4   Time spent: 30 Minutes   This patient was seen by Leeanne Deed AGNP-C in Collaboration with Dr Lyndon Code as a part of collaborative care agreement     Lubertha Basque. Andray Assefa AGNP-C Internal medicine

## 2019-09-07 ENCOUNTER — Other Ambulatory Visit: Payer: Self-pay

## 2019-09-07 DIAGNOSIS — E611 Iron deficiency: Secondary | ICD-10-CM

## 2019-09-14 ENCOUNTER — Telehealth: Payer: Self-pay | Admitting: *Deleted

## 2019-09-14 ENCOUNTER — Inpatient Hospital Stay: Payer: 59

## 2019-09-14 ENCOUNTER — Inpatient Hospital Stay: Payer: 59 | Admitting: Internal Medicine

## 2019-09-14 NOTE — Telephone Encounter (Signed)
I spoke with patient. She will cnl her apts. Today. Colette, Please r/s for next week. Patient instructed that she needs to be retested on Monday.

## 2019-09-14 NOTE — Telephone Encounter (Signed)
Patient called reporting that she was exposed to COVID Sunday and that she had a Rapid Response COVID test done yesterday that was negative.

## 2019-09-17 ENCOUNTER — Other Ambulatory Visit: Payer: Self-pay | Admitting: Psychiatry

## 2019-09-17 DIAGNOSIS — T50905A Adverse effect of unspecified drugs, medicaments and biological substances, initial encounter: Secondary | ICD-10-CM

## 2019-09-17 DIAGNOSIS — R635 Abnormal weight gain: Secondary | ICD-10-CM

## 2019-10-01 ENCOUNTER — Other Ambulatory Visit: Payer: Self-pay

## 2019-10-01 DIAGNOSIS — E611 Iron deficiency: Secondary | ICD-10-CM

## 2019-10-02 ENCOUNTER — Inpatient Hospital Stay: Payer: 59 | Admitting: Internal Medicine

## 2019-10-02 ENCOUNTER — Inpatient Hospital Stay: Payer: 59

## 2019-10-09 ENCOUNTER — Telehealth (INDEPENDENT_AMBULATORY_CARE_PROVIDER_SITE_OTHER): Payer: 59 | Admitting: Psychiatry

## 2019-10-09 ENCOUNTER — Encounter: Payer: Self-pay | Admitting: Psychiatry

## 2019-10-09 ENCOUNTER — Other Ambulatory Visit: Payer: Self-pay

## 2019-10-09 DIAGNOSIS — F410 Panic disorder [episodic paroxysmal anxiety] without agoraphobia: Secondary | ICD-10-CM | POA: Diagnosis not present

## 2019-10-09 DIAGNOSIS — F3178 Bipolar disorder, in full remission, most recent episode mixed: Secondary | ICD-10-CM | POA: Insufficient documentation

## 2019-10-09 DIAGNOSIS — F5105 Insomnia due to other mental disorder: Secondary | ICD-10-CM | POA: Diagnosis not present

## 2019-10-09 DIAGNOSIS — F250 Schizoaffective disorder, bipolar type: Secondary | ICD-10-CM | POA: Diagnosis not present

## 2019-10-09 DIAGNOSIS — G2111 Neuroleptic induced parkinsonism: Secondary | ICD-10-CM

## 2019-10-09 NOTE — Progress Notes (Signed)
This note is not being shared with the patient for the following reason: To prevent harm (release of this note would result in harm to the life or physical safety of the patient or another).  Provider Location : [facility] Patient Location : Home  Participants: Patient , Provider  Virtual Visit via Video Note  I connected with Brittney Duran on 10/09/19 at  2:30 PM EDT by a video enabled telemedicine application and verified that I am speaking with the correct person using two identifiers.   I discussed the limitations of evaluation and management by telemedicine and the availability of in person appointments. The patient expressed understanding and agreed to proceed.    I discussed the assessment and treatment plan with the patient. The patient was provided an opportunity to ask questions and all were answered. The patient agreed with the plan and demonstrated an understanding of the instructions.   The patient was advised to call back or seek an in-person evaluation if the symptoms worsen or if the condition fails to improve as anticipated.  BH MD OP Progress Note  10/09/2019 3:58 PM NELLI SWALLEY  MRN:  [id number]  Chief Complaint:  Chief Complaint    Follow-up     HPI: Brittney Duran is a 49 year old female, African-American, married, unemployed, lives in [place], has a history of bipolar disorder, panic attacks, insomnia, history of TIA was evaluated by telemedicine today.  Patient today reports she is currently struggling with some hypomanic symptoms.  She however reports it is getting better.  She reports her symptoms may have started a week ago and it may have lasted 3 to 4 days.  She reports currently she is at a better place and does not struggle much.  She reports she had a discussion with therapist about some of her experiences as a child and that may have triggered that episode.  During that time she also went without sleep for couple of nights.  She currently reports her  mood symptoms as better.  She is compliant on her medications as prescribed except for her Topamax.  She reports she did not feel the Topamax is beneficial after she took it for 2 weeks and hence stopped taking it.  She continues to have auditory and visual hallucinations.  She however reports it does not happen all the time and is very random.  She reports she continues to see things out of the corner of her eyes on and off as well as recently heard a dog barking in her house when she does not have a pet.  She however reports it does not scare her and she is not interested in changing her medications.  Patient does report suicidal thoughts couple of weeks ago.  She reports at that time it may have been triggered by her grandmother visiting.  She reports she currently does not have any suicidal ideation or plan.  Even at that point she did not have a plan.  She reports she will never act on her thoughts and agrees to get help if she has any worsening thoughts.  She continues to be motivated to stay in therapy with Ms. Felecia Jan her therapist.  She reports her disability got declined and that could have added onto her stressors.  Patient denies any other concerns today.  Visit Diagnosis:    ICD-10-CM   1. Schizoaffective disorder, bipolar type (HCC)  F25.0   2. Panic disorder  F41.0   3. 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 Anemia   . Anxiety    Panic attacks  . Complication of anesthesia    pt reports "local" wears off quickly  . Depression   . Environmental allergies   . Hemorrhoids   . Leaky heart valve   . Sciatica 09/08/2017  . Stroke (HCC)   . Wears contact lenses     Past Surgical History:  Procedure Laterality Date  . Caesaran section  1989  .  COLONOSCOPY WITH PROPOFOL N/A 10/07/2014   Procedure: COLONOSCOPY WITH PROPOFOL;  Surgeon: [name], MD;  Location: [facility];  Service: Endoscopy;  Laterality: N/A;  Latex  . HERNIA REPAIR  1978    Family Psychiatric History: Reviewed family psychiatric history from my progress note on 04/18/2017  Family History:  Family History  Problem Relation Age of Onset  . Hypertension Mother   . Seizures Brother   . Pancreatic cancer Other        pat- aunt.     Social History: Reviewed social history from my progress note on 04/18/2017 Social History   Socioeconomic History  . Marital status: Married    Spouse name: duglaus Doubleday  . Number of children: 5  . Years of education: 41  . Highest education level: Some college, no degree  Occupational History  . Occupation: [employer]  Tobacco Use  . Smoking status: Former Smoker    Years: 15.00  . Smokeless tobacco: Never Used  . Tobacco comment: stopped at age 60 yro  Vaping Use  . Vaping Use: Never used  Substance and Sexual Activity  . Alcohol use: Yes    Alcohol/week: 0.0 standard drinks    Comment: drinks red wine 2-3 times a week  . Drug use: No  . Sexual activity: Yes    Birth control/protection: I.U.D.    Comment: Paragard  Other Topics Concern  . Not on file  Social History Narrative   Lives at home w/ her husband and children; Left-handed; Drinks 1 cup of coffee per day. Lives in [place]; used to work for [employer]/ awaiting for disability [sec to stroke]; no smoking; rare alcohol/wine.    Social Determinants of Health   Financial Resource Strain:   . Difficulty of Paying Living Expenses: Not on file  Food Insecurity:   . Worried About Programme researcher, broadcasting/film/video in the Last Year: Not on file  . Ran Out of Food in the Last Year: Not on file  Transportation Needs:   . Lack of Transportation (Medical): Not on file  . Lack of Transportation (Non-Medical): Not on file  Physical Activity:   . Days of Exercise per Week:  Not on file  . Minutes of Exercise per Session: Not on file  Stress:   . Feeling of Stress : Not on file  Social Connections:   . Frequency of Communication with Friends and Family: Not on file  . Frequency of Social Gatherings with Friends and Family: Not on file  . Attends Religious Services: Not on file  . Active Member of Clubs or Organizations: Not on file  . Attends Banker Meetings: Not on file  . Marital Status: Not on file    Allergies:  Allergies  Allergen Reactions  . Lithium Swelling    Face swelling  Face swelling  . Other Swelling    lips  . Penicillins Other (See Comments)    Unknown reaction Has patient had a PCN reaction causing immediate rash, facial/tongue/throat swelling, SOB or lightheadedness with hypotension: YES Has patient had a PCN reaction causing severe rash involving mucus membranes or skin necrosis: NO Has patient had a PCN reaction that required hospitalizationNO Has patient had a PCN reaction occurring within the last 10 years: NO If all of the above answers are "NO", then may proceed with Cephalosporin use.  . Shellfish Allergy Swelling    lips  . Latex Rash    Metabolic Disorder Labs: Lab Results  Component Value Date   HGBA1C 5.4 05/09/2015   MPG 108 05/09/2015   No results found for: PROLACTIN Lab Results  Component Value Date   CHOL 160 08/31/2019   TRIG 54 08/31/2019   HDL 42 08/31/2019   CHOLHDL 3.8 08/31/2019   VLDL 11 08/31/2019   LDLCALC 107 (H) 08/31/2019   LDLCALC 71 05/09/2015   Lab Results  Component Value Date   TSH 3.003 08/31/2019   TSH 2.093 08/26/2017    Therapeutic Level Labs: No results found for: LITHIUM No results found for: VALPROATE No components found for:  CBMZ  Current Medications: Current Outpatient Medications  Medication Sig Dispense Refill  . ARIPiprazole (ABILIFY) 30 MG tablet Take 1 tablet (30 mg total) by mouth daily. 90 tablet 0  . aspirin 81 MG tablet Take 162 mg by mouth  daily.    . benztropine (COGENTIN) 0.5 MG tablet Take 1 tablet (0.5 mg total) by mouth at bedtime. 90 tablet 0  . buPROPion (WELLBUTRIN XL) 150 MG 24 hr tablet TAKE 1 TABLET BY MOUTH EVERY DAY WITH BREAKFAST 90 tablet 0  . cetirizine-pseudoephedrine (ZYRTEC-D) 5-120 MG per tablet Take 1 tablet by mouth 2 (two) times daily as needed for allergies.    . Cholecalciferol (VITAMIN D3) 5000 units CAPS Take 1 capsule by mouth daily.    . eszopiclone (LUNESTA) 2 MG TABS tablet Take 1 tablet (2 mg total) by mouth at bedtime as needed for sleep. Take immediately before bedtime 30 tablet 1  . ferrous sulfate 325 (65 FE) MG tablet Take 325 mg by mouth daily with breakfast.    . hydrOXYzine (VISTARIL) 25 MG capsule TAKE 1 CAPSULE (25 MG TOTAL) BY MOUTH 2 (TWO) TIMES DAILY AS NEEDED. FOR ANXIETY ATTACKS 180 capsule 1  . ibuprofen (ADVIL,MOTRIN) 800 MG tablet Take 1 tablet (800 mg total) by mouth every 8 (eight) hours as needed for moderate pain. 15 tablet 0  . lamoTRIgine (LAMICTAL) 100 MG tablet Take 1 tablet (100 mg total) by mouth daily. 90 tablet 0  . PARAGARD INTRAUTERINE COPPER IUD IUD 1 each by Intrauterine route once.    . prazosin (MINIPRESS) 1 MG capsule TAKE 1 CAPSULE (1 MG TOTAL) BY MOUTH AT BEDTIME. NIGHT MARES 90 capsule 1  . triamcinolone (KENALOG) 0.025 % cream Apply topically.    . vitamin B-12 (CYANOCOBALAMIN) 1000 MCG tablet Take 1,000 mcg by mouth daily as needed (energy). Reported on 05/08/2015    . zinc gluconate 50 MG tablet Take 50 mg by mouth daily.     No current facility-administered medications for this visit.     Musculoskeletal: Strength & Muscle Tone: UTA Gait & Station: normal Patient leans: N/A  Psychiatric Specialty Exam: Review of Systems  Psychiatric/Behavioral: Positive for hallucinations. The patient is nervous/anxious.   All other systems reviewed and are negative.   There  were no vitals taken for this visit.There is no height or weight on file to calculate BMI.   General Appearance: Casual  Eye Contact:  Fair  Speech:  Clear and Coherent  Volume:  Normal  Mood:  Anxious coping   Affect:  Congruent  Thought Process:  Goal Directed and Descriptions of Associations: Intact  Orientation:  Full (Time, Place, and Person)  Thought Content: Hallucinations: Auditory Visual chronic - coping well- hear dog barking as well as sees images or shadows out of the corner of her eyes  Suicidal Thoughts:  No - does report recent SI , currently denies  Homicidal Thoughts:  No  Memory:  Immediate;   Fair Recent;   Fair Remote;   Fair  Judgement:  Fair  Insight:  Fair  Psychomotor Activity:  Normal  Concentration:  Concentration: Fair and Attention Span: Fair  Recall:  [employer] of Knowledge: Fair  Language: Fair  Akathisia:  No  Handed:  Right  AIMS (if indicated): UTA  Assets:  Communication Skills Desire for Improvement Social Support  ADL's:  Intact  Cognition: WNL  Sleep:  Fair   Screenings: AUDIT     Office Visit from 06/12/2019 in [facility], [facility]  Alcohol Use Disorder Identification Test Final Score (AUDIT) 3    PHQ2-9     Office Visit from 09/06/2019 in [facility], [facility] Office Visit from 08/31/2019 in [facility], [facility] Office Visit from 06/12/2019 in [facility], [facility] Office Visit from 03/24/2018 in [facility], [facility] Office Visit from 09/09/2017 in [facility], [facility]  PHQ-2 Total Score 0 4  PHQ-9 Total Score -- 16 18 -- 15       Assessment and Plan: JOLANA RUNKLES is a 49 year old African-American female who has a history of bipolar disorder, panic attacks, insomnia, history of trauma was evaluated by telemedicine today.  Patient is biologically predisposed due to multiple health problems.  Patient with psychosocial stressors of the pandemic, financial problems.  Patient with chronic hallucinations, auditory as well as visual however reports she is able to cope with it.   She also has a history of chronic self-injurious thoughts.  Risk factors for suicide-her mental health problems, recent suicidality.  Protective factors are that she denies any suicidal attempts, denies family history of suicide, denies any active suicidal thoughts or plan at this time, has young children at home, has good social support system, denies substance abuse problems, is motivated to stay in treatment. Acute risk for suicide is low.  Patient also struggling with mild hallucinations as well as current psychosocial stressors reports her mood symptoms are currently stable and she is able to cope with her hallucinations at this hence not interested in medication changes.  She wants to continue psychotherapy sessions on a more frequent basis.  Plan Schizoaffective disorder-stable Abilify 30 mg p.o. daily Lamictal 100 mg p.o. daily Wellbutrin XL 150 mg p.o. daily Continue CBT with Ms. Felecia Jan  Panic attacks-stable Continue CBT Hydroxyzine 25 mg p.o. twice daily as needed  Insomnia-stable Lunesta 2 mg p.o. nightly as needed Prazosin 1 mg p.o. nightly for nightmares I have reviewed [place] controlled substance database.  Neuroleptic induced Parkinson syndrome-stable Cogentin 0.5 mg p.o. nightly as needed  Pending labs-hemoglobin A1c, lipid panel, prolactin, TSH.  She will get it from her primary care provider.  Patient to continue CBT on a more frequent basis.  Follow-up in clinic in 4 to 5 weeks or sooner  if needed.  I have spent atleast 20 minutes face to face with patient today. More than 50 % of the time was spent for preparing to see the patient ( e.g., review of test, records ), ordering medications and test ,psychoeducation and supportive psychotherapy and care coordination,as well as documenting clinical information in electronic health record. This note was generated in part or whole with voice recognition software. Voice recognition is usually quite accurate but there are  transcription errors that can and very often do occur. I apologize for any typographical errors that were not detected and corrected.      Jomarie LongsSaramma Clelia Trabucco, MD 10/10/2019, 8:17 AM

## 2019-10-19 ENCOUNTER — Inpatient Hospital Stay: Payer: 59

## 2019-10-19 ENCOUNTER — Inpatient Hospital Stay: Payer: 59 | Attending: Internal Medicine | Admitting: Internal Medicine

## 2019-10-19 ENCOUNTER — Encounter: Payer: Self-pay | Admitting: Internal Medicine

## 2019-10-19 ENCOUNTER — Other Ambulatory Visit: Payer: Self-pay

## 2019-10-19 DIAGNOSIS — Z79899 Other long term (current) drug therapy: Secondary | ICD-10-CM | POA: Diagnosis not present

## 2019-10-19 DIAGNOSIS — G40909 Epilepsy, unspecified, not intractable, without status epilepticus: Secondary | ICD-10-CM | POA: Insufficient documentation

## 2019-10-19 DIAGNOSIS — R42 Dizziness and giddiness: Secondary | ICD-10-CM | POA: Insufficient documentation

## 2019-10-19 DIAGNOSIS — D509 Iron deficiency anemia, unspecified: Secondary | ICD-10-CM | POA: Insufficient documentation

## 2019-10-19 DIAGNOSIS — E611 Iron deficiency: Secondary | ICD-10-CM

## 2019-10-19 DIAGNOSIS — F4310 Post-traumatic stress disorder, unspecified: Secondary | ICD-10-CM | POA: Diagnosis not present

## 2019-10-19 DIAGNOSIS — Z8673 Personal history of transient ischemic attack (TIA), and cerebral infarction without residual deficits: Secondary | ICD-10-CM | POA: Diagnosis not present

## 2019-10-19 DIAGNOSIS — Z87891 Personal history of nicotine dependence: Secondary | ICD-10-CM | POA: Diagnosis not present

## 2019-10-19 DIAGNOSIS — R5383 Other fatigue: Secondary | ICD-10-CM | POA: Diagnosis not present

## 2019-10-19 DIAGNOSIS — Z8669 Personal history of other diseases of the nervous system and sense organs: Secondary | ICD-10-CM | POA: Diagnosis not present

## 2019-10-19 LAB — BASIC METABOLIC PANEL
Anion gap: 9 (ref 5–15)
BUN: 18 mg/dL (ref 6–20)
CO2: 23 mmol/L (ref 22–32)
Calcium: 8.9 mg/dL (ref 8.9–10.3)
Chloride: 106 mmol/L (ref 98–111)
Creatinine, Ser: 0.79 mg/dL (ref 0.44–1.00)
GFR calc Af Amer: >60 mL/min (ref >60)
GFR calc non Af Amer: >60 mL/min (ref >60)
Glucose, Bld: 98 mg/dL (ref 70–99)
Potassium: 4.1 mmol/L (ref 3.5–5.1)
Sodium: 138 mmol/L (ref 135–145)

## 2019-10-19 LAB — CBC WITH DIFFERENTIAL/PLATELET
Abs Immature Granulocytes: 0.02 10*3/uL (ref 0.00–0.07)
Basophils Absolute: 0 10*3/uL (ref 0.0–0.1)
Basophils Relative: 1 %
Eosinophils Absolute: 0.2 10*3/uL (ref 0.0–0.5)
Eosinophils Relative: 3 %
HCT: 36.6 % (ref 36.0–46.0)
Hemoglobin: 12.6 g/dL (ref 12.0–15.0)
Immature Granulocytes: 0 %
Lymphocytes Relative: 22 %
Lymphs Abs: 1.7 10*3/uL (ref 0.7–4.0)
MCH: 29.8 pg (ref 26.0–34.0)
MCHC: 34.4 g/dL (ref 30.0–36.0)
MCV: 86.5 fL (ref 80.0–100.0)
Monocytes Absolute: 0.5 10*3/uL (ref 0.1–1.0)
Monocytes Relative: 6 %
Neutro Abs: 5.5 10*3/uL (ref 1.7–7.7)
Neutrophils Relative %: 68 %
Platelets: 212 10*3/uL (ref 150–400)
RBC: 4.23 MIL/uL (ref 3.87–5.11)
RDW: 12.3 % (ref 11.5–15.5)
WBC: 8 10*3/uL (ref 4.0–10.5)
nRBC: 0 % (ref 0.0–0.2)

## 2019-10-19 LAB — IRON AND TIBC
Iron: 154 ug/dL (ref 28–170)
Saturation Ratios: 52 % — ABNORMAL HIGH (ref 10.4–31.8)
TIBC: 298 ug/dL (ref 250–450)
UIBC: 144 ug/dL

## 2019-10-19 LAB — FERRITIN: Ferritin: 45 ng/mL (ref 11–307)

## 2019-10-19 NOTE — Assessment & Plan Note (Addendum)
#   Anemia hemoglobin 11.3; FERRITIN-9 [[facility]] S/p IV iron infusion; no significant improvement in fatigue noted post infusion.   Today hemoglobin is 12.6.  Continue p.o. iron.  Recommend holding infusion today.  Iron studies pending.  If low would recommend infusion.  # Fatigue/dizziness- ? Etiology; sec to Psyche medications. UNLIKLEy sec to Iron deficiency anemia.  Defer to psychiatry.  # Family history of malignancy [stomach; pancreatic cancers]; discussed with patient. For genetic counseling-recommend genetic counseling; interested. Will make a referral.   # DISPOSITION: # HOLD Venofer # referral to genetics re: family hx of cancer # follow up in 6 month- MD; labs- cbc/bmp/ironstudies/feritin- possible venofer-Dr.B

## 2019-10-19 NOTE — Progress Notes (Signed)
Pt states she has been feeling dizzy, weak, more SOB and more fatigue recently.

## 2019-10-19 NOTE — Progress Notes (Signed)
[facility] CONSULT NOTE  Patient Care Team: Lyndon Code, MD as PCP - General (Internal Medicine)  CHIEF COMPLAINTS/PURPOSE OF CONSULTATION: Anemia.  HEMATOLOGY HISTORY  # ANEMIA colonoscopy-2016 [Dr.Wohl]; FERRITIN -9 [[facility]; [facility]]-hemoglobin 11.3 s/p IV iron infusion-no improvement of symptoms[fatigue and dizziness]  # ? Seizure [ Neurology; Stepp; [facility]]; history of stroke/bipolar/PTSD [followed by psychiatry]  HISTORY OF PRESENTING ILLNESS:  Brittney Duran 49 y.o.  female with low iron levels/mild anemia is here for follow-up.  Patient was recently evaluated by psychiatry.  Patient continues to complain of dizziness.  Continues to complain of extreme fatigue.  No fever no chills.  Review of Systems  Constitutional: Positive for malaise/fatigue. Negative for chills, diaphoresis, fever and weight loss.  HENT: Negative for nosebleeds and sore throat.   Eyes: Negative for double vision.  Respiratory: Negative for cough, hemoptysis, sputum production, shortness of breath and wheezing.   Cardiovascular: Negative for chest pain, palpitations, orthopnea and leg swelling.  Gastrointestinal: Negative for abdominal pain, blood in stool, constipation, diarrhea, heartburn, melena, nausea and vomiting.  Genitourinary: Positive for dysuria, frequency and urgency.  Musculoskeletal: Positive for joint pain. Negative for back pain.  Skin: Negative.  Negative for itching and rash.  Neurological: Negative for dizziness, tingling, focal weakness, weakness and headaches.  Endo/Heme/Allergies: Does not bruise/bleed easily.  Psychiatric/Behavioral: Negative for depression. The patient is not nervous/anxious and does not have insomnia.     MEDICAL HISTORY:  Past Medical History:  Diagnosis Date  . Anemia   . Anxiety    Panic attacks  . Complication of anesthesia    pt reports "local" wears off quickly  . Depression   . Environmental allergies   . Hemorrhoids   . Leaky  heart valve   . Sciatica 09/08/2017  . Stroke (HCC)   . Wears contact lenses     SURGICAL HISTORY: Past Surgical History:  Procedure Laterality Date  . Caesaran section  1989  . COLONOSCOPY WITH PROPOFOL N/A 10/07/2014   Procedure: COLONOSCOPY WITH PROPOFOL;  Surgeon: [name], MD;  Location: [facility];  Service: Endoscopy;  Laterality: N/A;  Latex  . HERNIA REPAIR  1978    SOCIAL HISTORY: Social History   Socioeconomic History  . Marital status: Married    Spouse name: duglaus Magill  . Number of children: 5  . Years of education: 53  . Highest education level: Some college, no degree  Occupational History  . Occupation: [employer]  Tobacco Use  . Smoking status: Former Smoker    Years: 15.00  . Smokeless tobacco: Never Used  . Tobacco comment: stopped at age 22 yro  Vaping Use  . Vaping Use: Never used  Substance and Sexual Activity  . Alcohol use: Yes    Alcohol/week: 0.0 standard drinks    Comment: drinks red wine 2-3 times a week  . Drug use: No  . Sexual activity: Yes    Birth control/protection: I.U.D.    Comment: Paragard  Other Topics Concern  . Not on file  Social History Narrative   Lives at home w/ her husband and children; Left-handed; Drinks 1 cup of coffee per day. Lives in [place]; used to work for [employer]/ awaiting for disability [sec to stroke]; no smoking; rare alcohol/wine.    Social Determinants of Health   Financial Resource Strain:   . Difficulty of Paying Living Expenses: Not on file  Food Insecurity:   . Worried About Programme researcher, broadcasting/film/video in the Last Year: Not on  file  . Ran Out of Food in the Last Year: Not on file  Transportation Needs:   . Lack of Transportation (Medical): Not on file  . Lack of Transportation (Non-Medical): Not on file  Physical Activity:   . Days of Exercise per Week: Not on file  . Minutes of Exercise per Session: Not on file  Stress:   . Feeling of Stress : Not on file  Social Connections:   .  Frequency of Communication with Friends and Family: Not on file  . Frequency of Social Gatherings with Friends and Family: Not on file  . Attends Religious Services: Not on file  . Active Member of Clubs or Organizations: Not on file  . Attends Banker Meetings: Not on file  . Marital Status: Not on file  Intimate Partner Violence:   . Fear of Current or Ex-Partner: Not on file  . Emotionally Abused: Not on file  . Physically Abused: Not on file  . Sexually Abused: Not on file    FAMILY HISTORY: Family History  Problem Relation Age of Onset  . Hypertension Mother   . Seizures Brother   . Pancreatic cancer Other        pat- aunt.     ALLERGIES:  is allergic to lithium, other, penicillins, shellfish allergy, and latex.  MEDICATIONS:  Current Outpatient Medications  Medication Sig Dispense Refill  . ARIPiprazole (ABILIFY) 30 MG tablet Take 1 tablet (30 mg total) by mouth daily. 90 tablet 0  . aspirin 81 MG tablet Take 162 mg by mouth daily.    . benztropine (COGENTIN) 0.5 MG tablet Take 1 tablet (0.5 mg total) by mouth at bedtime. 90 tablet 0  . buPROPion (WELLBUTRIN XL) 150 MG 24 hr tablet TAKE 1 TABLET BY MOUTH EVERY DAY WITH BREAKFAST 90 tablet 0  . cetirizine-pseudoephedrine (ZYRTEC-D) 5-120 MG per tablet Take 1 tablet by mouth 2 (two) times daily as needed for allergies.    . Cholecalciferol (VITAMIN D3) 5000 units CAPS Take 1 capsule by mouth daily.    . eszopiclone (LUNESTA) 2 MG TABS tablet Take 1 tablet (2 mg total) by mouth at bedtime as needed for sleep. Take immediately before bedtime 30 tablet 1  . ferrous sulfate 325 (65 FE) MG tablet Take 325 mg by mouth daily with breakfast.    . hydrOXYzine (VISTARIL) 25 MG capsule TAKE 1 CAPSULE (25 MG TOTAL) BY MOUTH 2 (TWO) TIMES DAILY AS NEEDED. FOR ANXIETY ATTACKS 180 capsule 1  . ibuprofen (ADVIL,MOTRIN) 800 MG tablet Take 1 tablet (800 mg total) by mouth every 8 (eight) hours as needed for moderate pain. 15 tablet  0  . lamoTRIgine (LAMICTAL) 100 MG tablet Take 1 tablet (100 mg total) by mouth daily. 90 tablet 0  . PARAGARD INTRAUTERINE COPPER IUD IUD 1 each by Intrauterine route once.    . prazosin (MINIPRESS) 1 MG capsule TAKE 1 CAPSULE (1 MG TOTAL) BY MOUTH AT BEDTIME. NIGHT MARES 90 capsule 1  . triamcinolone (KENALOG) 0.025 % cream Apply topically.    . vitamin B-12 (CYANOCOBALAMIN) 1000 MCG tablet Take 1,000 mcg by mouth daily as needed (energy). Reported on 05/08/2015    . zinc gluconate 50 MG tablet Take 50 mg by mouth daily.     No current facility-administered medications for this visit.      PHYSICAL EXAMINATION:   Vitals:   10/19/19 1311  BP: 119/61  Pulse: 77  Resp: 18  Temp: 98.1 F (36.7 C)  SpO2: 100%  Filed Weights   10/19/19 1311  Weight: 226 lb (102.5 kg)    Physical Exam HENT:     Head: Normocephalic and atraumatic.     Mouth/Throat:     Pharynx: No oropharyngeal exudate.  Eyes:     Pupils: Pupils are equal, round, and reactive to light.  Cardiovascular:     Rate and Rhythm: Normal rate and regular rhythm.  Pulmonary:     Effort: No respiratory distress.     Breath sounds: No wheezing.  Abdominal:     General: Bowel sounds are normal. There is no distension.     Palpations: Abdomen is soft. There is no mass.     Tenderness: There is no abdominal tenderness. There is no guarding or rebound.  Musculoskeletal:        General: No tenderness. Normal range of motion.     Cervical back: Normal range of motion and neck supple.  Skin:    General: Skin is warm.  Neurological:     Mental Status: She is alert and oriented to person, place, and time.  Psychiatric:        Mood and Affect: Affect normal.     LABORATORY DATA:  I have reviewed the data as listed Lab Results  Component Value Date   WBC 8.0 10/19/2019   HGB 12.6 10/19/2019   HCT 36.6 10/19/2019   MCV 86.5 10/19/2019   PLT 212 10/19/2019   Recent Labs    12/14/18 0159 08/31/19 1322  10/19/19 1256  NA 139 137 138  K 4.4 4.0 4.1  CL 109 105 106  CO2 22 26 23   GLUCOSE 104* 99 98  BUN 16 9 18   CREATININE 0.86 0.62 0.79  CALCIUM 9.7 9.4 8.9  GFRNONAA >60 >60 >60  GFRAA >60 >60 >60  PROT  --  7.1  --   ALBUMIN  --  4.3  --   AST  --  16  --   ALT  --  12  --   ALKPHOS  --  41  --   BILITOT  --  1.1  --      No results found.  Iron deficiency # Anemia hemoglobin 11.3; FERRITIN-9 [[facility]] S/p IV iron infusion; no significant improvement in fatigue noted post infusion.   Today hemoglobin is 12.6.  Continue p.o. iron.  Recommend holding infusion today.  # Fatigue/dizziness- ? Etiology; sec to Psyche medications. UNLIKLEy sec to Iron deficiency anemia.  Defer to psychiatry.  # Family history of malignancy [stomach; pancreatic cancers]; discussed with patient. For genetic counseling-recommend genetic counseling; interested. Will make a referral.   # DISPOSITION: # HOLD Venofer # referral to genetics re: family hx of cancer # follow up in 6 month- MD; labs- cbc/bmp/ironstudies/feritin- possible venofer-Dr.B   All questions were answered. The patient knows to call the clinic with any problems, questions or concerns.   , MD 10/19/2019 1:42 PM

## 2019-11-15 ENCOUNTER — Encounter: Payer: Self-pay | Admitting: Psychiatry

## 2019-11-15 ENCOUNTER — Telehealth (INDEPENDENT_AMBULATORY_CARE_PROVIDER_SITE_OTHER): Payer: 59 | Admitting: Psychiatry

## 2019-11-15 ENCOUNTER — Other Ambulatory Visit: Payer: Self-pay

## 2019-11-15 DIAGNOSIS — F5105 Insomnia due to other mental disorder: Secondary | ICD-10-CM

## 2019-11-15 DIAGNOSIS — F410 Panic disorder [episodic paroxysmal anxiety] without agoraphobia: Secondary | ICD-10-CM

## 2019-11-15 DIAGNOSIS — F250 Schizoaffective disorder, bipolar type: Secondary | ICD-10-CM | POA: Diagnosis not present

## 2019-11-15 DIAGNOSIS — G2111 Neuroleptic induced parkinsonism: Secondary | ICD-10-CM

## 2019-11-15 MED ORDER — HYDROXYZINE PAMOATE 25 MG PO CAPS: 25.0000 mg | ORAL_CAPSULE | Freq: Two times a day (BID) | ORAL | 1 refills | Status: DC | PRN

## 2019-11-15 MED ORDER — ESZOPICLONE 2 MG PO TABS: 2.0000 mg | ORAL_TABLET | Freq: Every evening | ORAL | 1 refills | Status: DC | PRN

## 2019-11-15 MED ORDER — ARIPIPRAZOLE 30 MG PO TABS: 30.0000 mg | ORAL_TABLET | Freq: Every day | ORAL | 0 refills | Status: DC

## 2019-11-15 MED ORDER — BENZTROPINE MESYLATE 0.5 MG PO TABS: 0.5000 mg | ORAL_TABLET | Freq: Every day | ORAL | 0 refills | Status: DC

## 2019-11-15 MED ORDER — BUPROPION HCL ER (XL) 150 MG PO TB24: ORAL_TABLET | ORAL | 0 refills | Status: DC

## 2019-11-15 MED ORDER — LAMOTRIGINE 100 MG PO TABS: 150.0000 mg | ORAL_TABLET | Freq: Every day | ORAL | 0 refills | Status: DC

## 2019-11-15 NOTE — Progress Notes (Signed)
Virtual Visit via Video Note  I connected with Brittney Duran on 11/15/19 at  9:30 AM EDT by a video enabled telemedicine application and verified that I am speaking with the correct person using two identifiers.  Location Provider Location : [facility] Patient Location : Home  Participants: Patient , Provider     I discussed the limitations of evaluation and management by telemedicine and the availability of in person appointments. The patient expressed understanding and agreed to proceed.    I discussed the assessment and treatment plan with the patient. The patient was provided an opportunity to ask questions and all were answered. The patient agreed with the plan and demonstrated an understanding of the instructions.   The patient was advised to call back or seek an in-person evaluation if the symptoms worsen or if the condition fails to improve as anticipated.   [facility] MD OP Progress Note  11/15/2019 1:57 PM GWENLYN Duran  MRN:  [id number]  Chief Complaint:  Chief Complaint    Follow-up     HPI: ALIJAH Duran is a 49 year old female, African-American, married, unemployed, lives in [place], has a history of schizoaffective disorder, panic attacks, insomnia, history of TIA was evaluated by telemedicine today.  Patient today reports mood wise she currently feels overwhelmed.  She reports it is mostly because of all the situational stress that she is experiencing.  She denies any hypomanic symptoms or manic episodes at this time.  She does have hopelessness on and off however denies any suicidal thoughts or plan at this time.  She reports sleep is overall good.  She continues to have shadows or images that she sees out of the corner of her eyes.  This has been going on for a very long time.  She is compliant on Abilify.  She denies any auditory hallucinations at this time.  Patient denies any homicidality.  She reports good support system from her family.  She was able to take a  trip to [place] recently with her mother.  Her mother also has been constantly involved in her care.  She reports she has dinner at her mother's home on a regular basis now.  She also spent time with her family at a nearby park recently which kind of helped.  Patient denies any other concerns today.  Visit Diagnosis:    ICD-10-CM   1. Schizoaffective disorder, bipolar type (HCC)  F25.0 lamoTRIgine (LAMICTAL) 100 MG tablet  2. Panic disorder  F41.0 buPROPion (WELLBUTRIN XL) 150 MG 24 hr tablet    hydrOXYzine (VISTARIL) 25 MG capsule  3. Insomnia due to mental condition  F51.05 ARIPiprazole (ABILIFY) 30 MG tablet    eszopiclone (LUNESTA) 2 MG TABS tablet  4. 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  Past Medical History:  Past Medical History:  Diagnosis Date  . Anemia   . Anxiety    Panic attacks  . Complication of anesthesia    pt reports "local" wears off quickly  . Depression   . Environmental allergies   . Hemorrhoids   . Leaky heart valve   . Sciatica 09/08/2017  . Stroke (HCC)   . Wears contact lenses     Past Surgical History:  Procedure Laterality Date  . Caesaran section  1989  . COLONOSCOPY WITH PROPOFOL N/A 10/07/2014   Procedure: COLONOSCOPY WITH PROPOFOL;  Surgeon: [name],  MD;  Location: [facility];  Service: Endoscopy;  Laterality: N/A;  Latex  . HERNIA REPAIR  1978    Family Psychiatric History: I have reviewed family psychiatric history from my progress note on 04/18/2017.  Family History:  Family History  Problem Relation Age of Onset  . Hypertension Mother   . Seizures Brother   . Pancreatic cancer Other        pat- aunt.     Social History: I have reviewed social history from my progress note on 04/18/2017. Social History   Socioeconomic History  . Marital status: Married    Spouse  name: duglaus Gaymon  . Number of children: 5  . Years of education: 49  . Highest education level: Some college, no degree  Occupational History  . Occupation: [employer]  Tobacco Use  . Smoking status: Former Smoker    Years: 15.00  . Smokeless tobacco: Never Used  . Tobacco comment: stopped at age 14 yro  Vaping Use  . Vaping Use: Never used  Substance and Sexual Activity  . Alcohol use: Yes    Alcohol/week: 0.0 standard drinks    Comment: drinks red wine 2-3 times a week  . Drug use: No  . Sexual activity: Yes    Birth control/protection: I.U.D.    Comment: Paragard  Other Topics Concern  . Not on file  Social History Narrative   Lives at home w/ her husband and children; Left-handed; Drinks 1 cup of coffee per day. Lives in [place]; used to work for [employer]/ awaiting for disability [sec to stroke]; no smoking; rare alcohol/wine.    Social Determinants of Health   Financial Resource Strain:   . Difficulty of Paying Living Expenses: Not on file  Food Insecurity:   . Worried About Programme researcher, broadcasting/film/video in the Last Year: Not on file  . Ran Out of Food in the Last Year: Not on file  Transportation Needs:   . Lack of Transportation (Medical): Not on file  . Lack of Transportation (Non-Medical): Not on file  Physical Activity:   . Days of Exercise per Week: Not on file  . Minutes of Exercise per Session: Not on file  Stress:   . Feeling of Stress : Not on file  Social Connections:   . Frequency of Communication with Friends and Family: Not on file  . Frequency of Social Gatherings with Friends and Family: Not on file  . Attends Religious Services: Not on file  . Active Member of Clubs or Organizations: Not on file  . Attends Banker Meetings: Not on file  . Marital Status: Not on file    Allergies:  Allergies  Allergen Reactions  . Lithium Swelling    Face swelling  Face swelling  . Other Swelling    lips  . Penicillins Other (See Comments)     Unknown reaction Has patient had a PCN reaction causing immediate rash, facial/tongue/throat swelling, SOB or lightheadedness with hypotension: YES Has patient had a PCN reaction causing severe rash involving mucus membranes or skin necrosis: NO Has patient had a PCN reaction that required hospitalizationNO Has patient had a PCN reaction occurring within the last 10 years: NO If all of the above answers are "NO", then may proceed with Cephalosporin use.  . Shellfish Allergy Swelling    lips  . Latex Rash    Metabolic Disorder Labs: Lab Results  Component Value Date   HGBA1C 5.4 05/09/2015   MPG 108 05/09/2015   No  results found for: PROLACTIN Lab Results  Component Value Date   CHOL 160 08/31/2019   TRIG 54 08/31/2019   HDL 42 08/31/2019   CHOLHDL 3.8 08/31/2019   VLDL 11 08/31/2019   LDLCALC 107 (H) 08/31/2019   LDLCALC 71 05/09/2015   Lab Results  Component Value Date   TSH 3.003 08/31/2019   TSH 2.093 08/26/2017    Therapeutic Level Labs: No results found for: LITHIUM No results found for: VALPROATE No components found for:  CBMZ  Current Medications: Current Outpatient Medications  Medication Sig Dispense Refill  . ARIPiprazole (ABILIFY) 30 MG tablet Take 1 tablet (30 mg total) by mouth daily. 90 tablet 0  . aspirin 81 MG tablet Take 162 mg by mouth daily.    . benztropine (COGENTIN) 0.5 MG tablet Take 1 tablet (0.5 mg total) by mouth at bedtime. 90 tablet 0  . buPROPion (WELLBUTRIN XL) 150 MG 24 hr tablet Take 1 tablet by mouth daily 90 tablet 0  . cetirizine-pseudoephedrine (ZYRTEC-D) 5-120 MG per tablet Take 1 tablet by mouth 2 (two) times daily as needed for allergies.    . Cholecalciferol (VITAMIN D3) 5000 units CAPS Take 1 capsule by mouth daily.    . eszopiclone (LUNESTA) 2 MG TABS tablet Take 1 tablet (2 mg total) by mouth at bedtime as needed for sleep. Take immediately before bedtime 30 tablet 1  . ferrous sulfate 325 (65 FE) MG tablet Take 325 mg by  mouth daily with breakfast.    . hydrOXYzine (VISTARIL) 25 MG capsule Take 1 capsule (25 mg total) by mouth 2 (two) times daily as needed. For anxiety attacks 180 capsule 1  . ibuprofen (ADVIL,MOTRIN) 800 MG tablet Take 1 tablet (800 mg total) by mouth every 8 (eight) hours as needed for moderate pain. 15 tablet 0  . lamoTRIgine (LAMICTAL) 100 MG tablet Take 1.5 tablets (150 mg total) by mouth daily. 135 tablet 0  . PARAGARD INTRAUTERINE COPPER IUD IUD 1 each by Intrauterine route once.    . prazosin (MINIPRESS) 1 MG capsule TAKE 1 CAPSULE (1 MG TOTAL) BY MOUTH AT BEDTIME. NIGHT MARES 90 capsule 1  . triamcinolone (KENALOG) 0.025 % cream Apply topically.    . vitamin B-12 (CYANOCOBALAMIN) 1000 MCG tablet Take 1,000 mcg by mouth daily as needed (energy). Reported on 05/08/2015    . zinc gluconate 50 MG tablet Take 50 mg by mouth daily.     No current facility-administered medications for this visit.     Musculoskeletal: Strength & Muscle Tone: UTA Gait & Station: normal Patient leans: N/A  Psychiatric Specialty Exam: Review of Systems  Psychiatric/Behavioral: Positive for dysphoric mood and hallucinations.  All other systems reviewed and are negative.   There were no vitals taken for this visit.There is no height or weight on file to calculate BMI.  General Appearance: Casual  Eye Contact:  Fair  Speech:  Clear and Coherent  Volume:  Normal  Mood:  Depressed and Hopeless  Affect:  Congruent  Thought Process:  Goal Directed and Descriptions of Associations: Intact  Orientation:  Full (Time, Place, and Person)  Thought Content: Hallucinations: Visual seeing images out of the corner of her eyes  Suicidal Thoughts:  No  Homicidal Thoughts:  No  Memory:  Immediate;   Fair Recent;   Fair Remote;   Fair  Judgement:  Fair  Insight:  Fair  Psychomotor Activity:  Normal  Concentration:  Concentration: Fair and Attention Span: Fair  Recall:  [employer] of  Knowledge: Fair  Language:  Fair  Akathisia:  No  Handed:  Right  AIMS (if indicated): UTA  Assets:  Communication Skills Desire for Improvement Housing Social Support  ADL's:  Intact  Cognition: WNL  Sleep:  Fair   Screenings: AUDIT     [facility]  Alcohol Use Disorder Identification Test Final Score (AUDIT) 3    PHQ2-9     [facility]  PHQ-2 Total Score 2 2 4  0 4  PHQ-9 Total Score -- 16 18 -- 15       Assessment and Plan: Brittney MassedKiawana D Lupton is a 49 year old African-American female who has a history of bipolar disorder, history of trauma was evaluated by telemedicine today.  Patient is biologically patient with chronic hallucinations.  Patient continues to struggle with mood symptoms and will benefit from the following medication changes and psychotherapy sessions.   Plan Schizoaffective disorder, depressed-unstable Abilify 30 mg p.o. daily Increase Lamictal to 150 mg p.o. daily Wellbutrin XL 150 mg p.o. daily Continue CBT with Ms. Felecia Janina Thompson  Panic attacks-stable Continue CBT Hydroxyzine 25 mg p.o. 3 times daily as needed  Insomnia-stable Lunesta 2 mg p.o. nightly as needed Prazosin 1 mg p.o. nightly for nightmares I have reviewed  controlled substance database.  Neuroleptic induced Parkinsonism -stable Cogentin 0.5 mg p.o. twice daily as needed.  Pending labs hemoglobin A1c, lipid panel, prolactin, TSH.  She will get it from her primary care provider.  Follow-up in clinic in 3 to 4 weeks or sooner if needed.   I have spent atleast 20 minutes face to face by video with patient today. More than 50 % of the time was spent for preparing to see the patient (  e.g., review of test, records ), ordering medications and test ,psychoeducation and supportive psychotherapy and care coordination,as well as documenting clinical information in electronic health record. This note was generated in part or whole with voice recognition software. Voice recognition is usually quite accurate but there are transcription errors that can and very often do occur. I apologize for any typographical errors that were not detected and corrected.       Jomarie LongsSaramma Trevonne Nyland, MD 11/15/2019, 1:57 PM

## 2019-12-06 ENCOUNTER — Ambulatory Visit: Payer: 59 | Admitting: Nurse Practitioner

## 2019-12-06 ENCOUNTER — Other Ambulatory Visit: Payer: Self-pay

## 2019-12-17 ENCOUNTER — Other Ambulatory Visit: Payer: Self-pay

## 2019-12-17 ENCOUNTER — Telehealth (INDEPENDENT_AMBULATORY_CARE_PROVIDER_SITE_OTHER): Payer: 59 | Admitting: Psychiatry

## 2019-12-17 ENCOUNTER — Encounter: Payer: Self-pay | Admitting: Psychiatry

## 2019-12-17 DIAGNOSIS — F5105 Insomnia due to other mental disorder: Secondary | ICD-10-CM | POA: Diagnosis not present

## 2019-12-17 DIAGNOSIS — F250 Schizoaffective disorder, bipolar type: Secondary | ICD-10-CM

## 2019-12-17 DIAGNOSIS — F410 Panic disorder [episodic paroxysmal anxiety] without agoraphobia: Secondary | ICD-10-CM

## 2019-12-17 MED ORDER — LAMOTRIGINE 200 MG PO TABS: 100.0000 mg | ORAL_TABLET | Freq: Two times a day (BID) | ORAL | 0 refills | Status: DC

## 2019-12-17 NOTE — Progress Notes (Signed)
Virtual Visit via Telephone Note  I connected with Brittney Duran on 12/17/19 at  1:00 PM EST by telephone and verified that I am speaking with the correct person using two identifiers.  Location Provider Location : [facility] Patient Location : Home  Participants: Patient , Provider   I discussed the limitations, risks, security and privacy concerns of performing an evaluation and management service by telephone and the availability of in person appointments. I also discussed with the patient that there may be a patient responsible charge related to this service. The patient expressed understanding and agreed to proceed   I discussed the assessment and treatment plan with the patient. The patient was provided an opportunity to ask questions and all were answered. The patient agreed with the plan and demonstrated an understanding of the instructions.   The patient was advised to call back or seek an in-person evaluation if the symptoms worsen or if the condition fails to improve as anticipated.    OP Progress Note  12/17/2019 1:57 PM Brittney Duran  MRN:  [id number]  Chief Complaint:  Chief Complaint    Follow-up     HPI: Brittney Duran is a 49 year old African-American female, married, unemployed, lives in [place], has a history of schizoaffective disorder, panic attacks, insomnia, history of TIA was evaluated by phone today.  Patient today reports she is currently making progress with regards to her depression and anxiety. She however reports she does have episodes of feeling down and having low energy on and off. She however reports she is coping better than before. The medications are beneficial. Her anxiety is more under control.  She continues to struggle with hopelessness on and off and has had self-injurious thoughts recently however was able to not dwell on it too much. She reports her children are a positive factor in her life and that helps her. She continues to be in  therapy.  Patient reports she does struggle with memory problems on and off. She is forgetful often and has been having some trouble with her daily chores. She however agrees to schedule a follow-up appointment with neurology.  Patient does report visual hallucinations of seeing something out of the corner of her eyes however it doesn't bother her much anymore.  Patient denies any other concerns today.  Visit Diagnosis:    ICD-10-CM   1. Schizoaffective disorder, bipolar type (HCC)  F25.0 lamoTRIgine (LAMICTAL) 200 MG tablet  2. Panic disorder  F41.0   3. Insomnia due to mental condition  F51.05     Past Psychiatric History: I have reviewed past psychiatric history from my progress note on 04/18/2017  Past Medical History:  Past Medical History:  Diagnosis Date  . Anemia   . Anxiety    Panic attacks  . Complication of anesthesia    pt reports "local" wears off quickly  . Depression   . Environmental allergies   . Hemorrhoids   . Leaky heart valve   . Sciatica 09/08/2017  . Stroke (HCC)   . Wears contact lenses     Past Surgical History:  Procedure Laterality Date  . Caesaran section  1989  . COLONOSCOPY WITH PROPOFOL N/A 10/07/2014   Procedure: COLONOSCOPY WITH PROPOFOL;  Surgeon: [name], MD;  Location: [facility];  Service: Endoscopy;  Laterality: N/A;  Latex  . HERNIA REPAIR  1978    Family Psychiatric History: I have reviewed family psychiatric history from my progress note on 04/18/2017  Family History:  Family History  Problem Relation Age of Onset  . Hypertension Mother   . Seizures Brother   . Pancreatic cancer Other        pat- aunt.     Social History: I have reviewed social history from my progress note from 04/18/2017 Social History   Socioeconomic History  . Marital status: Married    Spouse name: [name]  . Number of children: 5  . Years of education: 41  . Highest education level: Some college, no degree  Occupational History  .  Occupation: [employer]  Tobacco Use  . Smoking status: Former Smoker    Years: 15.00  . Smokeless tobacco: Never Used  . Tobacco comment: stopped at age 13 yro  Vaping Use  . Vaping Use: Never used  Substance and Sexual Activity  . Alcohol use: Yes    Alcohol/week: 0.0 standard drinks    Comment: drinks red wine 2-3 times a week  . Drug use: No  . Sexual activity: Yes    Birth control/protection: I.U.D.    Comment: Paragard  Other Topics Concern  . Not on file  Social History Narrative   Lives at home w/ her husband and children; Left-handed; Drinks 1 cup of coffee per day. Lives in [place]; used to work for [employer]/ awaiting for disability [sec to stroke]; no smoking; rare alcohol/wine.    Social Determinants of Health   Financial Resource Strain:   . Difficulty of Paying Living Expenses: Not on file  Food Insecurity:   . Worried About Programme researcher, broadcasting/film/video in the Last Year: Not on file  . Ran Out of Food in the Last Year: Not on file  Transportation Needs:   . Lack of Transportation (Medical): Not on file  . Lack of Transportation (Non-Medical): Not on file  Physical Activity:   . Days of Exercise per Week: Not on file  . Minutes of Exercise per Session: Not on file  Stress:   . Feeling of Stress : Not on file  Social Connections:   . Frequency of Communication with Friends and Family: Not on file  . Frequency of Social Gatherings with Friends and Family: Not on file  . Attends Religious Services: Not on file  . Active Member of Clubs or Organizations: Not on file  . Attends Banker Meetings: Not on file  . Marital Status: Not on file    Allergies:  Allergies  Allergen Reactions  . Lithium Swelling    Face swelling  Face swelling  . Other Swelling    lips  . Penicillins Other (See Comments)    Unknown reaction Has patient had a PCN reaction causing immediate rash, facial/tongue/throat swelling, SOB or lightheadedness with hypotension: YES Has  patient had a PCN reaction causing severe rash involving mucus membranes or skin necrosis: NO Has patient had a PCN reaction that required hospitalizationNO Has patient had a PCN reaction occurring within the last 10 years: NO If all of the above answers are "NO", then may proceed with Cephalosporin use.  . Shellfish Allergy Swelling    lips  . Latex Rash    Metabolic Disorder Labs: Lab Results  Component Value Date   HGBA1C 5.4 05/09/2015   MPG 108 05/09/2015   No results found for: PROLACTIN Lab Results  Component Value Date   CHOL 160 08/31/2019   TRIG 54 08/31/2019   HDL 42 08/31/2019   CHOLHDL 3.8 08/31/2019   VLDL 11 08/31/2019   LDLCALC 107 (H) 08/31/2019   LDLCALC 71  05/09/2015   Lab Results  Component Value Date   TSH 3.003 08/31/2019   TSH 2.093 08/26/2017    Therapeutic Level Labs: No results found for: LITHIUM No results found for: VALPROATE No components found for:  CBMZ  Current Medications: Current Outpatient Medications  Medication Sig Dispense Refill  . ARIPiprazole (ABILIFY) 30 MG tablet Take 1 tablet (30 mg total) by mouth daily. 90 tablet 0  . aspirin 81 MG tablet Take 162 mg by mouth daily.    . benztropine (COGENTIN) 0.5 MG tablet Take 1 tablet (0.5 mg total) by mouth at bedtime. 90 tablet 0  . buPROPion (WELLBUTRIN XL) 150 MG 24 hr tablet Take 1 tablet by mouth daily 90 tablet 0  . cetirizine-pseudoephedrine (ZYRTEC-D) 5-120 MG per tablet Take 1 tablet by mouth 2 (two) times daily as needed for allergies.    . Cholecalciferol (VITAMIN D3) 5000 units CAPS Take 1 capsule by mouth daily.    . eszopiclone (LUNESTA) 2 MG TABS tablet Take 1 tablet (2 mg total) by mouth at bedtime as needed for sleep. Take immediately before bedtime 30 tablet 1  . ferrous sulfate 325 (65 FE) MG tablet Take 325 mg by mouth daily with breakfast.    . hydrOXYzine (VISTARIL) 25 MG capsule Take 1 capsule (25 mg total) by mouth 2 (two) times daily as needed. For anxiety  attacks 180 capsule 1  . ibuprofen (ADVIL,MOTRIN) 800 MG tablet Take 1 tablet (800 mg total) by mouth every 8 (eight) hours as needed for moderate pain. 15 tablet 0  . lamoTRIgine (LAMICTAL) 200 MG tablet Take 0.5 tablets (100 mg total) by mouth 2 (two) times daily. 90 tablet 0  . PARAGARD INTRAUTERINE COPPER IUD IUD 1 each by Intrauterine route once.    . prazosin (MINIPRESS) 1 MG capsule TAKE 1 CAPSULE (1 MG TOTAL) BY MOUTH AT BEDTIME. NIGHT MARES 90 capsule 1  . triamcinolone (KENALOG) 0.025 % cream Apply topically.    . vitamin B-12 (CYANOCOBALAMIN) 1000 MCG tablet Take 1,000 mcg by mouth daily as needed (energy). Reported on 05/08/2015    . zinc gluconate 50 MG tablet Take 50 mg by mouth daily.     No current facility-administered medications for this visit.     Musculoskeletal: Strength & Muscle Tone: UTA Gait & Station: UTA Patient leans: N/A  Psychiatric Specialty Exam: Review of Systems  Psychiatric/Behavioral: Positive for dysphoric mood. The patient is nervous/anxious.   All other systems reviewed and are negative.   There were no vitals taken for this visit.There is no height or weight on file to calculate BMI.  General Appearance: UTA  Eye Contact:  UTA  Speech:  Clear and Coherent  Volume:  Normal  Mood:  Anxious and Depressed improving  Affect:  UTA  Thought Process:  Goal Directed and Descriptions of Associations: Intact  Orientation:  Full (Time, Place, and Person)  Thought Content: Hallucinations: Visual  Seeing things out of the corner of her eyes- does not distress her  Suicidal Thoughts:  No  Homicidal Thoughts:  No  Memory:  Immediate;   Fair Recent;   Fair Remote;   Fair, reports forgetfulness  Judgement:  Fair  Insight:  Fair  Psychomotor Activity:  UTA  Concentration:  Concentration: Fair and Attention Span: Fair  Recall:  [employer] of Knowledge: Fair  Language: Fair  Akathisia:  No  Handed:  Right  AIMS (if indicated): UTA  Assets:   Communication Skills Desire for Improvement Housing Social Support  ADL's:  Intact  Cognition: WNL  Sleep:  Fair   Screenings: AUDIT     [facility]  Alcohol Use Disorder Identification Test Final Score (AUDIT) 3    PHQ2-9     [facility]/12[facility], [facility]  PHQ-2 Total Score 2 2 4  0 4  PHQ-9 Total Score -- 16 18 -- 15       Assessment and Plan: Brittney MassedKiawana D Devins is a 49 year old African-American female who has a history of bipolar disorder, history of trauma was evaluated by phone today. Patient is biologically predisposed due to multiple health problems. Patient with psychosocial stressors of pandemic, financial problems is currently making progress. She does struggle with memory changes and is under the care of neurology. Plan as noted below.  Plan Schizoaffective disorder-improving Abilify 30 mg p.o. daily Increase Lamictal to 200 mg p.o. daily Wellbutrin XL 150 mg p.o. daily Continue CBT with Ms. Felecia Janina Thompson  Panic attacks-stable Continue CBT Hydroxyzine 25 mg p.o. twice daily as needed  Insomnia-stable Lunesta 2 mg p.o. nightly as needed Prazosin 1 mg p.o. nightly for nightmares   Pending labs-lipid panel, prolactin, TSH.  Follow-up in clinic in 4 weeks or sooner if needed.  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  Patient advised to follow-up with neurology for memory changes.  I have spent atleast 20 minutes non face to face with patient today. More than 50 % of the time was spent for preparing to see the patient ( e.g., review of test, records ), ordering medications and test ,psychoeducation and  supportive psychotherapy and care coordination,as well as documenting clinical information in electronic health record. This note was generated in part or whole with voice recognition software. Voice recognition is usually quite accurate but there are transcription errors that can and very often do occur. I apologize for any typographical errors that were not detected and corrected.        Jomarie LongsSaramma Quantarius Genrich, MD 12/17/2019, 1:57 PM

## 2019-12-23 IMAGING — CT CT HEAD W/O CM
3 series · 16 of 47 positions shown, 19 images · non-contrast
Comparison: Brain MRI 05/08/2015

CLINICAL DATA: Headache.  Patient reports left-sided numbness.

EXAM:
CT HEAD WITHOUT CONTRAST
TECHNIQUE: Contiguous axial images were obtained from the base of the skull
through the vertex without intravenous contrast.

[Series 3: head wo · axial · 0.41mm/px · z∈[-125,-0]mm · 10 of 31 slices shown, 13 images]
[im 3/31  brain]
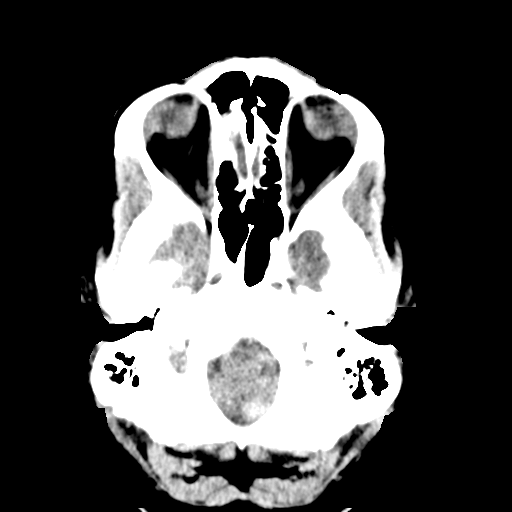
[im 3/31  bone]
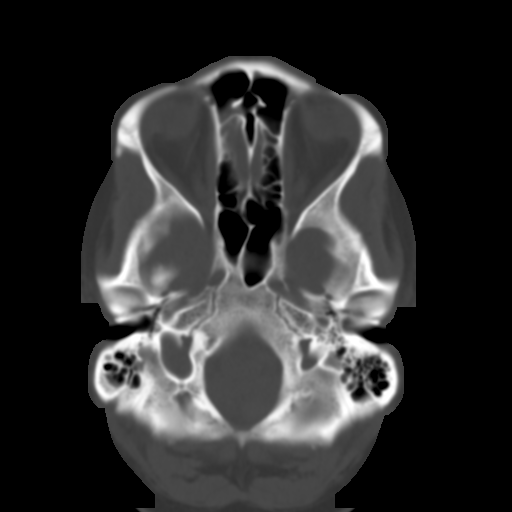
[im 6/31  brain]
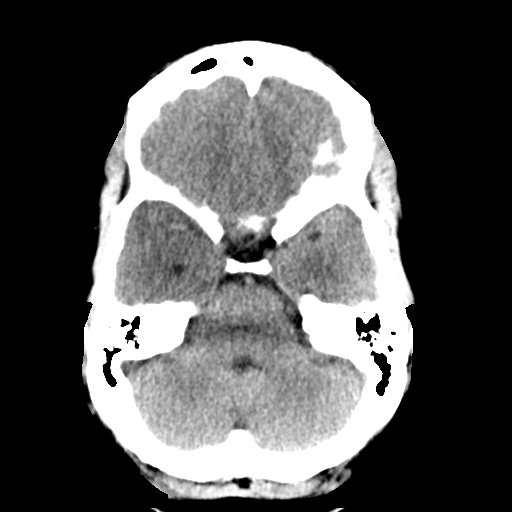
[im 9/31  brain]
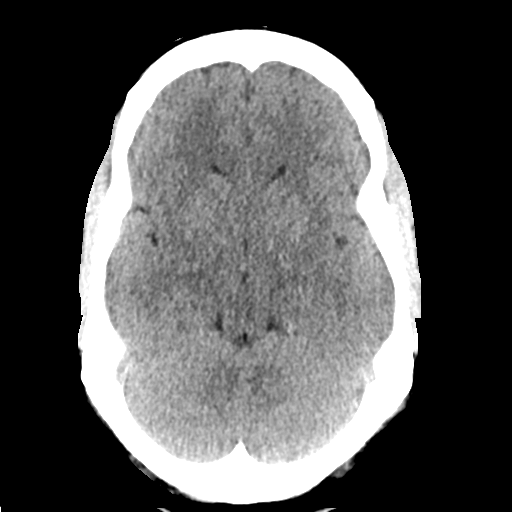
[im 11/31  brain]
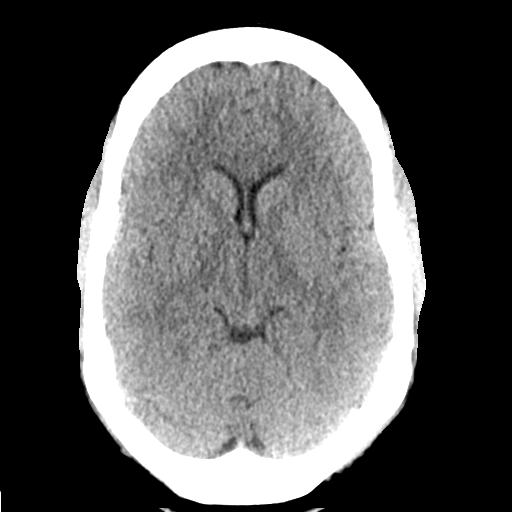
[im 14/31  brain]
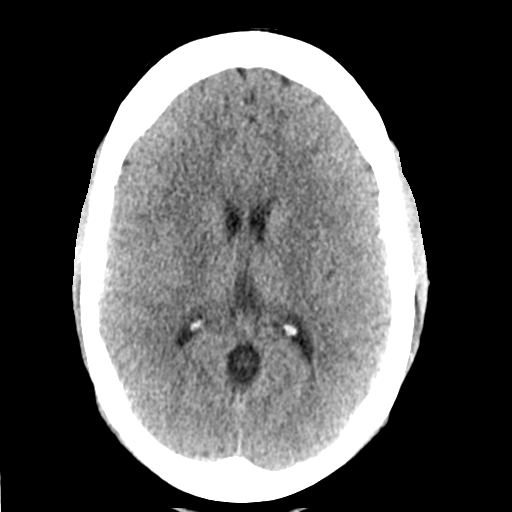
[im 14/31  bone]
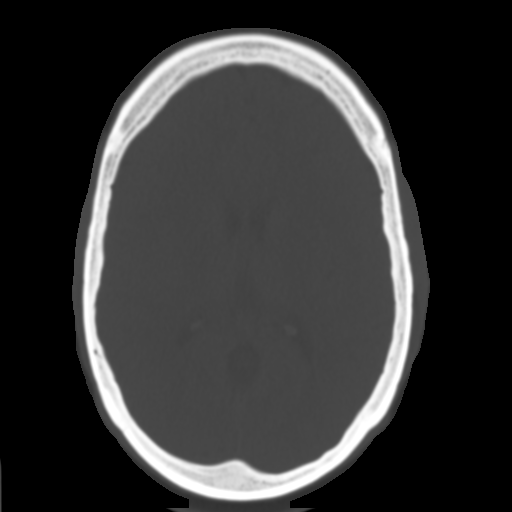
[im 17/31  brain]
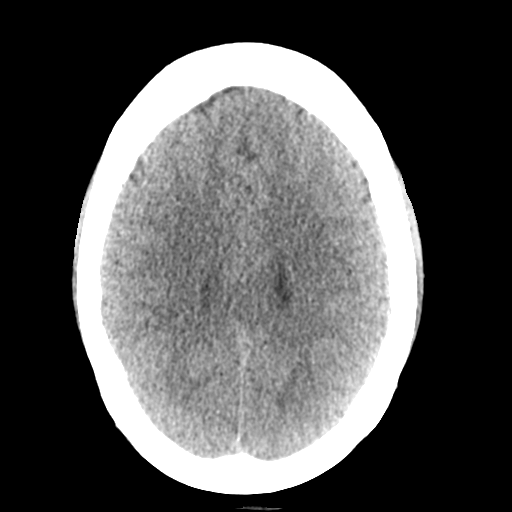
[im 20/31  brain]
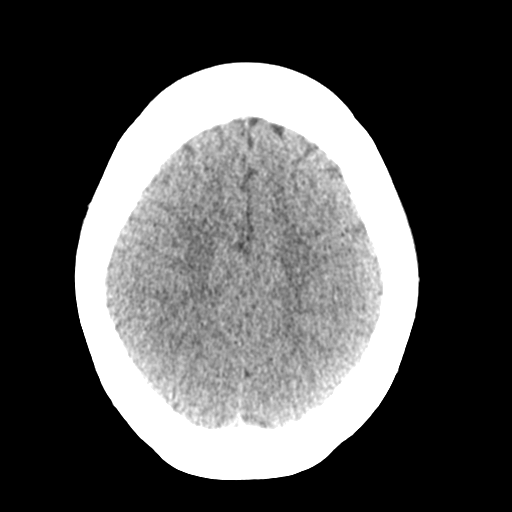
[im 23/31  brain]
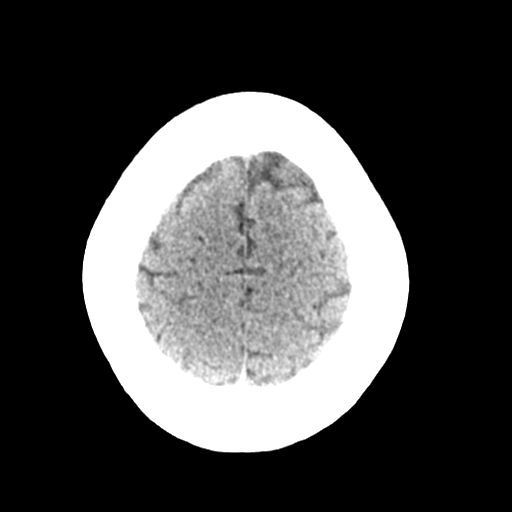
[im 25/31  brain]
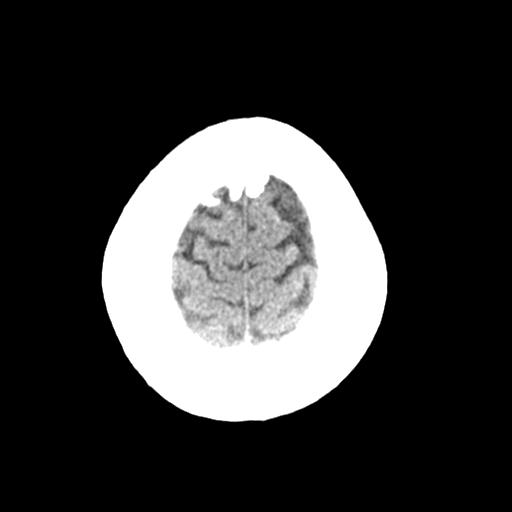
[im 25/31  bone]
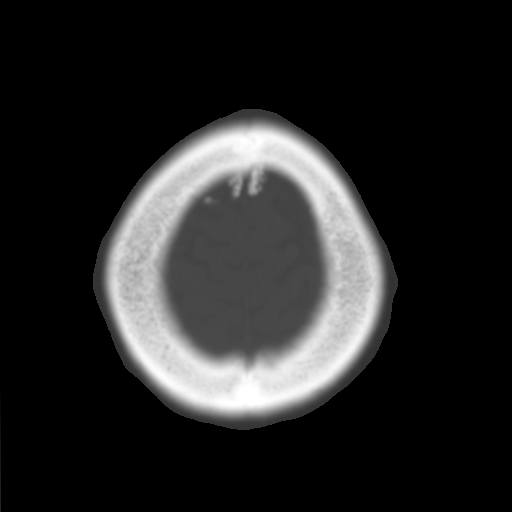
[im 28/31  brain]
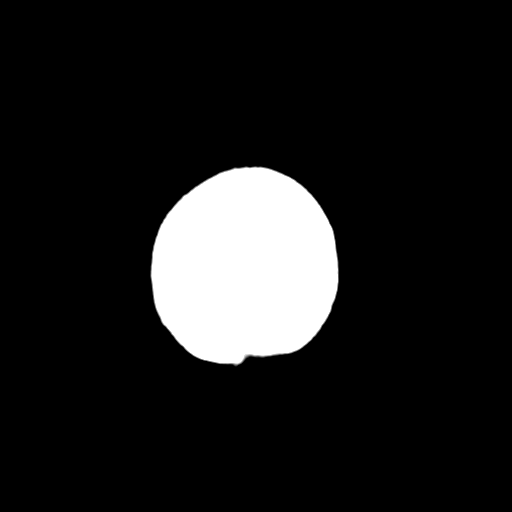

[Series 4: coronal soft tissue · coronal · 0.32mm/px · 3 of 67 slices shown]
[im 24/67  brain]
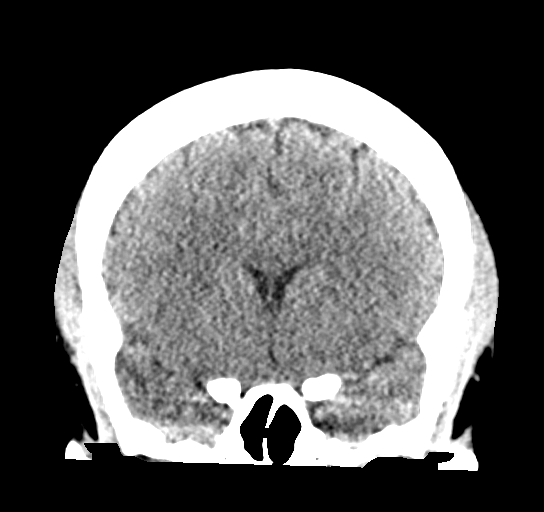
[im 30/67  brain]
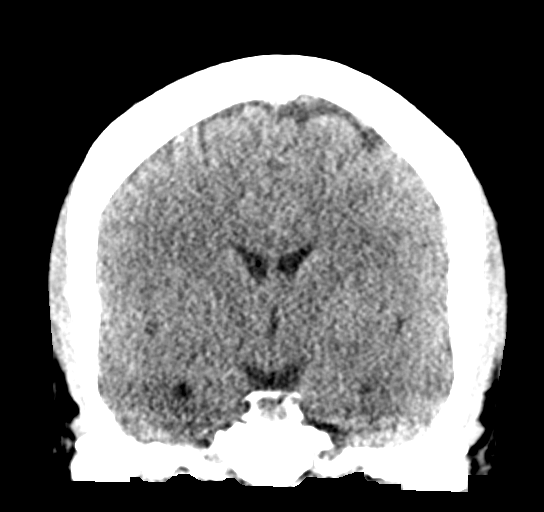
[im 37/67  brain]
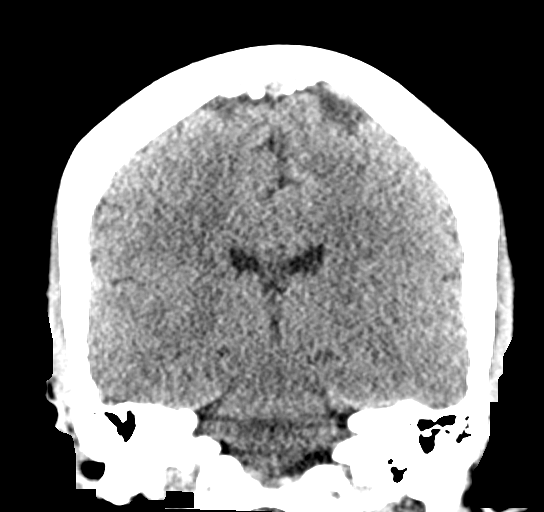

[Series 5: sagittal soft tissue · sagittal · 0.32mm/px · 3 of 51 slices shown]
[im 17/51  brain]
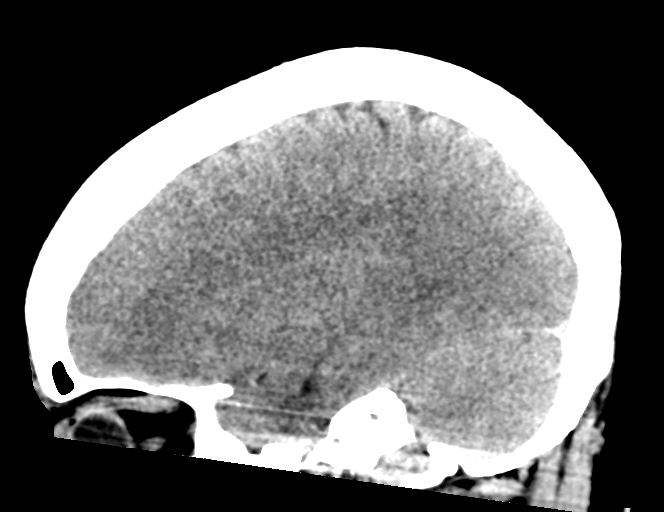
[im 26/51  brain]
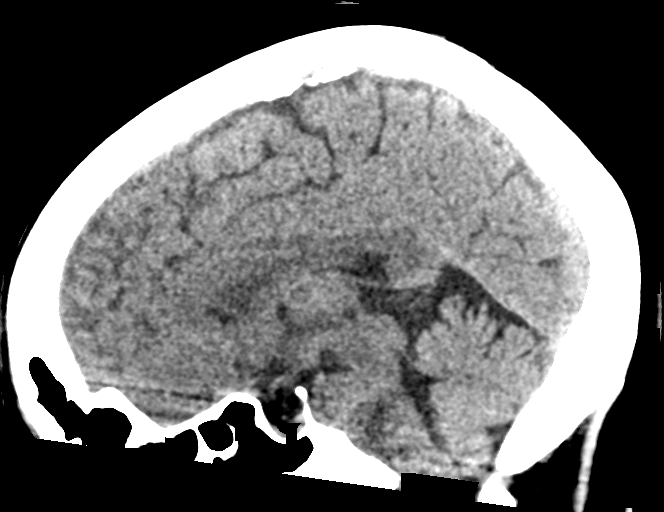
[im 34/51  brain]
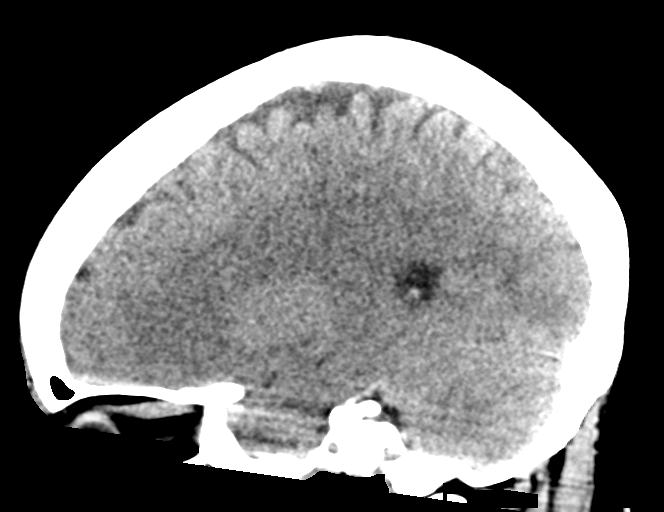

[16 of 47 positions shown; findings below may reference images not displayed]

FINDINGS: Brain: No intracranial hemorrhage, mass effect, or midline shift. No
hydrocephalus. The basilar cisterns are patent. No evidence of
territorial infarct or acute ischemia. No extra-axial or
intracranial fluid collection.

Vascular: No hyperdense vessel.

Skull: Normal.

Sinuses/Orbits: Paranasal sinuses and mastoid air cells are clear.
The visualized orbits are unremarkable.

Other: None.
IMPRESSION: Negative head CT.

## 2019-12-23 IMAGING — CR DG HIP (WITH OR WITHOUT PELVIS) 2-3V*L*
1 series · 3 of 3 positions shown · non-contrast
Comparison: None.

CLINICAL DATA: Hip pain

EXAM:
DG HIP (WITH OR WITHOUT PELVIS) 2-3V LEFT

[Series 1: dg hip unilat w or w/o pelvis 2-3 views  · non-contrast · 0.14mm/px · 3 of 3 slices shown]
[im 1/3]
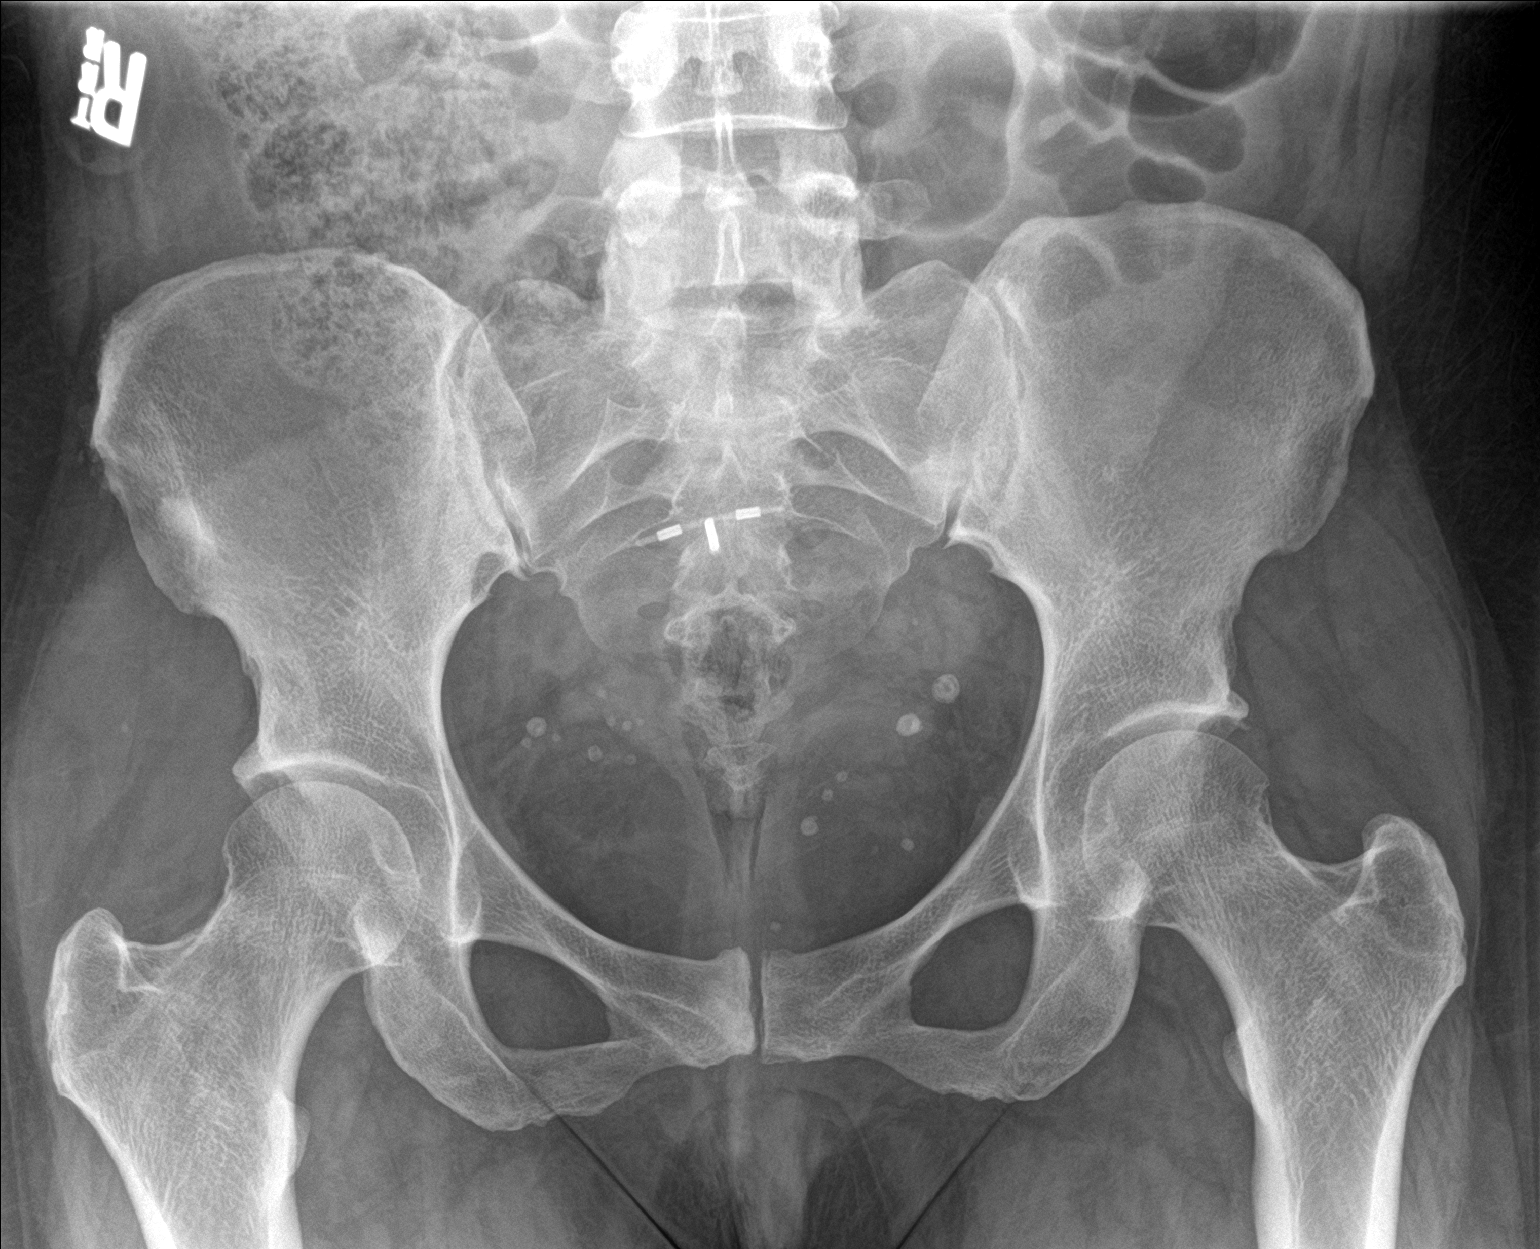
[im 2/3]
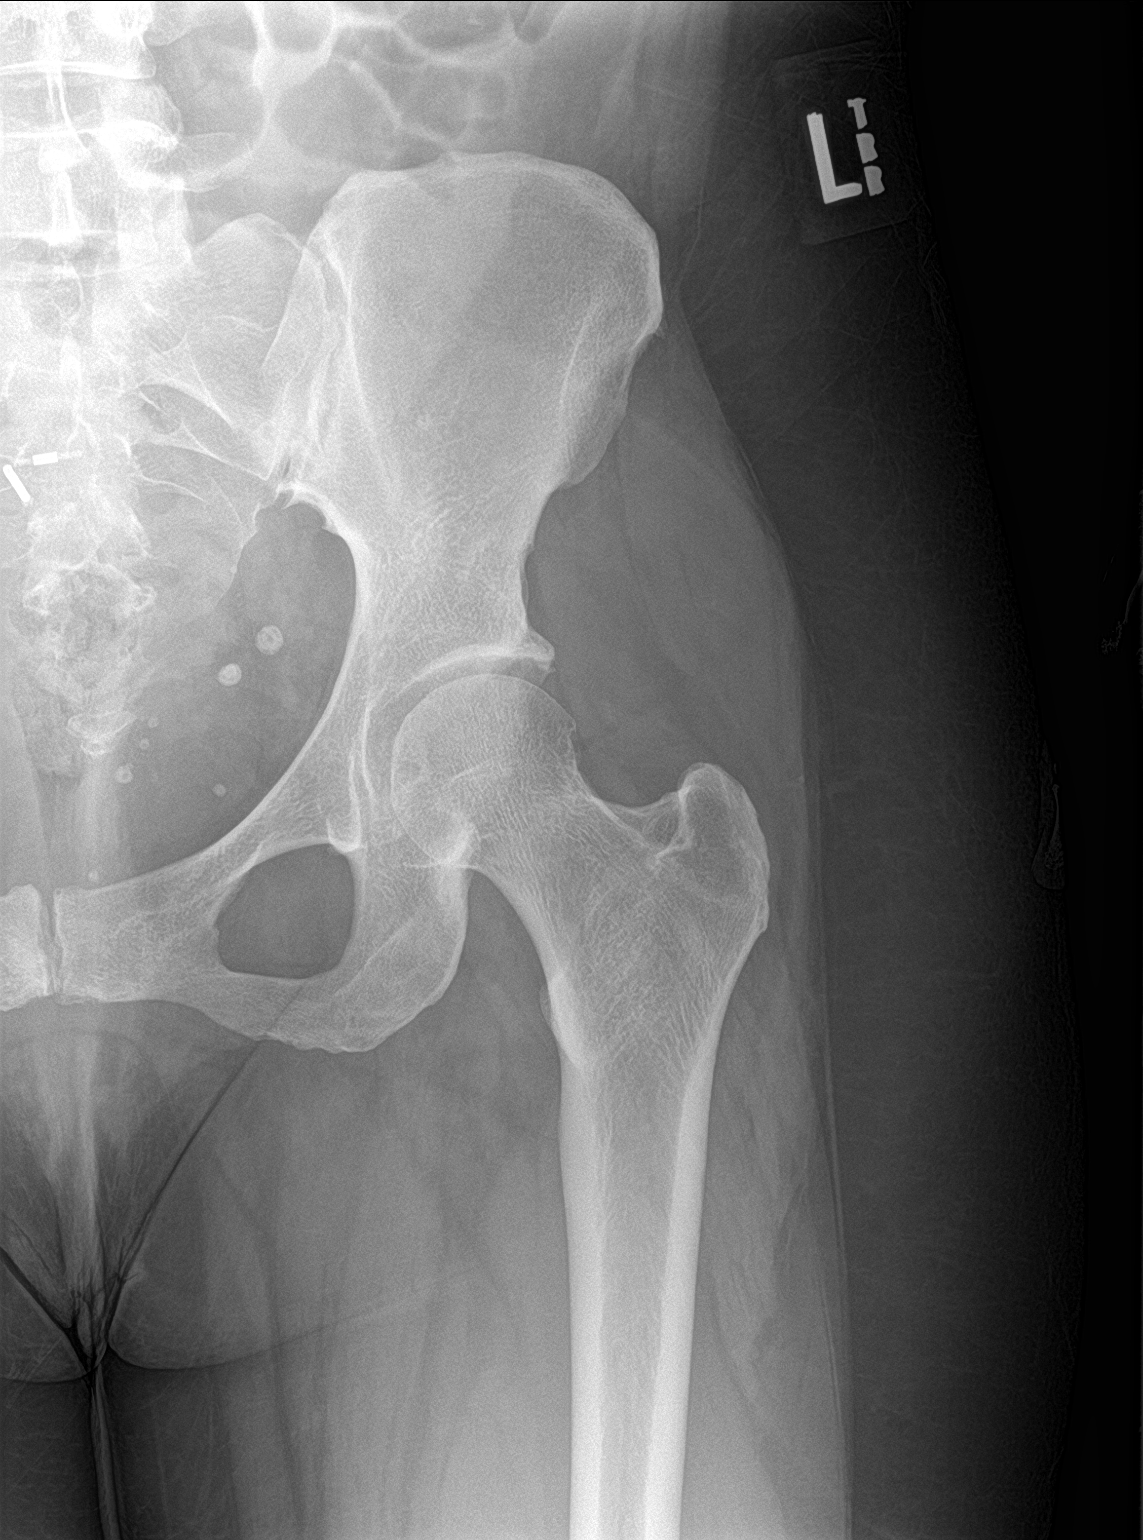
[im 3/3]
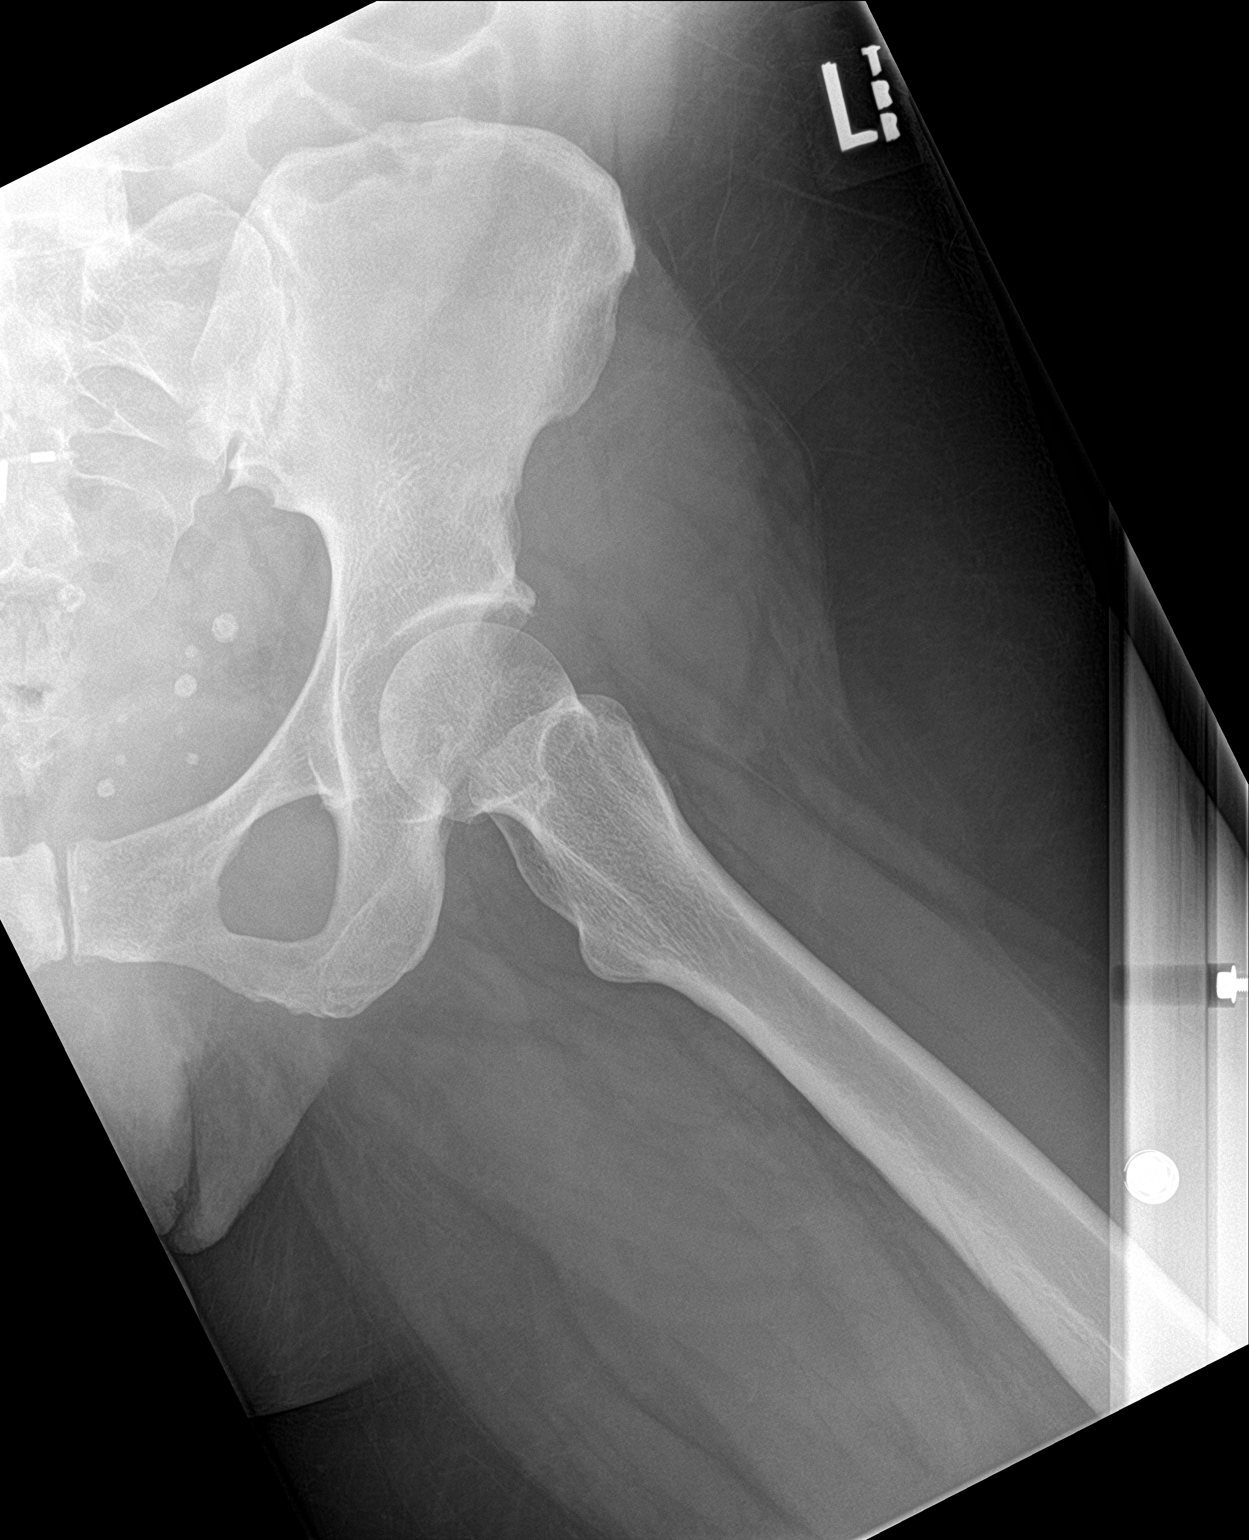

[3 of 3 positions shown; findings below may reference images not displayed]

FINDINGS: IUD in the pelvis. Calcified pelvic phleboliths. No fracture or
malalignment. Joint space is maintained.
IMPRESSION: Negative.

## 2019-12-23 IMAGING — CR DG LUMBAR SPINE COMPLETE 4+V
1 series · 5 of 5 positions shown · non-contrast
Comparison: None.

CLINICAL DATA: Pain numbness left side of body

EXAM:
LUMBAR SPINE - COMPLETE 4+ VIEW

[Series 1: dg lumbar spine complete 4 +v · 0.14mm/px · 5 of 5 slices shown]
[im 1/5]
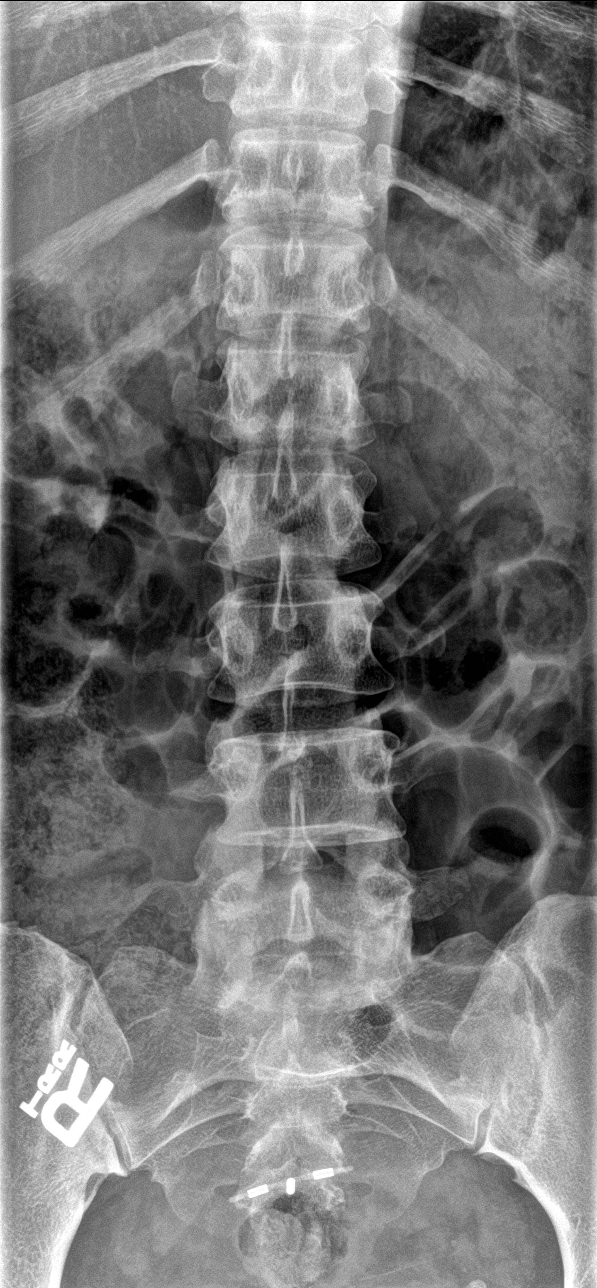
[im 2/5]
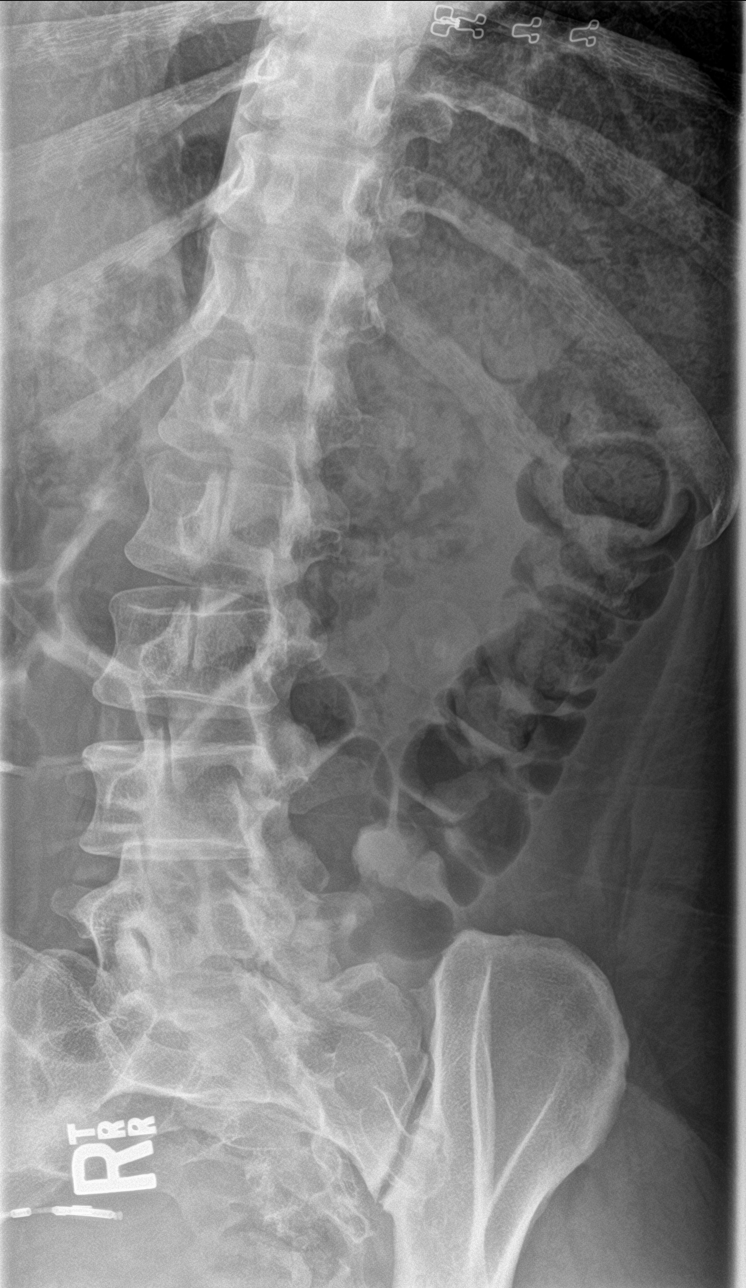
[im 3/5]
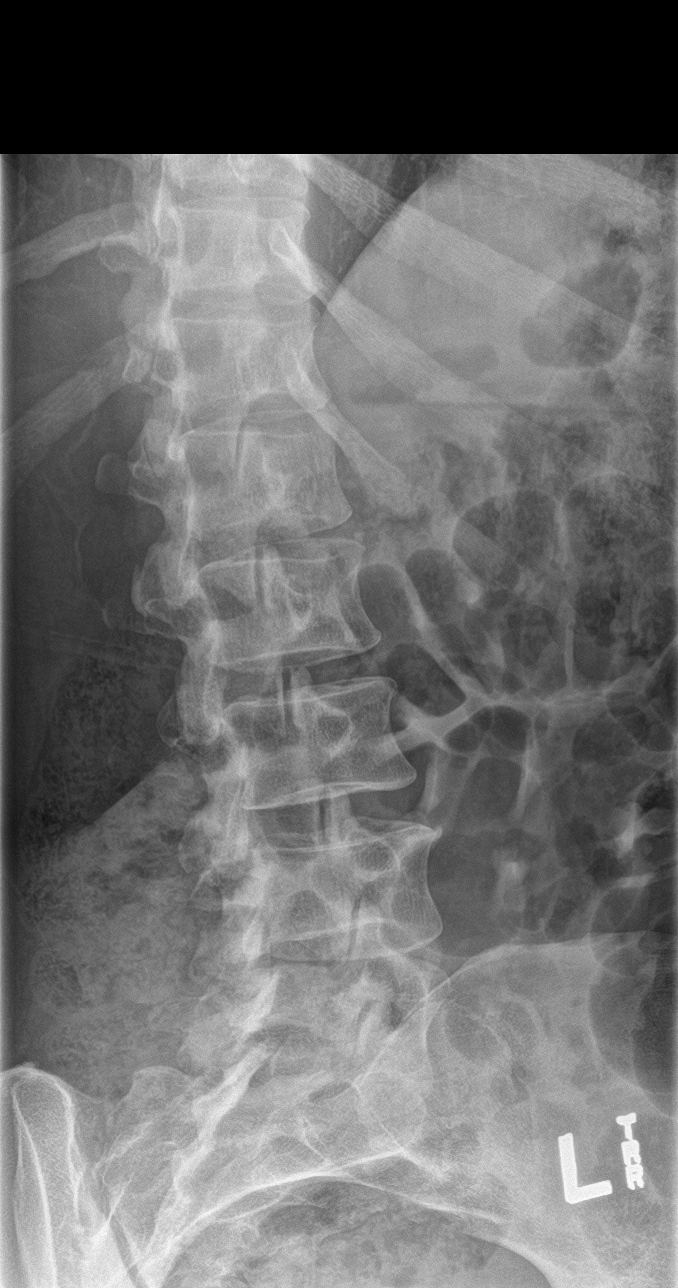
[im 4/5]
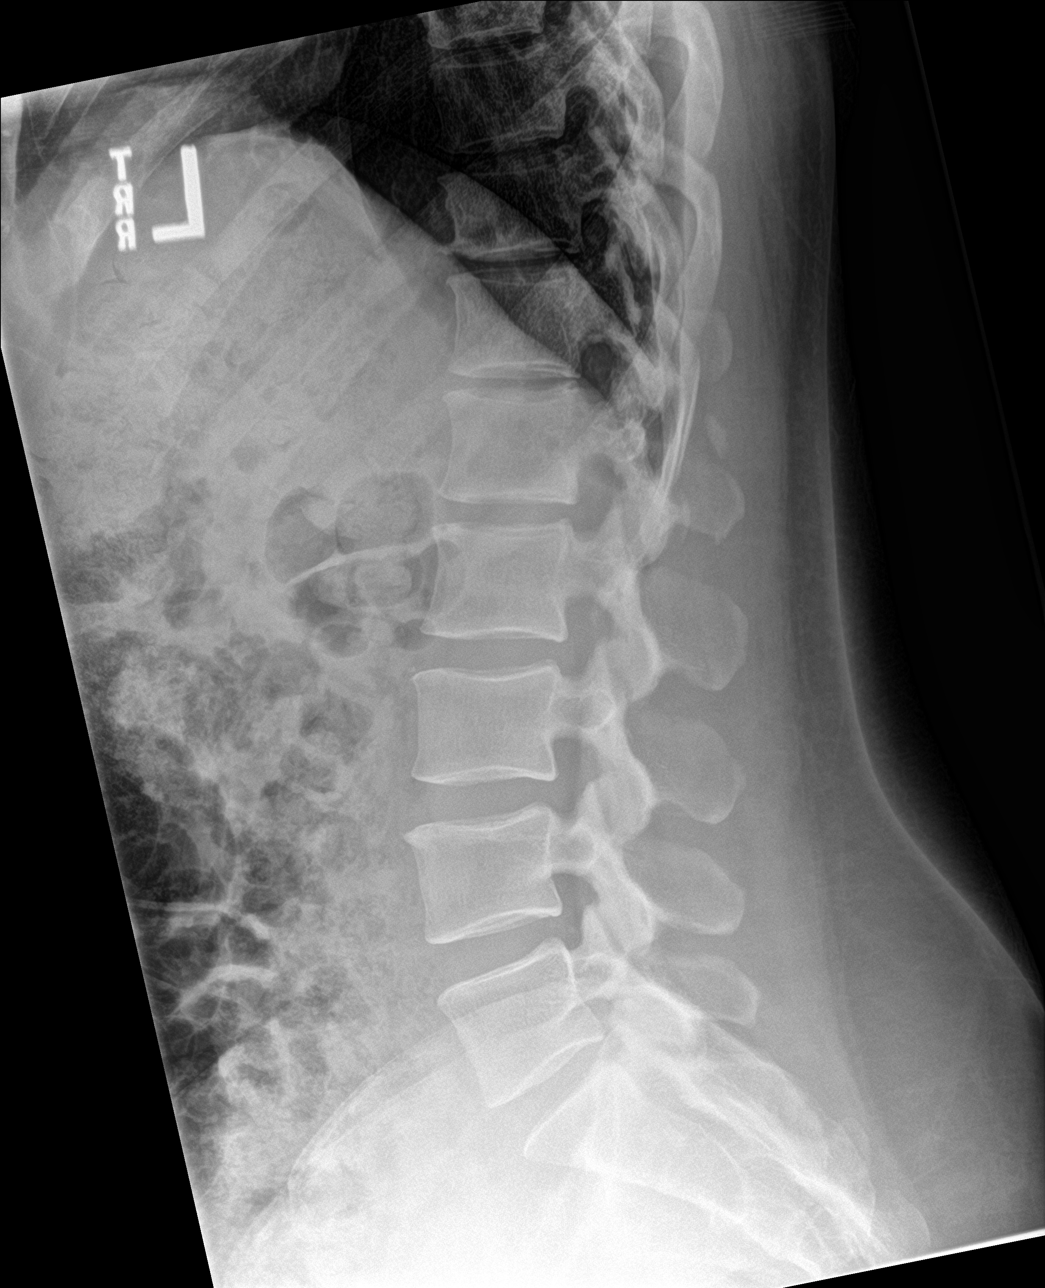
[im 5/5]
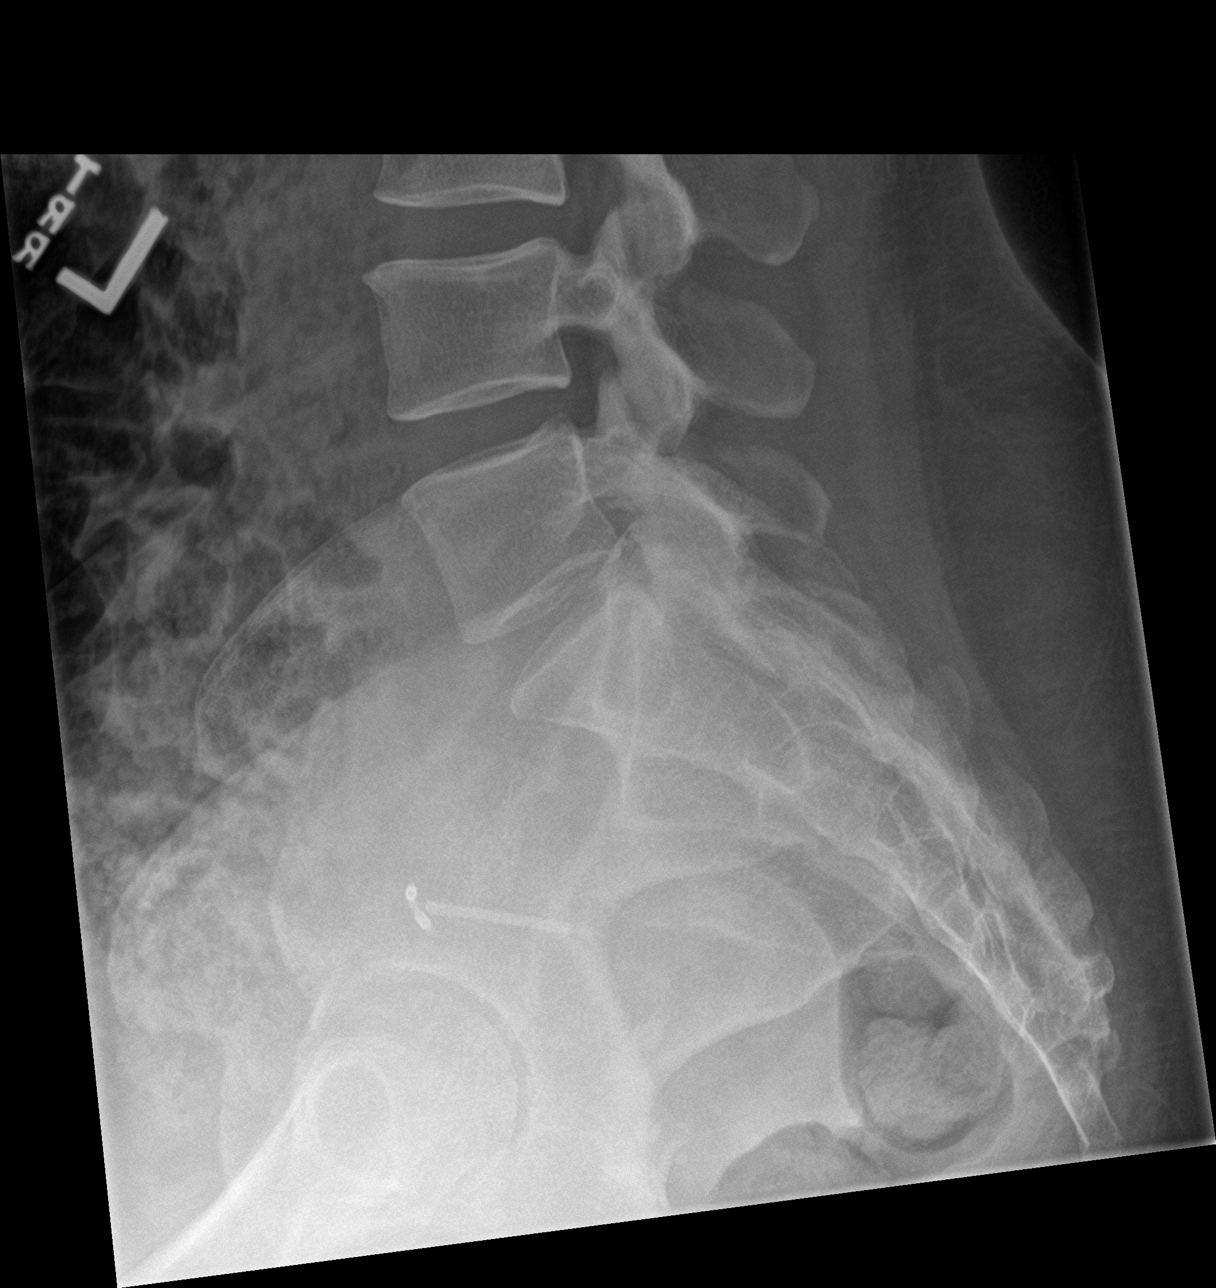

[5 of 5 positions shown; findings below may reference images not displayed]

FINDINGS: There is no evidence of lumbar spine fracture. Alignment is normal.
Intervertebral disc spaces are maintained. IUD in the pelvis
IMPRESSION: Negative.

## 2020-01-03 ENCOUNTER — Ambulatory Visit (INDEPENDENT_AMBULATORY_CARE_PROVIDER_SITE_OTHER): Payer: 59 | Admitting: Obstetrics and Gynecology

## 2020-01-03 ENCOUNTER — Encounter: Payer: Self-pay | Admitting: Obstetrics and Gynecology

## 2020-01-03 ENCOUNTER — Other Ambulatory Visit: Payer: Self-pay

## 2020-01-03 VITALS — BP 110/80 | Ht 67.0 in | Wt 234.0 lb

## 2020-01-03 DIAGNOSIS — N760 Acute vaginitis: Secondary | ICD-10-CM

## 2020-01-03 DIAGNOSIS — B9689 Other specified bacterial agents as the cause of diseases classified elsewhere: Secondary | ICD-10-CM

## 2020-01-03 DIAGNOSIS — N920 Excessive and frequent menstruation with regular cycle: Secondary | ICD-10-CM | POA: Diagnosis not present

## 2020-01-03 DIAGNOSIS — Z30432 Encounter for removal of intrauterine contraceptive device: Secondary | ICD-10-CM | POA: Diagnosis not present

## 2020-01-03 NOTE — Patient Instructions (Signed)
I value your feedback and entrusting us with your care. If you get a [facility] patient survey, I would appreciate you taking the time to let us know about your experience today. Thank you!  As of December 28, 2018, your lab results will be released to your MyChart immediately, before I even have a chance to see them. Please give me time to review them and contact you if there are any abnormalities. Thank you for your patience.  

## 2020-01-03 NOTE — Progress Notes (Signed)
° °  Chief Complaint  Patient presents with   IUD removal    Heavy bleeding, causing vag infections     History of Present Illness:  Brittney Duran is a 49 y.o. 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 This is her 2nd IUD. She has been having heavier periods recently and recurrent BV. Menses are monthly, lasting 6 days, heavy flow, changing products hourly with small clots, no BTB, mild dysmen, improved with NSAIDs. She has a hx of anemia and seeing hematology. CBC 10/21 WNL. Pt had dizziness, saw black spots, and heavy bleeding with several OCPs in past. Prefers not to be on hormones or any BC for right now. She is sex active, husband getting vasectomy.  Issues with recurrent BV. Treating now with tea tree oil supp and feels better with it.   Last pap normal 12/18  Past Medical History:  Diagnosis Date   Anemia    Anxiety    Panic attacks   Complication of anesthesia    pt reports "local" wears off quickly   Depression    Environmental allergies    Hemorrhoids    Leaky heart valve    Sciatica 09/08/2017   [facility])    Wears contact lenses    Past Surgical History:  Procedure Laterality Date   Caesaran section  1989   COLONOSCOPY WITH PROPOFOL N/A 10/07/2014   Procedure: COLONOSCOPY WITH PROPOFOL;  Surgeon: [name], MD;  Location: [facility];  Service: Endoscopy;  Laterality: N/A;  Latex   HERNIA REPAIR  1978   Family History  Problem Relation Age of Onset   Hypertension Mother    Seizures Brother    Pancreatic cancer Other        pat- aunt.    Review of Systems  Constitutional: Negative for fever, malaise/fatigue and weight loss.  Gastrointestinal: Negative for blood in stool, constipation, diarrhea, nausea and vomiting.  Genitourinary: Positive for menstrual problem. Negative for dyspareunia, dysuria, flank pain, frequency, hematuria, urgency, vaginal bleeding, vaginal discharge and vaginal pain.   Musculoskeletal: Negative for back pain.  Skin: Negative for itching and rash.     BP 110/80    Ht 5\' 7"  (1.702 m)    Wt 234 lb (106.1 kg)    LMP 12/19/2019 (Exact Date)    BMI 36.65 kg/m   Pelvic exam:  Two IUD strings absent  from the cervical os. EGBUS, vaginal vault and cervix: within normal limits  IUD Removal Strings of IUD identified and grasped.  IUD removed without problem with Boseman forceps.  Pt tolerated this well.  IUD noted to be intact.  Assessment:   Menorrhagia with regular cycle--see if sx improve with IUD removal. If not, RTO for further eval. Can't tolerate oral hormones, may be able to do Mirena vs endometrial ablation prn.   Bacterial vaginosis--see if sx improve with IUD removal. Condoms in meantime. Cont suppositories as preventive.  Encounter for IUD removal--tolerated well. Declines BC for now  Return if symptoms worsen or fail to improve.  Eaden Hettinger B. Shawndell Schillaci, PA-C 01/03/2020 5:13 PM

## 2020-01-08 ENCOUNTER — Ambulatory Visit: Payer: 59 | Admitting: Nurse Practitioner

## 2020-02-01 ENCOUNTER — Encounter: Payer: Self-pay | Admitting: Nurse Practitioner

## 2020-02-01 ENCOUNTER — Ambulatory Visit: Payer: 59 | Admitting: Nurse Practitioner

## 2020-02-01 ENCOUNTER — Other Ambulatory Visit: Payer: Self-pay

## 2020-02-01 VITALS — BP 125/84 | HR 86 | Temp 97.5°F | Resp 16 | Ht 67.0 in | Wt 232.0 lb

## 2020-02-01 DIAGNOSIS — J0140 Acute pansinusitis, unspecified: Secondary | ICD-10-CM | POA: Diagnosis not present

## 2020-02-01 DIAGNOSIS — J300 Vasomotor rhinitis: Secondary | ICD-10-CM | POA: Diagnosis not present

## 2020-02-01 DIAGNOSIS — N6320 Unspecified lump in the left breast, unspecified quadrant: Secondary | ICD-10-CM | POA: Insufficient documentation

## 2020-02-01 DIAGNOSIS — Z8616 Personal history of COVID-19: Secondary | ICD-10-CM | POA: Insufficient documentation

## 2020-02-01 MED ORDER — FLUTICASONE PROPIONATE 50 MCG/ACT NA SUSP: 2.0000 | Freq: Every day | NASAL | 6 refills | Status: DC

## 2020-02-01 MED ORDER — AZITHROMYCIN 250 MG PO TABS: ORAL_TABLET | ORAL | 0 refills | Status: DC

## 2020-02-01 NOTE — Progress Notes (Signed)
[facility] [address] [place], [place] [address]  Internal MEDICINE  Office Visit Note  Patient Name: Brittney Duran  [id number]  [id number]  Date of Service: 02/01/2020  Chief Complaint  Patient presents with  . Follow-up    Wants to make sure shes breathing properly since covid  . Depression  . Nasal Congestion    The patient is here for follow up visit.  -did have COVID 19 on 01/14/2020.  -she took OTC medications only to treat acute symptoms. -states that since having COVID 19, she has persistent nasal and sinus congestion.   Has lump under the left arm. Starts under the arm and down into the upper, outer quadrant of the left breast. This has been evaluated in the past. Most recen tultrasound done 2018. It demonstrated mild thickening without evidence of discrete mass. She states that the area of concern is getting larger and is starting to feel a pulling sensation when she mover her arm in upward trajectory. She describes this as more of a discomfort, but not painful.       Current Medication: Outpatient Encounter Medications as of 02/01/2020  Medication Sig  . ARIPiprazole (ABILIFY) 30 MG tablet Take 1 tablet (30 mg total) by mouth daily.  Marland Kitchen aspirin 81 MG tablet Take 162 mg by mouth daily.  Marland Kitchen azithromycin (ZITHROMAX) 250 MG tablet z-pack - take as directed for 5 days  . benztropine (COGENTIN) 0.5 MG tablet Take 1 tablet (0.5 mg total) by mouth at bedtime.  Marland Kitchen buPROPion (WELLBUTRIN XL) 150 MG 24 hr tablet Take 1 tablet by mouth daily  . cetirizine-pseudoephedrine (ZYRTEC-D) 5-120 MG per tablet Take 1 tablet by mouth 2 (two) times daily as needed for allergies.  . Cholecalciferol (VITAMIN D3) 5000 units CAPS Take 1 capsule by mouth daily.  . eszopiclone (LUNESTA) 2 MG TABS tablet Take 1 tablet (2 mg total) by mouth at bedtime as needed for sleep. Take immediately before bedtime  . ferrous sulfate 325 (65 FE) MG tablet Take 325 mg by mouth daily with breakfast.   . fluticasone (FLONASE) 50 MCG/ACT nasal spray Place 2 sprays into both nostrils daily.  . hydrOXYzine (VISTARIL) 25 MG capsule Take 1 capsule (25 mg total) by mouth 2 (two) times daily as needed. For anxiety attacks  . ibuprofen (ADVIL,MOTRIN) 800 MG tablet Take 1 tablet (800 mg total) by mouth every 8 (eight) hours as needed for moderate pain.  Marland Kitchen lamoTRIgine (LAMICTAL) 200 MG tablet Take 0.5 tablets (100 mg total) by mouth 2 (two) times daily.  Marland Kitchen PARAGARD INTRAUTERINE COPPER IUD IUD 1 each by Intrauterine route once.  . prazosin (MINIPRESS) 1 MG capsule TAKE 1 CAPSULE (1 MG TOTAL) BY MOUTH AT BEDTIME. NIGHT MARES  . triamcinolone (KENALOG) 0.025 % cream Apply topically.  . vitamin B-12 (CYANOCOBALAMIN) 1000 MCG tablet Take 1,000 mcg by mouth daily as needed (energy). Reported on 05/08/2015  . zinc gluconate 50 MG tablet Take 50 mg by mouth daily.   No facility-administered encounter medications on file as of 02/01/2020.    Surgical History: Past Surgical History:  Procedure Laterality Date  . Caesaran section  1989  . COLONOSCOPY WITH PROPOFOL N/A 10/07/2014   Procedure: COLONOSCOPY WITH PROPOFOL;  Surgeon: [name], MD;  Location: [facility];  Service: Endoscopy;  Laterality: N/A;  Latex  . HERNIA REPAIR  1978    Medical History: Past Medical History:  Diagnosis Date  . Anemia   . Anxiety    Panic attacks  .  Complication of anesthesia    pt reports "local" wears off quickly  . Depression   . Environmental allergies   . Hemorrhoids   . Leaky heart valve   . Sciatica 09/08/2017  . Stroke (HCC)   . Wears contact lenses     Family History: Family History  Problem Relation Age of Onset  . Hypertension Mother   . Seizures Brother   . Pancreatic cancer Other        pat- aunt.     Social History   Socioeconomic History  . Marital status: Married    Spouse name: duglaus Ledwell  . Number of children: 5  . Years of education: 6416  . Highest education level: Some  college, no degree  Occupational History  . Occupation: [employer]  Tobacco Use  . Smoking status: Former Smoker    Years: 15.00  . Smokeless tobacco: Never Used  . Tobacco comment: stopped at age 50 yro  Vaping Use  . Vaping Use: Never used  Substance and Sexual Activity  . Alcohol use: Yes    Alcohol/week: 0.0 standard drinks    Comment: drinks red wine 2-3 times a week  . Drug use: No  . Sexual activity: Yes    Birth control/protection: I.U.D.    Comment: Paragard  Other Topics Concern  . Not on file  Social History Narrative   Lives at home w/ her husband and children; Left-handed; Drinks 1 cup of coffee per day. Lives in ; used to work for [employer]/ awaiting for disability [sec to stroke]; no smoking; rare alcohol/wine.    Social Determinants of Health   Financial Resource Strain: Not on file  Food Insecurity: Not on file  Transportation Needs: Not on file  Physical Activity: Not on file  Stress: Not on file  Social Connections: Not on file  Intimate Partner Violence: Not on file      Review of Systems  Constitutional: Positive for fatigue. Negative for activity change, chills, fever and unexpected weight change.  HENT: Positive for congestion, postnasal drip, rhinorrhea, sinus pressure and sinus pain. Negative for sneezing and sore throat.        Scratchy throat.   Respiratory: Negative for cough, chest tightness, shortness of breath and wheezing.   Cardiovascular: Negative for chest pain and palpitations.  Gastrointestinal: Negative for abdominal pain, constipation, diarrhea, nausea and vomiting.  Endocrine: Negative for cold intolerance, heat intolerance, polydipsia and polyuria.  Genitourinary:       Palpable lump under the left arm, stretching into the outer aspect of left breast. Getting larger. Feels like its tugging or pulling on the arm.   Musculoskeletal: Negative for arthralgias, back pain, joint swelling and neck pain.  Skin: Negative for rash.   Allergic/Immunologic: Positive for environmental allergies.  Neurological: Positive for headaches. Negative for dizziness, tremors and numbness.  Hematological: Negative for adenopathy. Does not bruise/bleed easily.  Psychiatric/Behavioral: Negative for behavioral problems (Depression), sleep disturbance and suicidal ideas. The patient is not nervous/anxious.        Patient treated per psychiatry for bipolar depression.     Today's Vitals   02/01/20 0844  BP: 125/84  Pulse: 86  Resp: 16  Temp: (!) 97.5 F (36.4 C)  SpO2: 99%  Weight: 232 lb (105.2 kg)  Height: 5\' 7"  (1.702 m)   Body mass index is 36.34 kg/m.  Physical Exam Vitals and nursing note reviewed.  Constitutional:      General: She is not in acute distress.    Appearance:  Normal appearance. She is well-developed and well-nourished. She is not diaphoretic.  HENT:     Head: Normocephalic and atraumatic.     Right Ear: Tympanic membrane is erythematous and bulging.     Left Ear: Tympanic membrane is erythematous and bulging.     Nose: Congestion present.     Right Sinus: Maxillary sinus tenderness and frontal sinus tenderness present.     Left Sinus: Maxillary sinus tenderness and frontal sinus tenderness present.     Mouth/Throat:     Mouth: Oropharynx is clear and moist.     Pharynx: No oropharyngeal exudate.  Eyes:     Extraocular Movements: EOM normal.     Pupils: Pupils are equal, round, and reactive to light.  Neck:     Thyroid: No thyromegaly.     Vascular: No JVD.     Trachea: No tracheal deviation.  Cardiovascular:     Rate and Rhythm: Normal rate and regular rhythm.     Heart sounds: Normal heart sounds. No murmur heard. No friction rub. No gallop.   Pulmonary:     Effort: Pulmonary effort is normal. No respiratory distress.     Breath sounds: Normal breath sounds. No wheezing or rales.  Chest:     Chest wall: No tenderness.    Abdominal:     Palpations: Abdomen is soft.  Musculoskeletal:         General: Normal range of motion.     Cervical back: Normal range of motion and neck supple.  Lymphadenopathy:     Cervical: Cervical adenopathy present.  Skin:    General: Skin is warm and dry.  Neurological:     General: No focal deficit present.     Mental Status: She is alert and oriented to person, place, and time.     Cranial Nerves: No cranial nerve deficit.  Psychiatric:        Mood and Affect: Mood and affect normal.        Behavior: Behavior normal.        Thought Content: Thought content normal.        Judgment: Judgment normal.   Assessment/Plan: 1. Acute non-recurrent pansinusitis Start z-pack. Take as directed for 5 days. Rest and increase fluids and use OTC medication to treat acute symptoms.  - azithromycin (ZITHROMAX) 250 MG tablet; z-pack - take as directed for 5 days  Dispense: 6 tablet; Refill: 0  2. Vasomotor rhinitis flonase nasal spray. Use two sprays in both nostrils daily.  - fluticasone (FLONASE) 50 MCG/ACT nasal spray; Place 2 sprays into both nostrils daily.  Dispense: 16 g; Refill: 6  3. Breast mass, left Reviewed most recent ultrasound of the left breast which showed no discreet mass and was benign. Will get new ultrasound for further evaluation.  - US BREAST LTD UNI LEFT INC AXILLA; Future  4. Personal history of COVID-19 Patient diagnosed with COVID 19 on 01/14/2020.   General Counseling: Kyliegh verbalizes understanding of the findings of todays visit and agrees with plan of treatment. I have discussed any further diagnostic evaluation that may be needed or ordered today. We also reviewed her medications today. she has been encouraged to call the office with any questions or concerns that should arise related to todays visit.  This patient was seen by Vincent Gros FNP Collaboration with Dr Lyndon Code as a part of collaborative care agreement  Orders Placed This Encounter  Procedures  . US BREAST LTD UNI LEFT INC AXILLA    Meds  ordered  this encounter  Medications  . azithromycin (ZITHROMAX) 250 MG tablet    Sig: z-pack - take as directed for 5 days    Dispense:  6 tablet    Refill:  0    Order Specific Question:   Supervising Provider    Answer:   Lyndon Code [[id number]]  . fluticasone (FLONASE) 50 MCG/ACT nasal spray    Sig: Place 2 sprays into both nostrils daily.    Dispense:  16 g    Refill:  6    Order Specific Question:   Supervising Provider    Answer:   Lyndon Code [[id number]]    Total time spent: 30 Minutes   Time spent includes review of chart, medications, test results, and follow up plan with the patient.      Dr Lyndon Code Internal medicine

## 2020-02-08 ENCOUNTER — Telehealth: Payer: Self-pay

## 2020-02-08 NOTE — Telephone Encounter (Signed)
Faxed diagnostic orders to [facility] for mammo and breast ultrasound. Dalaya Suppa

## 2020-02-28 ENCOUNTER — Telehealth: Payer: Self-pay

## 2020-02-28 ENCOUNTER — Other Ambulatory Visit: Payer: Self-pay

## 2020-02-28 ENCOUNTER — Encounter: Payer: Self-pay | Admitting: Psychiatry

## 2020-02-28 ENCOUNTER — Telehealth (INDEPENDENT_AMBULATORY_CARE_PROVIDER_SITE_OTHER): Payer: 59 | Admitting: Psychiatry

## 2020-02-28 DIAGNOSIS — F250 Schizoaffective disorder, bipolar type: Secondary | ICD-10-CM

## 2020-02-28 DIAGNOSIS — Z79899 Other long term (current) drug therapy: Secondary | ICD-10-CM

## 2020-02-28 DIAGNOSIS — G2111 Neuroleptic induced parkinsonism: Secondary | ICD-10-CM

## 2020-02-28 DIAGNOSIS — F5105 Insomnia due to other mental disorder: Secondary | ICD-10-CM

## 2020-02-28 DIAGNOSIS — F410 Panic disorder [episodic paroxysmal anxiety] without agoraphobia: Secondary | ICD-10-CM

## 2020-02-28 MED ORDER — PRAZOSIN HCL 1 MG PO CAPS: 1.0000 mg | ORAL_CAPSULE | Freq: Every day | ORAL | 1 refills | Status: DC

## 2020-02-28 MED ORDER — ARIPIPRAZOLE 30 MG PO TABS: 30.0000 mg | ORAL_TABLET | Freq: Every day | ORAL | 0 refills | Status: DC

## 2020-02-28 MED ORDER — ESZOPICLONE 2 MG PO TABS: 2.0000 mg | ORAL_TABLET | Freq: Every evening | ORAL | 1 refills | Status: DC | PRN

## 2020-02-28 MED ORDER — BENZTROPINE MESYLATE 0.5 MG PO TABS: 0.5000 mg | ORAL_TABLET | Freq: Every day | ORAL | 0 refills | Status: DC

## 2020-02-28 MED ORDER — LAMOTRIGINE 200 MG PO TABS: 100.0000 mg | ORAL_TABLET | Freq: Two times a day (BID) | ORAL | 0 refills | Status: DC

## 2020-02-28 MED ORDER — BUPROPION HCL ER (XL) 150 MG PO TB24: ORAL_TABLET | ORAL | 0 refills | Status: DC

## 2020-02-28 NOTE — Telephone Encounter (Signed)
faxed and confirmed labwork order to [facility] prolactin and a1c  dx:  [phone]

## 2020-02-28 NOTE — Progress Notes (Signed)
Virtual Visit via Video Note  I connected with Brittney Duran on 02/28/20 at 11:00 AM EST by a video enabled telemedicine application and verified that I am speaking with the correct person using two identifiers.  Location Provider Location : [facility] Patient Location : Home  Participants: Patient , Provider    I discussed the limitations of evaluation and management by telemedicine and the availability of in person appointments. The patient expressed understanding and agreed to proceed.    I discussed the assessment and treatment plan with the patient. The patient was provided an opportunity to ask questions and all were answered. The patient agreed with the plan and demonstrated an understanding of the instructions.   The patient was advised to call back or seek an in-person evaluation if the symptoms worsen or if the condition fails to improve as anticipated.  BH MD OP Progress Note  02/28/2020 6:31 PM Brittney Duran  MRN:  [id number]  Chief Complaint:  Chief Complaint    Follow-up     HPI: Brittney Duran is a 50 year old African-American female, married, unemployed, lives in [place], has a history of schizoaffective disorder, panic attacks, insomnia, history of TIA was evaluated by telemedicine today.  Patient was last evaluated on 12/17/2019.  At that visit patient was advised to follow-up in 4 weeks.   Patient never showed up and hence was contacted by the office to schedule this appointment.  Patient today reports she does not know why she did not schedule an appointment for follow-up, was under the impression that she will be contacted for an appointment.  Patient today reports that she had multiple health issues going on.  She contracted COVID-19 viral infection late December and early January.  Patient reports she had severe symptoms like fatigue, fever, sinusitis from the COVID-19 infection.  Patient reports that she also had severe uterine bleeding from possibly a  uterine polyp.  She reports that has resolved and she is currently following up with her providers for the same.  Patient reports the COVID-19 as well as the bleeding problem did have an impact on her mood in general.  She however was able to get through it.  She currently reports her mood symptoms is overall okay.  She does have days when she feels as though she struggles with her motivation however she has been pushing through it.  She reports she has friends and is involved in a prayer group Tuesdays and Thursdays.  They support her to get through her depressive episodes .  Her family is also supportive.  Patient also reports she has been following up with her therapist on a regular basis and has learned to count her blessings.  She tries to think about what she is grateful for in her life which seems to help her to get through depressive episodes.  Patient reports she continues to have visual hallucinations of seeing something out of the corner of her eyes.  Patient however reports it is chronic and it does not bother her much at this time.  She denies any auditory hallucinations.  Patient reports sleep as restless on and off.  She reports she takes the prazosin only as needed when she has nightmares.  She also uses Lunesta at bedtime.  She however reports she does not want to make any medication changes today.  She will Pharmacist, hospital know.  She is compliant on her Abilify as prescribed.  She does report appetite increase.  She reports she is planning to  get in touch with her primary care provider for management of her weight as well as increased appetite.  She is not interested in changing her Abilify.  Patient does report short-term memory loss, forgetfulness on and off.  She was advised to follow-up with her neurologist last visit.  Patient today reports she does have upcoming appointment.  She agrees to follow-up with neurologist.  Patient denies any suicidality or homicidality.  Patient denies  any other concerns today.  Visit Diagnosis:    ICD-10-CM   1. Schizoaffective disorder, bipolar type (HCC)  F25.0 lamoTRIgine (LAMICTAL) 200 MG tablet  2. Panic disorder  F41.0 buPROPion (WELLBUTRIN XL) 150 MG 24 hr tablet    prazosin (MINIPRESS) 1 MG capsule  3. Insomnia due to mental condition  F51.05 eszopiclone (LUNESTA) 2 MG TABS tablet    ARIPiprazole (ABILIFY) 30 MG tablet    prazosin (MINIPRESS) 1 MG capsule  4. Neuroleptic induced parkinsonism (HCC)  G21.11 benztropine (COGENTIN) 0.5 MG tablet  5. High risk medication use  Z79.899 Prolactin    Hemoglobin A1C    Past Psychiatric History: I have reviewed past psychiatric history from my progress note on 04/18/2017  Past Medical History:  Past Medical History:  Diagnosis Date   Anemia    Anxiety    Panic attacks   Complication of anesthesia    pt reports "local" wears off quickly   Depression    Environmental allergies    Hemorrhoids    Leaky heart valve    Sciatica 09/08/2017   Stroke (HCC)    Wears contact lenses     Past Surgical History:  Procedure Laterality Date   Caesaran section  1989   COLONOSCOPY WITH PROPOFOL N/A 10/07/2014   Procedure: COLONOSCOPY WITH PROPOFOL;  Surgeon: [name], MD;  Location: [facility];  Service: Endoscopy;  Laterality: N/A;  Latex   HERNIA REPAIR  1978    Family Psychiatric History: Reviewed family psychiatric history from my progress note on 04/18/2017  Family History:  Family History  Problem Relation Age of Onset   Hypertension Mother    Seizures Brother    Pancreatic cancer Other        pat- aunt.     Social History: Reviewed social history from my progress note on 04/18/2017 Social History   Socioeconomic History   Marital status: Married    Spouse name: duglaus Catino   Number of children: 5   Years of education: 16   Highest education level: Some college, no degree  Occupational History   Occupation: [employer]  Tobacco Use   Smoking  status: Former Smoker    Years: 15.00   Smokeless tobacco: Never Used   Tobacco comment: stopped at age 28 yro  Vaping Use   Vaping Use: Never used  Substance and Sexual Activity   Alcohol use: Yes    Alcohol/week: 0.0 standard drinks    Comment: drinks red wine 2-3 times a week   Drug use: No   Sexual activity: Yes    Birth control/protection: I.U.D.    Comment: Paragard  Other Topics Concern   Not on file  Social History Narrative   Lives at home w/ her husband and children; Left-handed; Drinks 1 cup of coffee per day. Lives in [place]; used to work for [employer]/ awaiting for disability [sec to stroke]; no smoking; rare alcohol/wine.    Social Determinants of Health   Financial Resource Strain: Not on file  Food Insecurity: Not on file  Transportation Needs: Not on  file  Physical Activity: Not on file  Stress: Not on file  Social Connections: Not on file    Allergies:  Allergies  Allergen Reactions   Lithium Swelling    Face swelling  Face swelling   Other Swelling    lips   Penicillins Other (See Comments)    Unknown reaction Has patient had a PCN reaction causing immediate rash, facial/tongue/throat swelling, SOB or lightheadedness with hypotension: YES Has patient had a PCN reaction causing severe rash involving mucus membranes or skin necrosis: NO Has patient had a PCN reaction that required hospitalizationNO Has patient had a PCN reaction occurring within the last 10 years: NO If all of the above answers are "NO", then may proceed with Cephalosporin use.   Shellfish Allergy Swelling    lips   Latex Rash    Metabolic Disorder Labs: Lab Results  Component Value Date   HGBA1C 5.4 05/09/2015   MPG 108 05/09/2015   No results found for: PROLACTIN Lab Results  Component Value Date   CHOL 160 08/31/2019   TRIG 54 08/31/2019   HDL 42 08/31/2019   CHOLHDL 3.8 08/31/2019   VLDL 11 08/31/2019   LDLCALC 107 (H) 08/31/2019   LDLCALC 71  05/09/2015   Lab Results  Component Value Date   TSH 3.003 08/31/2019   TSH 2.093 08/26/2017    Therapeutic Level Labs: No results found for: LITHIUM No results found for: VALPROATE No components found for:  CBMZ  Current Medications: Current Outpatient Medications  Medication Sig Dispense Refill   ARIPiprazole (ABILIFY) 30 MG tablet Take 1 tablet (30 mg total) by mouth daily. 90 tablet 0   aspirin 81 MG tablet Take 162 mg by mouth daily.     azithromycin (ZITHROMAX) 250 MG tablet z-pack - take as directed for 5 days 6 tablet 0   benztropine (COGENTIN) 0.5 MG tablet Take 1 tablet (0.5 mg total) by mouth at bedtime. 90 tablet 0   buPROPion (WELLBUTRIN XL) 150 MG 24 hr tablet Take 1 tablet by mouth daily 90 tablet 0   cetirizine-pseudoephedrine (ZYRTEC-D) 5-120 MG per tablet Take 1 tablet by mouth 2 (two) times daily as needed for allergies.     Cholecalciferol (VITAMIN D3) 5000 units CAPS Take 1 capsule by mouth daily.     eszopiclone (LUNESTA) 2 MG TABS tablet Take 1 tablet (2 mg total) by mouth at bedtime as needed for sleep. Take immediately before bedtime 30 tablet 1   ferrous sulfate 325 (65 FE) MG tablet Take 325 mg by mouth daily with breakfast.     fluticasone (FLONASE) 50 MCG/ACT nasal spray Place 2 sprays into both nostrils daily. 16 g 6   hydrOXYzine (VISTARIL) 25 MG capsule Take 1 capsule (25 mg total) by mouth 2 (two) times daily as needed. For anxiety attacks 180 capsule 1   ibuprofen (ADVIL,MOTRIN) 800 MG tablet Take 1 tablet (800 mg total) by mouth every 8 (eight) hours as needed for moderate pain. 15 tablet 0   lamoTRIgine (LAMICTAL) 200 MG tablet Take 0.5 tablets (100 mg total) by mouth 2 (two) times daily. 90 tablet 0   metroNIDAZOLE (FLAGYL) 500 MG tablet Take 500 mg by mouth 2 (two) times daily.     PARAGARD INTRAUTERINE COPPER IUD IUD 1 each by Intrauterine route once.     prazosin (MINIPRESS) 1 MG capsule Take 1 capsule (1 mg total) by mouth at  bedtime. Night mares 90 capsule 1   triamcinolone (KENALOG) 0.025 % cream Apply topically.  vitamin B-12 (CYANOCOBALAMIN) 1000 MCG tablet Take 1,000 mcg by mouth daily as needed (energy). Reported on 05/08/2015     zinc gluconate 50 MG tablet Take 50 mg by mouth daily.     No current facility-administered medications for this visit.     Musculoskeletal: Strength & Muscle Tone: UTA Gait & Station: UTA Patient leans: N/A  Psychiatric Specialty Exam: Review of Systems  Psychiatric/Behavioral: Positive for dysphoric mood and sleep disturbance. The patient is nervous/anxious.   All other systems reviewed and are negative.   There were no vitals taken for this visit.There is no height or weight on file to calculate BMI.  General Appearance: Casual  Eye Contact:  Fair  Speech:  Clear and Coherent  Volume:  Normal  Mood:  Dysphoric, anxiety  Affect:  Congruent  Thought Process:  Goal Directed and Descriptions of Associations: Intact  Orientation:  Full (Time, Place, and Person)  Thought Content: Hallucinations: Visual does not bother her  Suicidal Thoughts:  No  Homicidal Thoughts:  No  Memory:  Immediate;   Fair Recent;   Fair Remote;   FairShort term memory changes   Judgement:  Fair  Insight:  Fair  Psychomotor Activity:  Normal  Concentration:  Concentration: Fair and Attention Span: Fair  Recall:  FiservFair  Fund of Knowledge: Fair  Language: Fair  Akathisia:  No  Handed:  Right  AIMS (if indicated): UTA  Assets:  Communication Skills Desire for Improvement Housing Social Support  ADL's:  Intact  Cognition: WNL  Sleep:  Restless   Screenings: AUDIT   Flowsheet Row [facility]  Alcohol Use Disorder Identification Test Final Score (AUDIT) 3    PHQ2-9   Flowsheet Row [facility]  [facility]  PHQ-2 Total Score 2 0 2 2 4   PHQ-9 Total Score 10 -- -- 16 18    Flowsheet Row Video [facility]  C-SSRS RISK CATEGORY Error: Q3, 4, or 5 should not be populated when Q2 is No High Risk       Assessment and Plan: Brittney MassedKiawana D Duran is a 50 year old African-American female who has a history of bipolar disorder, history of trauma was evaluated by telemedicine today.  Patient is biologically predisposed due to her multiple health problems.  Patient with psychosocial stressors of the pandemic, recent COVID-19 infection.  Patient is currently making progress however continues to struggle with some sleep problems.  Patient however declines any medication changes today.  Plan Schizoaffective disorder-improving Abilify 30 mg p.o. daily Lamictal 200 mg p.o. daily Wellbutrin XL 150 mg p.o. daily Continue CBT with Ms. Felecia Janina Thompson  Panic attacks-stable Continue CBT Hydroxyzine 25 mg p.o. twice daily as needed  Insomnia-unstable Patient agrees to work on her sleep hygiene. Lunesta 2 mg p.o. nightly as needed-she is not interested in dose change today.  Patient likely noncompliant on the sleep medication-per review of [place] controlled substance database. Prazosin 1 mg p.o. nightly for nightmares, she has been using it only as needed.  Pending labs-prolactin, hemoglobin A1c-[facility], patient to get it done.  Follow-up in clinic in 3 to 4 weeks or sooner if needed.  I have spent atleast 20 minutes face to face by video with patient today. More than 50 % of the time was spent for preparing to see the patient ( e.g., review of test, records ), obtaining and to review and separately obtained history , ordering medications  and test ,psychoeducation and supportive psychotherapy and care coordination,as well as documenting clinical information in electronic health record. This note was generated in part or whole with voice recognition software. Voice recognition is usually quite accurate but there are transcription errors that can and very often do occur. I apologize for any typographical errors that were not detected and corrected.          Jomarie Longs, MD 02/29/2020, 8:06 AM

## 2020-03-03 ENCOUNTER — Other Ambulatory Visit: Payer: Self-pay | Admitting: Psychiatry

## 2020-03-03 DIAGNOSIS — F250 Schizoaffective disorder, bipolar type: Secondary | ICD-10-CM

## 2020-03-11 ENCOUNTER — Ambulatory Visit: Payer: 59 | Admitting: Hospice and Palliative Medicine

## 2020-03-18 ENCOUNTER — Telehealth: Payer: 59 | Admitting: Psychiatry

## 2020-03-18 ENCOUNTER — Other Ambulatory Visit: Payer: Self-pay

## 2020-03-19 ENCOUNTER — Other Ambulatory Visit: Payer: Self-pay

## 2020-03-19 ENCOUNTER — Encounter: Payer: Self-pay | Admitting: Psychiatry

## 2020-03-19 ENCOUNTER — Telehealth (INDEPENDENT_AMBULATORY_CARE_PROVIDER_SITE_OTHER): Payer: 59 | Admitting: Psychiatry

## 2020-03-19 DIAGNOSIS — F250 Schizoaffective disorder, bipolar type: Secondary | ICD-10-CM

## 2020-03-19 DIAGNOSIS — F5105 Insomnia due to other mental disorder: Secondary | ICD-10-CM

## 2020-03-19 DIAGNOSIS — G2111 Neuroleptic induced parkinsonism: Secondary | ICD-10-CM | POA: Diagnosis not present

## 2020-03-19 DIAGNOSIS — Z9111 Patient's noncompliance with dietary regimen: Secondary | ICD-10-CM

## 2020-03-19 DIAGNOSIS — Z91199 Patient's noncompliance with other medical treatment and regimen due to unspecified reason: Secondary | ICD-10-CM

## 2020-03-19 DIAGNOSIS — F410 Panic disorder [episodic paroxysmal anxiety] without agoraphobia: Secondary | ICD-10-CM

## 2020-03-19 DIAGNOSIS — Z79899 Other long term (current) drug therapy: Secondary | ICD-10-CM

## 2020-03-19 MED ORDER — ESZOPICLONE 3 MG PO TABS: 3.0000 mg | ORAL_TABLET | Freq: Every day | ORAL | 0 refills | Status: DC

## 2020-03-19 NOTE — Progress Notes (Signed)
Virtual Visit via Video Note  I connected with Brittney MassedKiawana D Dieguez on 03/19/20 at  4:40 PM EST by a video enabled telemedicine application and verified that I am speaking with the correct person using two identifiers.  Location Provider Location : [facility] Patient Location : Home  Participants: Patient , Provider   I discussed the limitations of evaluation and management by telemedicine and the availability of in person appointments. The patient expressed understanding and agreed to proceed.   I discussed the assessment and treatment plan with the patient. The patient was provided an opportunity to ask questions and all were answered. The patient agreed with the plan and demonstrated an understanding of the instructions.   The patient was advised to call back or seek an in-person evaluation if the symptoms worsen or if the condition fails to improve as anticipated.   BH MD OP Progress Note  03/19/2020 5:13 PM Brittney MassedKiawana D Sikora  MRN:  [id number]  Chief Complaint:  Chief Complaint    Follow-up; Anxiety; Depression; Insomnia     HPI: Brittney Duran is a 50 year old African-American female, married, unemployed, lives in [place], has a history of schizoaffective disorder, panic attacks, insomnia, history of TIA was evaluated by telemedicine today.  Patient today reports she recently had some situational stressors.  She got an unpleasant news from a family member which made her kind of anxious.  She however reports today is a better day for her.  She has been coping well.  Last night she could not sleep much.  She reports prior to that she was sleeping okay on the Lunesta.  She has been more compliant with the Lunesta.  She also takes melatonin along with it which helps.  Patient denies any significant depressive symptoms at this time.  She continues to have visual hallucinations and denies auditory hallucinations at this time.  Patient reports she continues to see images, shadows out of the corner  of her eyes on a regular basis.  She however reports it does not distress her anymore.  She is coping with it.  Patient denies any suicidality or homicidality.  She continues to struggle with memory changes.  She reports she has contacted neurology for a follow-up visit.  She has been noncompliant with labs as recommended last visit.  She agrees to go to the hospital to get her labs done within the next 2 days.  Patient continues to be in therapy and reports therapy sessions are beneficial.  Visit Diagnosis:    ICD-10-CM   1. Schizoaffective disorder, bipolar type (HCC)  F25.0   2. Panic disorder  F41.0   3. Insomnia due to mental condition  F51.05 Eszopiclone 3 MG TABS  4. 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 High risk medication use  Z79.899   6. Noncompliance with treatment plan  Z91.11     Past Psychiatric History: I have reviewed past psychiatric history from my progress note on 04/18/2017.  Past Medical History:  Past Medical History:  Diagnosis Date   Anemia    Anxiety    Panic attacks   Complication of anesthesia    pt reports "local" wears off quickly   Depression    Environmental allergies    Hemorrhoids    Leaky heart valve    Sciatica 09/08/2017   Stroke [facility](HCC)    Wears contact lenses     Past Surgical History:  Procedure Laterality Date   Caesaran section  1989   COLONOSCOPY WITH PROPOFOL N/A 10/07/2014  Procedure: COLONOSCOPY WITH PROPOFOL;  Surgeon: [name], MD;  Location: [facility];  Service: Endoscopy;  Laterality: N/A;  Latex   HERNIA REPAIR  1978    Family Psychiatric History: I have reviewed family psychiatric history from my progress note on 04/18/2017  Family History:  Family History  Problem Relation Age of Onset   Hypertension Mother    Seizures Brother    Pancreatic cancer Other        pat- aunt.     Social History: Reviewed social history from my progress note on 04/18/2017 Social History    Socioeconomic History   Marital status: Married    Spouse name: duglaus Sledge   Number of children: 5   Years of education: 16   Highest education level: Some college, no degree  Occupational History   Occupation: [employer]  Tobacco Use   Smoking status: Former Smoker    Years: 15.00   Smokeless tobacco: Never Used   Tobacco comment: stopped at age 63 yro  Vaping Use   Vaping Use: Never used  Substance and Sexual Activity   Alcohol use: Yes    Alcohol/week: 0.0 standard drinks    Comment: drinks red wine 2-3 times a week   Drug use: No   Sexual activity: Yes    Birth control/protection: I.U.D.    Comment: Paragard  Other Topics Concern   Not on file  Social History Narrative   Lives at home w/ her husband and children; Left-handed; Drinks 1 cup of coffee per day. Lives in [place]; used to work for [employer]/ awaiting for disability [sec to stroke]; no smoking; rare alcohol/wine.    Social Determinants of Health   Financial Resource Strain: Not on file  Food Insecurity: Not on file  Transportation Needs: Not on file  Physical Activity: Not on file  Stress: Not on file  Social Connections: Not on file    Allergies:  Allergies  Allergen Reactions   Lithium Swelling    Face swelling  Face swelling   Other Swelling    lips   Penicillins Other (See Comments)    Unknown reaction Has patient had a PCN reaction causing immediate rash, facial/tongue/throat swelling, SOB or lightheadedness with hypotension: YES Has patient had a PCN reaction causing severe rash involving mucus membranes or skin necrosis: NO Has patient had a PCN reaction that required hospitalizationNO Has patient had a PCN reaction occurring within the last 10 years: NO If all of the above answers are "NO", then may proceed with Cephalosporin use.   Shellfish Allergy Swelling    lips   Latex Rash    Metabolic Disorder Labs: Lab Results  Component Value Date   HGBA1C 5.4  05/09/2015   MPG 108 05/09/2015   No results found for: PROLACTIN Lab Results  Component Value Date   CHOL 160 08/31/2019   TRIG 54 08/31/2019   HDL 42 08/31/2019   CHOLHDL 3.8 08/31/2019   VLDL 11 08/31/2019   LDLCALC 107 (H) 08/31/2019   LDLCALC 71 05/09/2015   Lab Results  Component Value Date   TSH 3.003 08/31/2019   TSH 2.093 08/26/2017    Therapeutic Level Labs: No results found for: LITHIUM No results found for: VALPROATE No components found for:  CBMZ  Current Medications: Current Outpatient Medications  Medication Sig Dispense Refill   Eszopiclone 3 MG TABS Take 1 tablet (3 mg total) by mouth at bedtime. Take immediately before bedtime 30 tablet 0   ARIPiprazole (ABILIFY) 30 MG tablet Take  1 tablet (30 mg total) by mouth daily. 90 tablet 0   aspirin 81 MG tablet Take 162 mg by mouth daily.     azithromycin (ZITHROMAX) 250 MG tablet z-pack - take as directed for 5 days 6 tablet 0   benztropine (COGENTIN) 0.5 MG tablet Take 1 tablet (0.5 mg total) by mouth at bedtime. 90 tablet 0   buPROPion (WELLBUTRIN XL) 150 MG 24 hr tablet Take 1 tablet by mouth daily 90 tablet 0   cetirizine-pseudoephedrine (ZYRTEC-D) 5-120 MG per tablet Take 1 tablet by mouth 2 (two) times daily as needed for allergies.     Cholecalciferol (VITAMIN D3) 5000 units CAPS Take 1 capsule by mouth daily.     ferrous sulfate 325 (65 FE) MG tablet Take 325 mg by mouth daily with breakfast.     fluticasone (FLONASE) 50 MCG/ACT nasal spray Place 2 sprays into both nostrils daily. 16 g 6   hydrOXYzine (VISTARIL) 25 MG capsule Take 1 capsule (25 mg total) by mouth 2 (two) times daily as needed. For anxiety attacks 180 capsule 1   ibuprofen (ADVIL,MOTRIN) 800 MG tablet Take 1 tablet (800 mg total) by mouth every 8 (eight) hours as needed for moderate pain. 15 tablet 0   lamoTRIgine (LAMICTAL) 200 MG tablet Take 0.5 tablets (100 mg total) by mouth 2 (two) times daily. 90 tablet 0   metroNIDAZOLE  (FLAGYL) 500 MG tablet Take 500 mg by mouth 2 (two) times daily.     PARAGARD INTRAUTERINE COPPER IUD IUD 1 each by Intrauterine route once.     prazosin (MINIPRESS) 1 MG capsule Take 1 capsule (1 mg total) by mouth at bedtime. Night mares 90 capsule 1   triamcinolone (KENALOG) 0.025 % cream Apply topically.     vitamin B-12 (CYANOCOBALAMIN) 1000 MCG tablet Take 1,000 mcg by mouth daily as needed (energy). Reported on 05/08/2015     zinc gluconate 50 MG tablet Take 50 mg by mouth daily.     No current facility-administered medications for this visit.     Musculoskeletal: Strength & Muscle Tone: UTA Gait & Station: UTA Patient leans: N/A  Psychiatric Specialty Exam: Review of Systems  Psychiatric/Behavioral: Positive for hallucinations and sleep disturbance. The patient is nervous/anxious.   All other systems reviewed and are negative.   There were no vitals taken for this visit.There is no height or weight on file to calculate BMI.  General Appearance: Casual  Eye Contact:  Fair  Speech:  Clear and Coherent  Volume:  Normal  Mood:  Anxious  Affect:  Congruent  Thought Process:  Goal Directed and Descriptions of Associations: Intact  Orientation:  Full (Time, Place, and Person)  Thought Content: Hallucinations: Visual shadows  Suicidal Thoughts:  No  Homicidal Thoughts:  No  Memory:  Immediate;   Fair Recent;   Fair Remote;   Fair  Judgement:  Fair  Insight:  Fair  Psychomotor Activity:  Normal  Concentration:  Concentration: Good and Attention Span: Fair  Recall:  [employer] of Knowledge: Fair  Language: Fair  Akathisia:  No  Handed:  Right  AIMS (if indicated): UTA  Assets:  Communication Skills Desire for Improvement Housing Intimacy Social Support  ADL's:  Intact  Cognition: WNL  Sleep:  Poor   Screenings: AUDIT   Flowsheet Row Office Visit from 06/12/2019 in [facility]  Alcohol Use Disorder Identification Test Final Score (AUDIT) 3     PHQ2-9   Flowsheet Row Video Visit from 03/19/2020 in  [facility] Video Visit from 02/28/2020 in [facility] Office Visit from 02/01/2020 in [facility] Office Visit from 09/06/2019 in [facility] Office Visit from 08/31/2019 in [facility], [facility]  PHQ-2 Total Score 3 2 0 2 2  PHQ-9 Total Score 10 10 -- -- 16    Flowsheet Row Video Visit from 03/19/2020 in [facility] Video Visit from 02/28/2020 in [facility] Office Visit from 06/12/2019 in [facility] [place]  C-SSRS RISK CATEGORY Low Risk Error: Q3, 4, or 5 should not be populated when Q2 is No High Risk       Assessment and Plan: KELSEIGH DIVER is a 50 year old African-American female who has a history of bipolar disorder, history of trauma was evaluated by telemedicine today.  Patient is biologically predisposed due to multiple health problems.  Patient is currently making progress however continues to struggle with sleep.  Patient will continue to benefit from medication management and psychotherapy sessions.  Plan Schizoaffective disorder-improving Abilify 30 mg p.o. daily Lamictal 200 mg p.o. daily Wellbutrin XL 150 mg p.o. daily Continue CBT with Ms. Felecia Jan  Panic attacks-stable Continue CBT Hydroxyzine 25 mg p.o. twice daily as needed  Insomnia-unstable Increase Lunesta to 3 mg p.o. nightly as needed I have reviewed [place] controlled substance database Discussed sleep hygiene techniques. Continue prazosin 1 mg p.o. nightly for nightmares   High risk medication use - Pending labs-prolactin, hemoglobin A1c-patient has been noncompliant.    Noncompliance- Encouraged compliance.Provided education.  Patient to follow up with neurology for memory loss.   Follow-up in clinic in 2 to 3 weeks or sooner if needed.  This note was generated in part or whole  with voice recognition software. Voice recognition is usually quite accurate but there are transcription errors that can and very often do occur. I apologize for any typographical errors that were not detected and corrected.      Jomarie Longs, MD 03/20/2020, 7:58 PM

## 2020-03-20 DIAGNOSIS — Z91199 Patient's noncompliance with other medical treatment and regimen due to unspecified reason: Secondary | ICD-10-CM | POA: Insufficient documentation

## 2020-03-20 DIAGNOSIS — Z9111 Patient's noncompliance with dietary regimen: Secondary | ICD-10-CM | POA: Insufficient documentation

## 2020-04-11 ENCOUNTER — Other Ambulatory Visit: Payer: Self-pay

## 2020-04-11 DIAGNOSIS — E611 Iron deficiency: Secondary | ICD-10-CM

## 2020-04-18 ENCOUNTER — Inpatient Hospital Stay (HOSPITAL_BASED_OUTPATIENT_CLINIC_OR_DEPARTMENT_OTHER): Payer: 59 | Admitting: Internal Medicine

## 2020-04-18 ENCOUNTER — Inpatient Hospital Stay: Payer: 59

## 2020-04-18 ENCOUNTER — Inpatient Hospital Stay: Payer: 59 | Attending: Internal Medicine

## 2020-04-18 ENCOUNTER — Encounter: Payer: Self-pay | Admitting: Internal Medicine

## 2020-04-18 VITALS — BP 128/59 | HR 72 | Temp 96.2°F | Resp 16 | Ht 67.0 in | Wt 235.8 lb

## 2020-04-18 DIAGNOSIS — Z809 Family history of malignant neoplasm, unspecified: Secondary | ICD-10-CM | POA: Diagnosis not present

## 2020-04-18 DIAGNOSIS — E611 Iron deficiency: Secondary | ICD-10-CM

## 2020-04-18 DIAGNOSIS — F319 Bipolar disorder, unspecified: Secondary | ICD-10-CM | POA: Insufficient documentation

## 2020-04-18 DIAGNOSIS — Z8669 Personal history of other diseases of the nervous system and sense organs: Secondary | ICD-10-CM | POA: Diagnosis not present

## 2020-04-18 DIAGNOSIS — D509 Iron deficiency anemia, unspecified: Secondary | ICD-10-CM | POA: Insufficient documentation

## 2020-04-18 DIAGNOSIS — Z79899 Other long term (current) drug therapy: Secondary | ICD-10-CM | POA: Diagnosis not present

## 2020-04-18 DIAGNOSIS — F4310 Post-traumatic stress disorder, unspecified: Secondary | ICD-10-CM | POA: Diagnosis not present

## 2020-04-18 DIAGNOSIS — R5383 Other fatigue: Secondary | ICD-10-CM | POA: Insufficient documentation

## 2020-04-18 DIAGNOSIS — Z87891 Personal history of nicotine dependence: Secondary | ICD-10-CM | POA: Insufficient documentation

## 2020-04-18 DIAGNOSIS — G40909 Epilepsy, unspecified, not intractable, without status epilepticus: Secondary | ICD-10-CM | POA: Diagnosis not present

## 2020-04-18 DIAGNOSIS — Z8673 Personal history of transient ischemic attack (TIA), and cerebral infarction without residual deficits: Secondary | ICD-10-CM | POA: Diagnosis not present

## 2020-04-18 LAB — CBC WITH DIFFERENTIAL/PLATELET
Abs Immature Granulocytes: 0.02 10*3/uL (ref 0.00–0.07)
Basophils Absolute: 0 10*3/uL (ref 0.0–0.1)
Basophils Relative: 0 %
Eosinophils Absolute: 0.1 10*3/uL (ref 0.0–0.5)
Eosinophils Relative: 2 %
HCT: 35.3 % — ABNORMAL LOW (ref 36.0–46.0)
Hemoglobin: 11.3 g/dL — ABNORMAL LOW (ref 12.0–15.0)
Immature Granulocytes: 0 %
Lymphocytes Relative: 23 %
Lymphs Abs: 1.3 10*3/uL (ref 0.7–4.0)
MCH: 28.7 pg (ref 26.0–34.0)
MCHC: 32 g/dL (ref 30.0–36.0)
MCV: 89.6 fL (ref 80.0–100.0)
Monocytes Absolute: 0.3 10*3/uL (ref 0.1–1.0)
Monocytes Relative: 5 %
Neutro Abs: 4 10*3/uL (ref 1.7–7.7)
Neutrophils Relative %: 70 %
Platelets: 209 10*3/uL (ref 150–400)
RBC: 3.94 MIL/uL (ref 3.87–5.11)
RDW: 12.6 % (ref 11.5–15.5)
WBC: 5.7 10*3/uL (ref 4.0–10.5)
nRBC: 0 % (ref 0.0–0.2)

## 2020-04-18 LAB — BASIC METABOLIC PANEL
Anion gap: 5 (ref 5–15)
BUN: 10 mg/dL (ref 6–20)
CO2: 26 mmol/L (ref 22–32)
Calcium: 9 mg/dL (ref 8.9–10.3)
Chloride: 107 mmol/L (ref 98–111)
Creatinine, Ser: 0.76 mg/dL (ref 0.44–1.00)
GFR, Estimated: >60 mL/min (ref >60)
Glucose, Bld: 106 mg/dL — ABNORMAL HIGH (ref 70–99)
Potassium: 4 mmol/L (ref 3.5–5.1)
Sodium: 138 mmol/L (ref 135–145)

## 2020-04-18 LAB — IRON AND TIBC
Iron: 95 ug/dL (ref 28–170)
Saturation Ratios: 29 % (ref 10.4–31.8)
TIBC: 323 ug/dL (ref 250–450)
UIBC: 228 ug/dL

## 2020-04-18 LAB — FERRITIN: Ferritin: 13 ng/mL (ref 11–307)

## 2020-04-18 MED ORDER — IRON SUCROSE 20 MG/ML IV SOLN
200.0000 mg | Freq: Once | INTRAVENOUS | Status: AC
  Administered 2020-04-18: 200 mg via INTRAVENOUS
  Filled 2020-04-18: qty 10

## 2020-04-18 MED ORDER — SODIUM CHLORIDE 0.9 % IV SOLN
Freq: Once | INTRAVENOUS | Status: AC
  Filled 2020-04-18: qty 250

## 2020-04-18 NOTE — Progress Notes (Signed)
Has been feeling very tired lately.

## 2020-04-18 NOTE — Progress Notes (Signed)
[facility] CONSULT NOTE  Patient Care Team: Lyndon Code, MD as PCP - General (Internal Medicine)  CHIEF COMPLAINTS/PURPOSE OF CONSULTATION: Anemia.  HEMATOLOGY HISTORY  # ANEMIA colonoscopy-2016 [Dr.Wohl]; FERRITIN -9 [LOW; [facility]]-hemoglobin 11.3 s/p IV iron infusion-no improvement of symptoms[fatigue and dizziness]  # ? Seizure [ Neurology; [facility]; [facility]]; history of stroke/bipolar/PTSD [followed by psychiatry]; heavy menstrual cycles.   HISTORY OF PRESENTING ILLNESS:  Brittney Duran 50 y.o.  female with low iron levels/mild anemia is here for follow-up.  Patient continues to complain of extreme fatigue.  Denies any shortness of breath or cough.  No blood in stools or black or stools.  Review of Systems  Constitutional: Positive for malaise/fatigue. Negative for chills, diaphoresis, fever and weight loss.  HENT: Negative for nosebleeds and sore throat.   Eyes: Negative for double vision.  Respiratory: Negative for cough, hemoptysis, sputum production, shortness of breath and wheezing.   Cardiovascular: Negative for chest pain, palpitations, orthopnea and leg swelling.  Gastrointestinal: Negative for abdominal pain, blood in stool, constipation, diarrhea, heartburn, melena, nausea and vomiting.  Musculoskeletal: Positive for joint pain. Negative for back pain.  Skin: Negative.  Negative for itching and rash.  Neurological: Negative for dizziness, tingling, focal weakness, weakness and headaches.  Endo/Heme/Allergies: Does not bruise/bleed easily.  Psychiatric/Behavioral: Negative for depression. The patient is not nervous/anxious and does not have insomnia.     MEDICAL HISTORY:  Past Medical History:  Diagnosis Date  . Anemia   . Anxiety    Panic attacks  . Complication of anesthesia    pt reports "local" wears off quickly  . Depression   . Environmental allergies   . Hemorrhoids   . Leaky heart valve   . Sciatica 09/08/2017  . Stroke (HCC)   . Wears  contact lenses     SURGICAL HISTORY: Past Surgical History:  Procedure Laterality Date  . Caesaran section  1989  . COLONOSCOPY WITH PROPOFOL N/A 10/07/2014   Procedure: COLONOSCOPY WITH PROPOFOL;  Surgeon: [name], MD;  Location: [facility];  Service: Endoscopy;  Laterality: N/A;  Latex  . HERNIA REPAIR  1978    SOCIAL HISTORY: Social History   Socioeconomic History  . Marital status: Married    Spouse name: duglaus Bowen  . Number of children: 5  . Years of education: 84  . Highest education level: Some college, no degree  Occupational History  . Occupation: [employer]  Tobacco Use  . Smoking status: Former Smoker    Years: 15.00  . Smokeless tobacco: Never Used  . Tobacco comment: stopped at age 37 yro  Vaping Use  . Vaping Use: Never used  Substance and Sexual Activity  . Alcohol use: Yes    Alcohol/week: 0.0 standard drinks    Comment: drinks red wine 2-3 times a month  . Drug use: No  . Sexual activity: Yes    Birth control/protection: None    Comment: Paragard  Other Topics Concern  . Not on file  Social History Narrative   Lives at home w/ her husband and children; Left-handed; Drinks 1 cup of coffee per day. Lives in [place]; used to work for [employer]/ awaiting for disability [sec to stroke]; no smoking; rare alcohol/wine.    Social Determinants of Health   Financial Resource Strain: Not on file  Food Insecurity: Not on file  Transportation Needs: Not on file  Physical Activity: Not on file  Stress: Not on file  Social Connections: Not on file  Intimate  Partner Violence: Not on file    FAMILY HISTORY: Family History  Problem Relation Age of Onset  . Hypertension Mother   . Seizures Brother   . Pancreatic cancer Other        pat- aunt.     ALLERGIES:  is allergic to lithium, other, penicillins, shellfish allergy, and latex.  MEDICATIONS:  Current Outpatient Medications  Medication Sig Dispense Refill  . aspirin 81 MG tablet Take  162 mg by mouth daily.    Marland Kitchen buPROPion (WELLBUTRIN XL) 150 MG 24 hr tablet Take 1 tablet by mouth daily 90 tablet 0  . cetirizine-pseudoephedrine (ZYRTEC-D) 5-120 MG per tablet Take 1 tablet by mouth 2 (two) times daily as needed for allergies.    . Cholecalciferol (VITAMIN D3) 5000 units CAPS Take 1 capsule by mouth daily.    . ferrous sulfate 325 (65 FE) MG tablet Take 325 mg by mouth daily with breakfast.    . fluticasone (FLONASE) 50 MCG/ACT nasal spray Place 2 sprays into both nostrils daily. (Patient not taking: Reported on 04/22/2020) 16 g 6  . hydrOXYzine (VISTARIL) 25 MG capsule Take 1 capsule (25 mg total) by mouth 2 (two) times daily as needed. For anxiety attacks 180 capsule 1  . ibuprofen (ADVIL,MOTRIN) 800 MG tablet Take 1 tablet (800 mg total) by mouth every 8 (eight) hours as needed for moderate pain. 15 tablet 0  . lamoTRIgine (LAMICTAL) 200 MG tablet Take 0.5 tablets (100 mg total) by mouth 2 (two) times daily. 90 tablet 0  . prazosin (MINIPRESS) 1 MG capsule Take 1 capsule (1 mg total) by mouth at bedtime. Night mares 90 capsule 1  . triamcinolone (KENALOG) 0.025 % cream Apply topically. (Patient not taking: Reported on 04/22/2020)    . vitamin B-12 (CYANOCOBALAMIN) 1000 MCG tablet Take 1,000 mcg by mouth daily as needed (energy). Reported on 05/08/2015    . zinc gluconate 50 MG tablet Take 50 mg by mouth daily.    . ARIPiprazole (ABILIFY) 20 MG tablet Take 1 tablet (20 mg total) by mouth daily. 90 tablet 0  . Eszopiclone 3 MG TABS Take 1 tablet (3 mg total) by mouth at bedtime. Take immediately before bedtime 30 tablet 0   No current facility-administered medications for this visit.      PHYSICAL EXAMINATION:   Vitals:   04/18/20 1411  BP: (!) 128/59  Pulse: 72  Resp: 16  Temp: (!) 96.2 F (35.7 C)  SpO2: 100%   Filed Weights   04/18/20 1411  Weight: 235 lb 12.8 oz (107 kg)    Physical Exam HENT:     Head: Normocephalic and atraumatic.     Mouth/Throat:      Pharynx: No oropharyngeal exudate.  Eyes:     Pupils: Pupils are equal, round, and reactive to light.  Cardiovascular:     Rate and Rhythm: Normal rate and regular rhythm.  Pulmonary:     Effort: No respiratory distress.     Breath sounds: No wheezing.  Abdominal:     General: Bowel sounds are normal. There is no distension.     Palpations: Abdomen is soft. There is no mass.     Tenderness: There is no abdominal tenderness. There is no guarding or rebound.  Musculoskeletal:        General: No tenderness. Normal range of motion.     Cervical back: Normal range of motion and neck supple.  Skin:    General: Skin is warm.  Neurological:  Mental Status: She is alert and oriented to person, place, and time.  Psychiatric:        Mood and Affect: Affect normal.     LABORATORY DATA:  I have reviewed the data as listed Lab Results  Component Value Date   WBC 6.4 04/22/2020   HGB 11.6 (L) 04/22/2020   HCT 35.5 (L) 04/22/2020   MCV 88.8 04/22/2020   PLT 207 04/22/2020   Recent Labs    08/31/19 1322 10/19/19 1256 04/18/20 1357 04/22/20 1406  NA 137 138 138 136  K 4.0 4.1 4.0 4.1  CL 105 106 107 106  CO2 26 23 26 24   GLUCOSE 99 98 106* 94  BUN 9 18 10 11   CREATININE 0.62 0.79 0.76 0.69  CALCIUM 9.4 8.9 9.0 9.6  GFRNONAA >60 >60 >60 >60  GFRAA >60 >60  --   --   PROT 7.1  --   --   --   ALBUMIN 4.3  --   --   --   AST 16  --   --   --   ALT 12  --   --   --   ALKPHOS 41  --   --   --   BILITOT 1.1  --   --   --      No results found.  Iron deficiency # Anemia hemoglobin 11.3; FERRITIN-9 [[facility]] S/p IV iron infusion; no significant improvement in fatigue noted post infusion.   Today hemoglobin is 12.6.  Continue p.o. iron.  Recommend Venofer today.  Await iron studies today.  # Fatigue/dizziness- ? Etiology; sec to Psyche medications. UNLIKLEy sec to Iron deficiency anemia.  Defer to psychiatry.  # Family history of malignancy [stomach; pancreatic cancers]; discussed  with patient. For genetic counseling-recommend genetic counseling; interested. Will make a referral.   # DISPOSITION: # today Venofer # referral to genetics re: family hx of cancer # follow up in 6 month- MD; labs- cbc/bmp/ironstudies/feritin- possible venofer-Dr.B   All questions were answered. The patient knows to call the clinic with any problems, questions or concerns.   , MD 04/23/2020 7:48 AM

## 2020-04-18 NOTE — Assessment & Plan Note (Addendum)
#   Anemia hemoglobin 11.3; FERRITIN-9 [[facility]] S/p IV iron infusion; no significant improvement in fatigue noted post infusion.   Today hemoglobin is 12.6.  Continue p.o. iron.  Recommend Venofer today.  Await iron studies today.  # Fatigue/dizziness- ? Etiology; sec to Psyche medications. UNLIKLEy sec to Iron deficiency anemia.  Defer to psychiatry.  # Family history of malignancy [stomach; pancreatic cancers]; discussed with patient. For genetic counseling-recommend genetic counseling; interested. Will make a referral.   # DISPOSITION: # today Venofer # referral to genetics re: family hx of cancer # follow up in 6 month- MD; labs- cbc/bmp/ironstudies/feritin- possible venofer-Dr.B

## 2020-04-21 ENCOUNTER — Telehealth: Payer: Self-pay

## 2020-04-21 NOTE — Telephone Encounter (Signed)
a message was left that Labwork is needed to be done ASAP and to notify office when she has done the labwork.

## 2020-04-22 ENCOUNTER — Other Ambulatory Visit
Admission: RE | Admit: 2020-04-22 | Discharge: 2020-04-22 | Disposition: A | Discharge status: Home / Self Care | Payer: 59 | Attending: Psychiatry | Admitting: Psychiatry

## 2020-04-22 ENCOUNTER — Encounter: Payer: Self-pay | Admitting: Psychiatry

## 2020-04-22 ENCOUNTER — Ambulatory Visit (INDEPENDENT_AMBULATORY_CARE_PROVIDER_SITE_OTHER): Payer: 59 | Admitting: Psychiatry

## 2020-04-22 ENCOUNTER — Other Ambulatory Visit: Payer: Self-pay

## 2020-04-22 VITALS — BP 124/77 | HR 77 | Ht 67.0 in | Wt 234.0 lb

## 2020-04-22 DIAGNOSIS — F5105 Insomnia due to other mental disorder: Secondary | ICD-10-CM | POA: Diagnosis not present

## 2020-04-22 DIAGNOSIS — F410 Panic disorder [episodic paroxysmal anxiety] without agoraphobia: Secondary | ICD-10-CM | POA: Diagnosis not present

## 2020-04-22 DIAGNOSIS — G2111 Neuroleptic induced parkinsonism: Secondary | ICD-10-CM

## 2020-04-22 DIAGNOSIS — F250 Schizoaffective disorder, bipolar type: Secondary | ICD-10-CM

## 2020-04-22 DIAGNOSIS — E611 Iron deficiency: Secondary | ICD-10-CM

## 2020-04-22 DIAGNOSIS — Z79899 Other long term (current) drug therapy: Secondary | ICD-10-CM

## 2020-04-22 LAB — CBC WITH DIFFERENTIAL/PLATELET
Abs Immature Granulocytes: 0.02 10*3/uL (ref 0.00–0.07)
Basophils Absolute: 0 10*3/uL (ref 0.0–0.1)
Basophils Relative: 1 %
Eosinophils Absolute: 0.2 10*3/uL (ref 0.0–0.5)
Eosinophils Relative: 4 %
HCT: 35.5 % — ABNORMAL LOW (ref 36.0–46.0)
Hemoglobin: 11.6 g/dL — ABNORMAL LOW (ref 12.0–15.0)
Immature Granulocytes: 0 %
Lymphocytes Relative: 22 %
Lymphs Abs: 1.4 10*3/uL (ref 0.7–4.0)
MCH: 29 pg (ref 26.0–34.0)
MCHC: 32.7 g/dL (ref 30.0–36.0)
MCV: 88.8 fL (ref 80.0–100.0)
Monocytes Absolute: 0.4 10*3/uL (ref 0.1–1.0)
Monocytes Relative: 6 %
Neutro Abs: 4.3 10*3/uL (ref 1.7–7.7)
Neutrophils Relative %: 67 %
Platelets: 207 10*3/uL (ref 150–400)
RBC: 4 MIL/uL (ref 3.87–5.11)
RDW: 12.8 % (ref 11.5–15.5)
WBC: 6.4 10*3/uL (ref 4.0–10.5)
nRBC: 0 % (ref 0.0–0.2)

## 2020-04-22 LAB — IRON AND TIBC
Iron: 123 ug/dL (ref 28–170)
Saturation Ratios: 37 % — ABNORMAL HIGH (ref 10.4–31.8)
TIBC: 335 ug/dL (ref 250–450)
UIBC: 212 ug/dL

## 2020-04-22 LAB — FERRITIN: Ferritin: 78 ng/mL (ref 11–307)

## 2020-04-22 LAB — BASIC METABOLIC PANEL
Anion gap: 6 (ref 5–15)
BUN: 11 mg/dL (ref 6–20)
CO2: 24 mmol/L (ref 22–32)
Calcium: 9.6 mg/dL (ref 8.9–10.3)
Chloride: 106 mmol/L (ref 98–111)
Creatinine, Ser: 0.69 mg/dL (ref 0.44–1.00)
GFR, Estimated: >60 mL/min (ref >60)
Glucose, Bld: 94 mg/dL (ref 70–99)
Potassium: 4.1 mmol/L (ref 3.5–5.1)
Sodium: 136 mmol/L (ref 135–145)

## 2020-04-22 MED ORDER — ESZOPICLONE 3 MG PO TABS: 3.0000 mg | ORAL_TABLET | Freq: Every day | ORAL | 0 refills | Status: DC

## 2020-04-22 MED ORDER — ARIPIPRAZOLE 20 MG PO TABS: 20.0000 mg | ORAL_TABLET | Freq: Every day | ORAL | 0 refills | Status: DC
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
## 2020-05-13 ENCOUNTER — Inpatient Hospital Stay: Payer: 59

## 2020-05-13 ENCOUNTER — Encounter: Payer: Self-pay | Admitting: Licensed Clinical Social Worker

## 2020-05-13 ENCOUNTER — Other Ambulatory Visit: Payer: Self-pay

## 2020-05-13 ENCOUNTER — Inpatient Hospital Stay (HOSPITAL_BASED_OUTPATIENT_CLINIC_OR_DEPARTMENT_OTHER): Payer: 59 | Admitting: Licensed Clinical Social Worker

## 2020-05-13 DIAGNOSIS — Z800 Family history of malignant neoplasm of digestive organs: Secondary | ICD-10-CM | POA: Diagnosis not present

## 2020-05-13 DIAGNOSIS — Z1379 Encounter for other screening for genetic and chromosomal anomalies: Secondary | ICD-10-CM | POA: Diagnosis not present

## 2020-05-13 DIAGNOSIS — Z8042 Family history of malignant neoplasm of prostate: Secondary | ICD-10-CM | POA: Diagnosis not present

## 2020-05-13 DIAGNOSIS — Z803 Family history of malignant neoplasm of breast: Secondary | ICD-10-CM

## 2020-05-13 NOTE — Progress Notes (Signed)
REFERRING PROVIDER: Cammie Sickle, MD [place],  [place] [address]  PRIMARY PROVIDER:  Lavera Guise, MD  PRIMARY REASON FOR VISIT:  1. Family history of breast cancer   2. Family history of pancreatic cancer   3. Family history of prostate cancer   4. Family history of stomach cancer      HISTORY OF PRESENT ILLNESS:   Brittney Duran, a 50 y.o. female, was seen for a [facility] cancer genetics consultation at the request of Dr. Rogue Bussing due to a family history of cancer.  Brittney Duran presents to clinic today to discuss the possibility of a hereditary predisposition to cancer, genetic testing, and to further clarify her future cancer risks, as well as potential cancer risks for family members.   Brittney Duran is a 50 y.o. female with no personal history of cancer.    CANCER HISTORY:  Oncology History   No history exists.     RISK FACTORS:  Menarche was at age 50.  First live birth at age 6.  OCP use for short time Ovaries intact: yes.  Hysterectomy: no.  Menopausal status: premenopausal; possibly going through menopause HRT use: 0 years. Colonoscopy: yes; normal. Mammogram within the last year: yes. Number of breast biopsies: 0. Up to date with pelvic exams: yes. Any excessive radiation exposure in the past: no  Past Medical History:  Diagnosis Date  . Anemia   . Anxiety    Panic attacks  . Complication of anesthesia    pt reports "local" wears off quickly  . Depression   . Environmental allergies   . Family history of breast cancer   . Family history of pancreatic cancer   . Family history of prostate cancer   . Family history of stomach cancer   . Hemorrhoids   . Leaky heart valve   . Sciatica 09/08/2017  . Stroke ([place])   . Wears contact lenses     Past Surgical History:  Procedure Laterality Date  . Caesaran section  1989  . COLONOSCOPY WITH PROPOFOL N/A 10/07/2014   Procedure: COLONOSCOPY WITH PROPOFOL;  Surgeon: Lucilla Lame, MD;   Location: [facility];  Service: Endoscopy;  Laterality: N/A;  Latex  . HERNIA REPAIR  1978    Social History   Socioeconomic History  . Marital status: Married    Spouse name: duglaus Sonnenberg  . Number of children: 5  . Years of education: 29  . Highest education level: Some college, no degree  Occupational History  . Occupation: [employer]  Tobacco Use  . Smoking status: Former Smoker    Years: 15.00  . Smokeless tobacco: Never Used  . Tobacco comment: stopped at age 58 yro  Vaping Use  . Vaping Use: Never used  Substance and Sexual Activity  . Alcohol use: Yes    Alcohol/week: 0.0 standard drinks    Comment: drinks red wine 2-3 times a month  . Drug use: No  . Sexual activity: Yes    Birth control/protection: None    Comment: Paragard  Other Topics Concern  . Not on file  Social History Narrative   Lives at home w/ her husband and children; Left-handed; Drinks 1 cup of coffee per day. Lives in [place]; used to work for [employer]/ awaiting for disability [sec to stroke]; no smoking; rare alcohol/wine.    Social Determinants of Health   Financial Resource Strain: Not on file  Food Insecurity: Not on file  Transportation Needs: Not on file  Physical  Activity: Not on file  Stress: Not on file  Social Connections: Not on file     FAMILY HISTORY:  We obtained a detailed, 4-generation family history.  Significant diagnoses are listed below: Family History  Problem Relation Age of Onset  . Hypertension Mother   . Seizures Brother   . Stomach cancer Maternal Aunt 13  . Breast cancer Paternal Aunt        dx 75s  . Pancreatic cancer Paternal Aunt 89  . Prostate cancer Paternal Grandfather        dx 52s  . Kidney failure Brother   . Pancreatic cancer Other   . Brain cancer Maternal Great-grandmother   . Breast cancer Cousin        dx 29s   Brittney Duran has 3 daughters and 2 sons, none have had cancer. She had 2 brothers, one passed due to kidney failure at 32, the  other is living with no history of cancer.  Brittney Duran mother is living at 48, no history of cancer. Patient had 2 maternal uncles, 1 aunt. Her aunt passed from stomach cancer at 17. Maternal grandfather passed in his 36s. Grandmother passed at 46. Her mother, patient's great grandmother, died of brain cancer. Grandmother's sister's child (patient's first cousin once removed) had breast cancer in her 20s and is living at 55.   Brittney Duran father is living at 74. Patient had 1 paternal aunt and 3 paternal uncles. Her aunt had breast cancer in her 36s and pancreatic cancer at 90, passed at 46. Paternal grandmother died at 66. Grandfather has prostate cancer in his 9s and died at 52. His sister had pancreatic cancer as well, diagnosed at 12 and died at 81.  Brittney Duran is unaware of previous family history of genetic testing for hereditary cancer risks. Patient's maternal ancestors are of [place] and [place] descent, and paternal ancestors are of [place] and [place] descent. There is no reported Ashkenazi Jewish ancestry. There is no known consanguinity.   GENETIC COUNSELING ASSESSMENT: Brittney Duran is a 50 y.o. female with a family history of pancreatic and breast cancer which is somewhat suggestive of a hereditary cancer syndrome and predisposition to cancer. We, therefore, discussed and recommended the following at today's visit.   DISCUSSION: We discussed that approximately 5-10% of breast/pancreatic cancer is hereditary  Most cases of hereditary breast and pancreatic cancer are associated with BRCA1/BRCA2 genes, although there are other genes associated with hereditary cancer as well . There are also genes we can test associated with prostate, stomach, and brain cancers. Cancers and risks are gene specific.  We discussed that testing is beneficial for several reasons including  knowing about cancer risks, identifying potential screening and risk-reduction options that may be appropriate, and to  understand if other family members could be at risk for cancer and allow them to undergo genetic testing.   We reviewed the characteristics, features and inheritance patterns of hereditary cancer syndromes. We also discussed genetic testing, including the appropriate family members to test, the process of testing, insurance coverage and turn-around-time for results. We discussed the implications of a negative, positive and/or variant of uncertain significant result. We recommended Brittney Duran pursue genetic testing for the [facility] Multi-Cancer+RNA gene panel.   The Multi-Cancer Panel + RNA offered by [facility] includes sequencing and/or deletion duplication testing of the following 84 genes: AIP, ALK, APC, ATM, AXIN2,BAP1,  BARD1, BLM, BMPR1A, BRCA1, BRCA2, BRIP1, CASR, CDC73, CDH1, CDK4, CDKN1B, CDKN1C, CDKN2A (p14ARF), CDKN2A (p16INK4a), CEBPA,  CHEK2, CTNNA1, DICER1, DIS3L2, EGFR (c.2369C>T, p.Thr790Met variant only), EPCAM (Deletion/duplication testing only), FH, FLCN, GATA2, GPC3, GREM1 (Promoter region deletion/duplication testing only), HOXB13 (c.251G>A, p.Gly84Glu), HRAS, KIT, MAX, MEN1, MET, MITF (c.952G>A, p.Glu318Lys variant only), MLH1, MSH2, MSH3, MSH6, MUTYH, NBN, NF1, NF2, NTHL1, PALB2, PDGFRA, PHOX2B, PMS2, POLD1, POLE, POT1, PRKAR1A, PTCH1, PTEN, RAD50, RAD51C, RAD51D, RB1, RECQL4, RET, RUNX1, SDHAF2, SDHA (sequence changes only), SDHB, SDHC, SDHD, SMAD4, SMARCA4, SMARCB1, SMARCE1, STK11, SUFU, TERC, TERT, TMEM127, TP53, TSC1, TSC2, VHL, WRN and WT1.  Based on Brittney Duran's family history of cancer, she meets medical criteria for genetic testing. Despite that she meets criteria, she may still have an out of pocket cost. We discussed that if her out of pocket cost for testing is over $100, the laboratory will call and confirm whether she wants to proceed with testing.  If the out of pocket cost of testing is less than $100 she will be billed by the genetic testing laboratory.   We discussed that some  people do not want to undergo genetic testing due to fear of genetic discrimination.  A federal law called the Genetic Information Non-Discrimination Act (GINA) of 2008 helps protect individuals against genetic discrimination based on their genetic test results.  It impacts both health insurance and employment.  For health insurance, it protects against increased premiums, being kicked off insurance or being forced to take a test in order to be insured.  For employment it protects against hiring, firing and promoting decisions based on genetic test results.  Health status due to a cancer diagnosis is not protected under GINA.  This law does not protect life insurance, disability insurance, or other types of insurance.    PLAN: After considering the risks, benefits, and limitations, Brittney Duran provided informed consent to pursue genetic testing and the blood sample was sent to [facility] for analysis of the Multi-Cancer Panel+RNA. Results should be available within approximately 2-3 weeks' time, at which point they will be disclosed by telephone to Brittney Duran, as will any additional recommendations warranted by these results. Brittney Duran will receive a summary of her genetic counseling visit and a copy of her results once available. This information will also be available in Epic.   Brittney Duran questions were answered to her satisfaction today. Our contact information was provided should additional questions or concerns arise. Thank you for the referral and allowing [place] to share in the care of your patient.   Faith Rogue, MS, [facility] Genetic Counselor [email or web address] Phone: [phone]  The patient was seen for a total of 25 minutes in face-to-face genetic counseling.  Dr. Grayland Ormond was available for discussion regarding this case.   _______________________________________________________________________ For Office Staff:  Number of people involved in session: 1 Was an Intern/ student  involved with case: no

## 2020-05-14 ENCOUNTER — Telehealth: Payer: 59 | Admitting: Psychiatry

## 2020-05-14 ENCOUNTER — Other Ambulatory Visit: Payer: Self-pay

## 2020-05-28 ENCOUNTER — Encounter: Payer: Self-pay | Admitting: Psychiatry

## 2020-05-28 ENCOUNTER — Ambulatory Visit (INDEPENDENT_AMBULATORY_CARE_PROVIDER_SITE_OTHER): Payer: 59 | Admitting: Psychiatry

## 2020-05-28 ENCOUNTER — Other Ambulatory Visit: Payer: Self-pay

## 2020-05-28 VITALS — BP 110/73 | HR 80 | Temp 97.8°F | Wt 234.2 lb

## 2020-05-28 DIAGNOSIS — F410 Panic disorder [episodic paroxysmal anxiety] without agoraphobia: Secondary | ICD-10-CM

## 2020-05-28 DIAGNOSIS — F250 Schizoaffective disorder, bipolar type: Secondary | ICD-10-CM

## 2020-05-28 DIAGNOSIS — G2111 Neuroleptic induced parkinsonism: Secondary | ICD-10-CM | POA: Diagnosis not present

## 2020-05-28 DIAGNOSIS — F5105 Insomnia due to other mental disorder: Secondary | ICD-10-CM | POA: Diagnosis not present

## 2020-05-28 DIAGNOSIS — Z79899 Other long term (current) drug therapy: Secondary | ICD-10-CM

## 2020-05-28 MED ORDER — BUPROPION HCL ER (XL) 150 MG PO TB24: ORAL_TABLET | ORAL | 0 refills | Status: DC

## 2020-05-28 MED ORDER — LAMOTRIGINE 200 MG PO TABS: 100.0000 mg | ORAL_TABLET | Freq: Two times a day (BID) | ORAL | 0 refills | Status: DC

## 2020-05-28 MED ORDER — ESZOPICLONE 3 MG PO TABS: 3.0000 mg | ORAL_TABLET | Freq: Every day | ORAL | 0 refills | Status: DC

## 2020-05-28 NOTE — Progress Notes (Signed)
BH MD OP Progress Note  05/28/2020 5:29 PM Brittney Duran  MRN:  [id number]  Chief Complaint:  Chief Complaint    Follow-up; Depression     HPI: Brittney Duran is a 50 year old African-American female, married, unemployed, lives in [place], has a history of schizoaffective disorder, panic attacks, insomnia, history of TIA was evaluated in office today.  Patient was supposed to have virtual visit however presented to the clinic reporting that she thought it was going to be an in office visit.  Patient today reports since being on the lower dosage of Abilify her muscular jerks have resolved.  She does not have that anymore.  She also reports that reduced dose has not worsened her mood swings.  She continues to have visual hallucinations of seeing shadows out of the corner of her eyes on and off however it does not distress her at this time.  Patient reports sleep is okay and reports the prazosin has helped her a lot with the nightmares.  She does not remember the last time she had nightmares.  Patient however reports she is currently anxious about her child who is 74 years old who is getting tested for autism spectrum.  She reports her child has developed toe walking and has a sensory problem and hence she has to take her child for multiple consultation with specialist and this is very overwhelming for her.  She however reports her husband has been supportive.  She has been working with her therapist on the same.  That does help.  Patient denies any suicidality and reports she had some thoughts recently when she questioned herself and felt it is not worth it however did not have any suicidal thoughts or active plans.  She reports she has started intermittent fasting and that has helped and she is very motivated to keep doing it.  She wants to lose weight.  Overall she has been eating okay.  Patient reports she is interested in applying for Social Security disability again and is planning to  start the process.  She does report she continues to have memory issues however did not elaborate on it.  She reports she is still waiting for neurology visit.  However during the session today patient did not appear to have any word-finding difficulty or problems with her memory.   Patient denies any other concerns today.  Visit Diagnosis:    ICD-10-CM   1. Schizoaffective disorder, bipolar type (HCC)  F25.0 lamoTRIgine (LAMICTAL) 200 MG tablet  2. Panic disorder  F41.0 buPROPion (WELLBUTRIN XL) 150 MG 24 hr tablet  3. Insomnia due to mental condition  F51.05 Eszopiclone 3 MG TABS   anxiety  4. 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 High risk medication use  Z79.899     Past Psychiatric History: I have reviewed past psychiatric history from progress note on 04/18/2017  Past Medical History:  Past Medical History:  Diagnosis Date  . Anemia   . Anxiety    Panic attacks  . Complication of anesthesia    pt reports "local" wears off quickly  . Depression   . Environmental allergies   . Family history of breast cancer   . Family history of pancreatic cancer   . Family history of prostate cancer   . Family history of stomach cancer   . Hemorrhoids   . Leaky heart valve   . Sciatica 09/08/2017  . Stroke (HCC)   . Wears contact lenses     Past  Surgical History:  Procedure Laterality Date  . Caesaran section  1989  . COLONOSCOPY WITH PROPOFOL N/A 10/07/2014   Procedure: COLONOSCOPY WITH PROPOFOL;  Surgeon: [name], MD;  [facility];  Service: Endoscopy;  Laterality: N/A;  Latex  . HERNIA REPAIR  1978    Family Psychiatric History: I have reviewed family psychiatric history from progress note on 04/18/2017  Family History:  Family History  Problem Relation Age of Onset  . Hypertension Mother   . Seizures Brother   . Stomach cancer Maternal Aunt 57  . Breast cancer Paternal Aunt        dx 63s  . Pancreatic cancer Paternal Aunt 40  . Prostate  cancer Paternal Grandfather        dx 98s  . Kidney failure Brother   . Pancreatic cancer Other   . Brain cancer Maternal Great-grandmother   . Breast cancer Cousin        dx 20s    Social History: Reviewed social history from progress note on 04/18/2017 Social History   Socioeconomic History  . Marital status: Married    Spouse name: duglaus Pepitone  . Number of children: 5  . Years of education: 46  . Highest education level: Some college, no degree  Occupational History  . Occupation: [employer]  Tobacco Use  . Smoking status: Former Smoker    Years: 15.00  . Smokeless tobacco: Never Used  . Tobacco comment: stopped at age 23 yro  Vaping Use  . Vaping Use: Never used  Substance and Sexual Activity  . Alcohol use: Yes    Alcohol/week: 0.0 standard drinks    Comment: drinks red wine 2-3 times a month  . Drug use: No  . Sexual activity: Yes    Birth control/protection: None    Comment: Paragard  Other Topics Concern  . Not on file  Social History Narrative   Lives at home w/ her husband and children; Left-handed; Drinks 1 cup of coffee per day. Lives in [place]; used to work for [employer]/ awaiting for disability [sec to stroke]; no smoking; rare alcohol/wine.    Social Determinants of Health   Financial Resource Strain: Not on file  Food Insecurity: Not on file  Transportation Needs: Not on file  Physical Activity: Not on file  Stress: Not on file  Social Connections: Not on file    Allergies:  Allergies  Allergen Reactions  . Lithium Swelling    Face swelling  Face swelling  . Other Swelling    lips  . Penicillins Other (See Comments)    Unknown reaction Has patient had a PCN reaction causing immediate rash, facial/tongue/throat swelling, SOB or lightheadedness with hypotension: YES Has patient had a PCN reaction causing severe rash involving mucus membranes or skin necrosis: NO Has patient had a PCN reaction that required hospitalizationNO Has patient had a  PCN reaction occurring within the last 10 years: NO If all of the above answers are "NO", then may proceed with Cephalosporin use.  . Shellfish Allergy Swelling    lips  . Latex Rash    Metabolic Disorder Labs: Lab Results  Component Value Date   HGBA1C 5.4 05/09/2015   MPG 108 05/09/2015   No results found for: PROLACTIN Lab Results  Component Value Date   CHOL 160 08/31/2019   TRIG 54 08/31/2019   HDL 42 08/31/2019   CHOLHDL 3.8 08/31/2019   VLDL 11 08/31/2019   LDLCALC 107 (H) 08/31/2019   LDLCALC 71 05/09/2015  Lab Results  Component Value Date   TSH 3.003 08/31/2019   TSH 2.093 08/26/2017    Therapeutic Level Labs: No results found for: LITHIUM No results found for: VALPROATE No components found for:  CBMZ  Current Medications: Current Outpatient Medications  Medication Sig Dispense Refill  . ARIPiprazole (ABILIFY) 20 MG tablet Take 1 tablet (20 mg total) by mouth daily. 90 tablet 0  . aspirin 81 MG tablet Take 162 mg by mouth daily.    . cetirizine-pseudoephedrine (ZYRTEC-D) 5-120 MG per tablet Take 1 tablet by mouth 2 (two) times daily as needed for allergies.    . Cholecalciferol (VITAMIN D3) 5000 units CAPS Take 1 capsule by mouth daily.    . ferrous sulfate 325 (65 FE) MG tablet Take 325 mg by mouth daily with breakfast.    . fluticasone (FLONASE) 50 MCG/ACT nasal spray Place 2 sprays into both nostrils daily. 16 g 6  . hydrOXYzine (VISTARIL) 25 MG capsule Take 1 capsule (25 mg total) by mouth 2 (two) times daily as needed. For anxiety attacks 180 capsule 1  . ibuprofen (ADVIL,MOTRIN) 800 MG tablet Take 1 tablet (800 mg total) by mouth every 8 (eight) hours as needed for moderate pain. 15 tablet 0  . prazosin (MINIPRESS) 1 MG capsule Take 1 capsule (1 mg total) by mouth at bedtime. Night mares 90 capsule 1  . triamcinolone (KENALOG) 0.025 % cream Apply topically.    . vitamin B-12 (CYANOCOBALAMIN) 1000 MCG tablet Take 1,000 mcg by mouth daily as needed  (energy). Reported on 05/08/2015    . zinc gluconate 50 MG tablet Take 50 mg by mouth daily.    Marland Kitchen buPROPion (WELLBUTRIN XL) 150 MG 24 hr tablet Take 1 tablet by mouth daily 90 tablet 0  . Eszopiclone 3 MG TABS Take 1 tablet (3 mg total) by mouth at bedtime. Take immediately before bedtime 30 tablet 0  . lamoTRIgine (LAMICTAL) 200 MG tablet Take 0.5 tablets (100 mg total) by mouth 2 (two) times daily. 90 tablet 0   No current facility-administered medications for this visit.     Musculoskeletal: Strength & Muscle Tone: UTA Gait & Station: normal Patient leans: N/A  Psychiatric Specialty Exam: Review of Systems  Psychiatric/Behavioral: Positive for hallucinations. The patient is nervous/anxious.   All other systems reviewed and are negative.   Blood pressure 110/73, pulse 80, temperature 97.8 F (36.6 C), temperature source Temporal, weight 234 lb 3.2 oz (106.2 kg).Body mass index is 36.68 kg/m.  General Appearance: Casual  Eye Contact:  Fair  Speech:  Clear and Coherent  Volume:  Normal  Mood:  Anxious  Affect:  Congruent  Thought Process:  Goal Directed and Descriptions of Associations: Intact  Orientation:  Full (Time, Place, and Person)  Thought Content: Hallucinations: Visual shadows , images out of the corner of her eyes , does not bother her at this time- chronic  Suicidal Thoughts:  No  Homicidal Thoughts:  No  Memory:  Immediate;   Fair Recent;   Fair Remote;   Fair  Judgement:  Fair  Insight:  Fair  Psychomotor Activity:  Normal  Concentration:  Concentration: Fair and Attention Span: Fair  Recall:  [employer] of Knowledge: Fair  Language: Fair  Akathisia:  No  Handed:  Right  AIMS (if indicated): done  Assets:  Communication Skills Desire for Improvement Housing Social Support  ADL's:  Intact  Cognition: WNL  Sleep:  Improving   Screenings: AUDIT   Garment/textile technologist [facility]  [facility]  Alcohol Use Disorder  Identification Test Final Score (AUDIT) 3    GAD-7   Flowsheet Row [facility]  Total GAD-7 Score 8    PHQ2-9   Flowsheet Row [facility]28[facility]2020 [facility] Office Visit from [facility]22/2020 [facility] Video Visit from [facility]2020 [facility] Video Visit [facility]  PHQ-2 Total Score 1 2 3 2  0  PHQ-9 Total Score 9 8 10 10  --    Flowsheet Row [facility] Office Visit from [facility]2020 [facility] Video Visit from 03/19/2020 [facility]  C-SSRS RISK CATEGORY Low Risk No Risk Low Risk       Assessment and Plan: Brittney Duran is a 50 year old African-American female who has a history of bipolar disorder, history of trauma was evaluated in office today.  Patient is biologically predisposed due to multiple health problems.  Patient  continues to have multiple psychosocial stressors including health problems of her child.  She will continue to benefit from medication management and psychotherapy sessions.  Plan Schizoaffective disorder- improving Abilify at reduced dosage of 20 mg p.o. daily due to side effects. Lamictal 200 mg p.o. daily Wellbutrin XL 150 mg p.o. daily Continue CBT with Ms. Felecia Janina Thompson AIMS - 0  Panic attacks-stable Continue CBT Hydroxyzine 25 mg p.o. twice daily as needed  Insomnia-stable Lunesta 3 mg p.o. nightly as needed Per review PMP aware patient likely not taking it every day. Prazosin 1 mg p.o. nightly for nightmares  Memory problems-patient was referred to neurologist and reports she will continue to follow-up with them.  She might benefit from a neuropsychological testing.  High  risk medication use-pending labs, prolactin, hemoglobin A1c.  Lab slip printed out again and provided to patient since she has been noncompliant.  I have coordinated care with Ms. Felecia Janina Thompson, patient will continue to benefit from therapy.  Follow-up in clinic in 4 weeks or sooner if needed.  This note was generated in part or whole with voice recognition software. Voice recognition is usually quite accurate but there are transcription errors that can and very often do occur. I apologize for any typographical errors that were not detected and corrected.       Jomarie LongsSaramma Millie Shorb, MD 05/29/2020, 6:37 PM

## 2020-06-03 ENCOUNTER — Telehealth: Payer: Self-pay | Admitting: Licensed Clinical Social Worker

## 2020-06-03 ENCOUNTER — Ambulatory Visit: Payer: Self-pay | Admitting: Licensed Clinical Social Worker

## 2020-06-03 ENCOUNTER — Encounter: Payer: Self-pay | Admitting: Licensed Clinical Social Worker

## 2020-06-03 DIAGNOSIS — Z1379 Encounter for other screening for genetic and chromosomal anomalies: Secondary | ICD-10-CM | POA: Insufficient documentation

## 2020-06-03 DIAGNOSIS — Z803 Family history of malignant neoplasm of breast: Secondary | ICD-10-CM

## 2020-06-03 DIAGNOSIS — Z13228 Encounter for screening for other metabolic disorders: Secondary | ICD-10-CM | POA: Insufficient documentation

## 2020-06-03 DIAGNOSIS — Z800 Family history of malignant neoplasm of digestive organs: Secondary | ICD-10-CM

## 2020-06-03 DIAGNOSIS — Z8042 Family history of malignant neoplasm of prostate: Secondary | ICD-10-CM

## 2020-06-03 DIAGNOSIS — Z130 Encounter for screening for diseases of the blood and blood-forming organs and certain disorders involving the immune mechanism: Secondary | ICD-10-CM | POA: Insufficient documentation

## 2020-06-03 NOTE — Telephone Encounter (Signed)
Revealed negative genetic testing.  This normal result is reassuring.  It is unlikely that there is an increased risk of cancer due to a mutation in one of these genes.  However, genetic testing is not perfect, and cannot definitively rule out a hereditary cause.  It will be important for her to keep in contact with genetics to learn if any additional testing may be needed in the future.      

## 2020-06-03 NOTE — Progress Notes (Signed)
HPI:  [name] was previously seen in the [place] clinic due to a  Family history of cancer and concerns regarding a hereditary predisposition to cancer. Please refer to our prior cancer genetics clinic note for more information regarding our discussion, assessment and recommendations, at the time. [name] recent genetic test results were disclosed to her, as were recommendations warranted by these results. These results and recommendations are discussed in more detail below.  CANCER HISTORY:  Oncology History   No history exists.    FAMILY HISTORY:  We obtained a detailed, 4-generation family history.  Significant diagnoses are listed below: Family History  Problem Relation Age of Onset  . Hypertension Mother   . Seizures Brother   . Stomach cancer Maternal Aunt 76  . Breast cancer Paternal Aunt        dx 17s  . Pancreatic cancer Paternal Aunt 52  . Prostate cancer Paternal Grandfather        dx 88s  . Kidney failure Brother   . Pancreatic cancer Other   . Brain cancer Maternal Great-grandmother   . Breast cancer Cousin        dx 71s    [name] has 3 daughters and 2 sons, none have had cancer. She had 2 brothers, one passed due to kidney failure at 30, the other is living with no history of cancer.  [name] mother is living at 51, no history of cancer. Patient had 2 maternal uncles, 1 aunt. Her aunt passed from stomach cancer at 35. Maternal grandfather passed in his 69s. Grandmother passed at 59. Her mother, patient's great grandmother, died of brain cancer. Grandmother's sister's child (patient's first cousin once removed) had breast cancer in her 31s and is living at 68.   [name] father is living at 28. Patient had 1 paternal aunt and 3 paternal uncles. Her aunt had breast cancer in her 62s and pancreatic cancer at 82, passed at 18. Paternal grandmother died at 77. Grandfather has prostate cancer in his 5s and died at 54. His sister had pancreatic  cancer as well, diagnosed at 21 and died at 54.  [name] is unaware of previous family history of genetic testing for hereditary cancer risks. Patient's maternal ancestors are of [place] and [place] descent, and paternal ancestors are of [place] and [place] descent. There is no reported Ashkenazi Jewish ancestry. There is no known consanguinity.    GENETIC TEST RESULTS: Genetic testing reported out on 05/31/2020 through the [facility] Multi-Cancer Panel+RNA cancer panel found no pathogenic mutations.   The Multi-Cancer Panel + RNA offered by [facility] includes sequencing and/or deletion duplication testing of the following 84 genes: AIP, ALK, APC, ATM, AXIN2,BAP1,  BARD1, BLM, BMPR1A, BRCA1, BRCA2, BRIP1, CASR, CDC73, CDH1, CDK4, CDKN1B, CDKN1C, CDKN2A (p14ARF), CDKN2A (p16INK4a), CEBPA, CHEK2, CTNNA1, DICER1, DIS3L2, EGFR (c.2369C>T, p.Thr790Met variant only), EPCAM (Deletion/duplication testing only), FH, FLCN, GATA2, GPC3, GREM1 (Promoter region deletion/duplication testing only), HOXB13 (c.251G>A, p.Gly84Glu), HRAS, KIT, MAX, MEN1, MET, MITF (c.952G>A, p.Glu318Lys variant only), MLH1, MSH2, MSH3, MSH6, MUTYH, NBN, NF1, NF2, NTHL1, PALB2, PDGFRA, PHOX2B, PMS2, POLD1, POLE, POT1, PRKAR1A, PTCH1, PTEN, RAD50, RAD51C, RAD51D, RB1, RECQL4, RET, RUNX1, SDHAF2, SDHA (sequence changes only), SDHB, SDHC, SDHD, SMAD4, SMARCA4, SMARCB1, SMARCE1, STK11, SUFU, TERC, TERT, TMEM127, TP53, TSC1, TSC2, VHL, WRN and WT1.   The test report has been scanned into EPIC and is located under the Molecular Pathology section of the Results Review tab.  A portion of the result report  is included below for reference.    We discussed with [name] that because current genetic testing is not perfect, it is possible there may be a gene mutation in one of these genes that current testing cannot detect, but that chance is small.  We also discussed, that there could be another gene that has not yet been discovered, or  that we have not yet tested, that is responsible for the cancer diagnoses in the family. It is also possible there is a hereditary cause for the cancer in the family that [name] did not inherit and therefore was not identified in her testing.  Therefore, it is important to remain in touch with cancer genetics in the future so that we can continue to offer [name] the most up to date genetic testing.   ADDITIONAL GENETIC TESTING: We discussed with [name] that her genetic testing was fairly extensive.  If there are genes identified to increase cancer risk that can be analyzed in the future, we would be happy to discuss and coordinate this testing at that time.    CANCER SCREENING RECOMMENDATIONS: [name] test result is considered negative (normal).  This means that we have not identified a hereditary cause for her  family history of cancer at this time.   While reassuring, this does not definitively rule out a hereditary predisposition to cancer. It is still possible that there could be genetic mutations that are undetectable by current technology. There could be genetic mutations in genes that have not been tested or identified to increase cancer risk.  Therefore, it is recommended she continue to follow the cancer management and screening guidelines provided by her  primary healthcare provider.   An individual's cancer risk and medical management are not determined by genetic test results alone. Overall cancer risk assessment incorporates additional factors, including personal medical history, family history, and any available genetic information that may result in a personalized plan for cancer prevention and surveillance.   Based on [name]'s personal and family history of cancer as well as her genetic test results, risk model Harriett Rush was used to estimate her risk of developing breast cancer. This estimates her lifetime risk of developing breast cancer to be approximately 10%.  The  patient's lifetime breast cancer risk is a preliminary estimate based on available information using one of several models endorsed by the [employer] (ACS). The ACS recommends consideration of breast MRI screening as an adjunct to mammography for patients at high risk (defined as 20% or greater lifetime risk).    RECOMMENDATIONS FOR FAMILY MEMBERS:  Relatives in this family might be at some increased risk of developing cancer, over the general population risk, simply due to the family history of cancer.  We recommended female relatives in this family have a yearly mammogram beginning at age 79, or 26 years younger than the earliest onset of cancer, an annual clinical breast exam, and perform monthly breast self-exams. Female relatives in this family should also have a gynecological exam as recommended by their primary provider.  All family members should be referred for colonoscopy starting at age 63.     It is also possible there is a hereditary cause for the cancer in [name]'s family that she did not inherit and therefore was not identified in her.  Based on [name]'s family history, we recommended paternal relatives have genetic counseling and testing. [name] will let us know if we can be of any assistance in  coordinating genetic counseling and/or testing for these family members.  FOLLOW-UP: Lastly, we discussed with [name] that cancer genetics is a rapidly advancing field and it is possible that new genetic tests will be appropriate for her and/or her family members in the future. We encouraged her to remain in contact with cancer genetics on an annual basis so we can update her personal and family histories and let her know of advances in cancer genetics that may benefit this family.   Our contact number was provided. [name] questions were answered to her satisfaction, and she knows she is welcome to call us at anytime with additional questions or concerns.   Faith Rogue,  MS, [facility] Genetic Counselor [email or web address] Phone: [phone]

## 2020-06-17 ENCOUNTER — Other Ambulatory Visit: Payer: Self-pay | Admitting: Neurology

## 2020-06-17 DIAGNOSIS — G379 Demyelinating disease of central nervous system, unspecified: Secondary | ICD-10-CM

## 2020-06-25 ENCOUNTER — Other Ambulatory Visit: Payer: Self-pay

## 2020-06-25 ENCOUNTER — Other Ambulatory Visit
Admission: RE | Admit: 2020-06-25 | Discharge: 2020-06-25 | Disposition: A | Discharge status: Home / Self Care | Payer: 59 | Source: Home / Self Care | Attending: Psychiatry | Admitting: Psychiatry

## 2020-06-25 ENCOUNTER — Ambulatory Visit
Admission: RE | Admit: 2020-06-25 | Discharge: 2020-06-25 | Disposition: A | Discharge status: Home / Self Care | Payer: 59 | Source: Ambulatory Visit | Attending: Neurology | Admitting: Neurology

## 2020-06-25 DIAGNOSIS — G379 Demyelinating disease of central nervous system, unspecified: Secondary | ICD-10-CM | POA: Insufficient documentation

## 2020-06-26 LAB — PROLACTIN: Prolactin: 14 ng/mL (ref 4.8–23.3)

## 2020-06-26 LAB — HEMOGLOBIN A1C
Hgb A1c MFr Bld: 5.1 % (ref 4.8–5.6)
Mean Plasma Glucose: 100 mg/dL

## 2020-06-30 ENCOUNTER — Telehealth (INDEPENDENT_AMBULATORY_CARE_PROVIDER_SITE_OTHER): Payer: 59 | Admitting: Psychiatry

## 2020-06-30 ENCOUNTER — Other Ambulatory Visit: Payer: Self-pay

## 2020-06-30 DIAGNOSIS — Z5329 Procedure and treatment not carried out because of patient's decision for other reasons: Secondary | ICD-10-CM

## 2020-06-30 NOTE — Progress Notes (Signed)
No response to call or text or video invite  

## 2020-07-08 ENCOUNTER — Telehealth: Payer: Self-pay | Admitting: Psychiatry

## 2020-07-08 DIAGNOSIS — G2111 Neuroleptic induced parkinsonism: Secondary | ICD-10-CM

## 2020-07-08 DIAGNOSIS — T43505A Adverse effect of unspecified antipsychotics and neuroleptics, initial encounter: Secondary | ICD-10-CM

## 2020-07-08 DIAGNOSIS — F410 Panic disorder [episodic paroxysmal anxiety] without agoraphobia: Secondary | ICD-10-CM

## 2020-07-08 DIAGNOSIS — F250 Schizoaffective disorder, bipolar type: Secondary | ICD-10-CM

## 2020-07-08 DIAGNOSIS — F5105 Insomnia due to other mental disorder: Secondary | ICD-10-CM

## 2020-07-08 MED ORDER — PRAZOSIN HCL 1 MG PO CAPS: 1.0000 mg | ORAL_CAPSULE | Freq: Every day | ORAL | 0 refills | Status: DC

## 2020-07-08 MED ORDER — ESZOPICLONE 3 MG PO TABS: 3.0000 mg | ORAL_TABLET | Freq: Every day | ORAL | 1 refills | Status: DC

## 2020-07-08 MED ORDER — ARIPIPRAZOLE 20 MG PO TABS: 20.0000 mg | ORAL_TABLET | Freq: Every day | ORAL | 0 refills | Status: DC

## 2020-07-08 NOTE — Telephone Encounter (Signed)
Patient noncompliant with follow-up appointment.  Patient provided refills. Patient provided community resources.

## 2020-07-16 ENCOUNTER — Telehealth: Payer: Self-pay

## 2020-07-16 NOTE — Telephone Encounter (Signed)
pt called states she just got her dismissal letter.  she very upset. she stated she has not missed any back to back appt and that she missed appt because her daughter has been very sick and she having to take her to differrent appts. she states she feels like she was wrongly dismissed.  She states she has a lot on her and she done everything you told her to do.   She was told that you was not in the office and that a message would be sent to her for when she got back.

## 2020-07-27 NOTE — Telephone Encounter (Signed)
Patient NO showed her appointment and did not contact us about daughter being sick prior to reschedule or cancel.

## 2020-08-02 ENCOUNTER — Other Ambulatory Visit: Payer: Self-pay | Admitting: Nurse Practitioner

## 2020-08-02 DIAGNOSIS — J300 Vasomotor rhinitis: Secondary | ICD-10-CM

## 2020-08-21 ENCOUNTER — Telehealth: Payer: Self-pay

## 2020-08-21 DIAGNOSIS — F410 Panic disorder [episodic paroxysmal anxiety] without agoraphobia: Secondary | ICD-10-CM

## 2020-08-21 DIAGNOSIS — F250 Schizoaffective disorder, bipolar type: Secondary | ICD-10-CM

## 2020-08-21 MED ORDER — BUPROPION HCL ER (XL) 150 MG PO TB24: ORAL_TABLET | ORAL | 0 refills | Status: DC

## 2020-08-21 MED ORDER — LAMOTRIGINE 200 MG PO TABS
100.0000 mg | ORAL_TABLET | Freq: Two times a day (BID) | ORAL | 0 refills | Status: DC
Start: 2020-08-21 — End: 2021-03-11

## 2020-08-21 NOTE — Telephone Encounter (Signed)
pt called left a message wanted to know if you was going to give her enough medication refill until she can find another psychiatrist

## 2020-08-21 NOTE — Telephone Encounter (Signed)
I sent Abilify, prazosin, 90-day supply on 07/08/2020.  I sent Lunesta on 07/08/2020-57-month supply with 1 refill  I have sent Lamictal and Wellbutrin to pharmacy right now for 90-day supply.  Will not be able to give further refills, she will have to contact her primary care provider or establish care with a psychiatrist.  We will have Shanda Bumps CMA contact patient to discuss.

## 2020-08-25 DIAGNOSIS — F410 Panic disorder [episodic paroxysmal anxiety] without agoraphobia: Secondary | ICD-10-CM | POA: Diagnosis not present

## 2020-08-25 DIAGNOSIS — F250 Schizoaffective disorder, bipolar type: Secondary | ICD-10-CM | POA: Diagnosis not present

## 2020-08-25 DIAGNOSIS — F4312 Post-traumatic stress disorder, chronic: Secondary | ICD-10-CM | POA: Diagnosis not present

## 2020-09-09 ENCOUNTER — Other Ambulatory Visit: Payer: Self-pay | Admitting: Psychiatry

## 2020-09-09 DIAGNOSIS — F410 Panic disorder [episodic paroxysmal anxiety] without agoraphobia: Secondary | ICD-10-CM

## 2020-09-16 ENCOUNTER — Telehealth: Payer: Self-pay | Admitting: *Deleted

## 2020-09-16 NOTE — Telephone Encounter (Signed)
Patient called stating that she is having symptoms of her iron being low and she as tried taking oral iron which is not helping. She reports dizziness and that she has been having terrible menstrual cycles and states that her next IV Iron infusion is not until October. She feels she needs to be checked and possible have iron before then. Please advise

## 2020-09-16 NOTE — Telephone Encounter (Signed)
Colette- per Dr. Leonard Schwartz- ok to move up apt - next available with MD or NP for lab/md venofer.

## 2020-09-17 ENCOUNTER — Encounter: Payer: Self-pay | Admitting: Internal Medicine

## 2020-09-17 ENCOUNTER — Telehealth: Payer: Self-pay

## 2020-09-17 DIAGNOSIS — F410 Panic disorder [episodic paroxysmal anxiety] without agoraphobia: Secondary | ICD-10-CM | POA: Diagnosis not present

## 2020-09-17 DIAGNOSIS — F250 Schizoaffective disorder, bipolar type: Secondary | ICD-10-CM | POA: Diagnosis not present

## 2020-09-17 DIAGNOSIS — F4312 Post-traumatic stress disorder, chronic: Secondary | ICD-10-CM | POA: Diagnosis not present

## 2020-09-17 NOTE — Telephone Encounter (Signed)
Pt calling; had UTI; tx'd c [address]; thought it cleared up; has anemia and dizzy spells; head is fuzzy - having trouble thinking of words, issues driving; she just finished cycle; thinks all this is related to her cycle; her hemotologist can't see her until next Thurs.  [phone]

## 2020-09-17 NOTE — Telephone Encounter (Signed)
She needs to go to ED, hx of TIA/stroke in past. Not period related, not significantly anemic on 4/22 labs.

## 2020-09-17 NOTE — Telephone Encounter (Signed)
Left detailed msg as pt identified herself on msg.

## 2020-09-18 ENCOUNTER — Other Ambulatory Visit: Payer: Self-pay | Admitting: *Deleted

## 2020-09-18 DIAGNOSIS — E611 Iron deficiency: Secondary | ICD-10-CM

## 2020-09-19 ENCOUNTER — Other Ambulatory Visit: Payer: Self-pay | Admitting: Psychiatry

## 2020-09-19 DIAGNOSIS — F250 Schizoaffective disorder, bipolar type: Secondary | ICD-10-CM

## 2020-09-22 ENCOUNTER — Encounter: Payer: Self-pay | Admitting: Internal Medicine

## 2020-09-25 ENCOUNTER — Inpatient Hospital Stay: Payer: BC Managed Care – PPO

## 2020-09-25 ENCOUNTER — Ambulatory Visit: Payer: BC Managed Care – PPO | Admitting: Nurse Practitioner

## 2020-09-25 ENCOUNTER — Inpatient Hospital Stay: Payer: BC Managed Care – PPO | Admitting: Nurse Practitioner

## 2020-10-02 ENCOUNTER — Ambulatory Visit: Payer: BC Managed Care – PPO | Admitting: Nurse Practitioner

## 2020-10-07 ENCOUNTER — Inpatient Hospital Stay: Payer: BC Managed Care – PPO

## 2020-10-07 ENCOUNTER — Inpatient Hospital Stay: Payer: BC Managed Care – PPO | Admitting: Nurse Practitioner

## 2020-10-17 ENCOUNTER — Ambulatory Visit: Payer: BC Managed Care – PPO | Admitting: Nurse Practitioner

## 2020-10-21 ENCOUNTER — Other Ambulatory Visit: Payer: 59

## 2020-10-21 ENCOUNTER — Ambulatory Visit: Payer: 59

## 2020-10-21 ENCOUNTER — Ambulatory Visit: Payer: 59 | Admitting: Nurse Practitioner

## 2020-11-03 DIAGNOSIS — F4312 Post-traumatic stress disorder, chronic: Secondary | ICD-10-CM | POA: Diagnosis not present

## 2020-11-03 DIAGNOSIS — F410 Panic disorder [episodic paroxysmal anxiety] without agoraphobia: Secondary | ICD-10-CM | POA: Diagnosis not present

## 2020-11-03 DIAGNOSIS — F3132 Bipolar disorder, current episode depressed, moderate: Secondary | ICD-10-CM | POA: Diagnosis not present

## 2020-11-05 ENCOUNTER — Encounter: Payer: Self-pay | Admitting: Nurse Practitioner

## 2020-11-05 ENCOUNTER — Ambulatory Visit (INDEPENDENT_AMBULATORY_CARE_PROVIDER_SITE_OTHER): Payer: BC Managed Care – PPO | Admitting: Nurse Practitioner

## 2020-11-05 ENCOUNTER — Other Ambulatory Visit: Payer: Self-pay

## 2020-11-05 VITALS — BP 112/72 | HR 72 | Temp 98.5°F | Ht 67.0 in | Wt 236.3 lb

## 2020-11-05 DIAGNOSIS — Z7689 Persons encountering health services in other specified circumstances: Secondary | ICD-10-CM | POA: Diagnosis not present

## 2020-11-05 DIAGNOSIS — R5383 Other fatigue: Secondary | ICD-10-CM

## 2020-11-05 DIAGNOSIS — F3177 Bipolar disorder, in partial remission, most recent episode mixed: Secondary | ICD-10-CM

## 2020-11-05 DIAGNOSIS — Z6837 Body mass index (BMI) 37.0-37.9, adult: Secondary | ICD-10-CM

## 2020-11-05 DIAGNOSIS — R319 Hematuria, unspecified: Secondary | ICD-10-CM | POA: Insufficient documentation

## 2020-11-05 DIAGNOSIS — R110 Nausea: Secondary | ICD-10-CM | POA: Diagnosis not present

## 2020-11-05 LAB — POCT URINALYSIS DIP (CLINITEK)
Bilirubin, UA: NEGATIVE
Glucose, UA: NEGATIVE mg/dL
Ketones, POC UA: NEGATIVE mg/dL
Leukocytes, UA: NEGATIVE
Nitrite, UA: NEGATIVE
POC PROTEIN,UA: NEGATIVE
Spec Grav, UA: >=1.03 — AB (ref 1.010–1.025)
Urobilinogen, UA: 0.2 E.U./dL
pH, UA: 5.5 (ref 5.0–8.0)

## 2020-11-05 NOTE — Patient Instructions (Signed)

## 2020-11-05 NOTE — Progress Notes (Signed)
New Patient Office Visit  Subjective:  Patient ID: Brittney Duran, female    DOB: 11/05/1970  Age: 50 y.o. MRN: [id number]  CC:  Chief Complaint  Patient presents with   New Patient (Initial Visit)    HPI Brittney Duran presents to establish new primary care provider. She states that she has been suffering from some UTI type symptoms.she was having nausea with cloudy urine and strong urine to the urine. She also reports some dizziness. She states that she has been taking OTC cranberry tablets. She states that symptoms have improved. She is having minor dizzy spells she states that she does go a long time between eating. This may be related to current medication. Medications have not changed in quite some time .she does see neurologist due to memory issues. MRI was doe earlier this year and was negative for any acute abnormalities or problems.  The patient does see psychiatry. She states that she recently missed an appointment and has been dropped from  the practice. The patient does see therapist as well. The therapist has recommended she see a particular psychiatrist in that practice. Will provide [place] with information. I will provide referral if one is necessary. The patient does see GYN provider. Up to date with CPE and pap smear.  She denies other concerns or complaints today.  She denies chest pain, chest pressure, or shortness of breath. She denies headaches or visual disturbances. She denies abdominal pain, nausea, vomiting, or changes in bowel or bladder habits.    Past Medical History:  Diagnosis Date   Anemia    Anxiety    Panic attacks   Complication of anesthesia    pt reports "local" wears off quickly   Depression    Environmental allergies    Family history of breast cancer    Family history of pancreatic cancer    Family history of prostate cancer    Family history of stomach cancer    Hemorrhoids    Leaky heart valve    Sciatica 09/08/2017   Stroke [facility])    Wears  contact lenses     Past Surgical History:  Procedure Laterality Date   Caesaran section  1989   COLONOSCOPY WITH PROPOFOL N/A 10/07/2014   Procedure: COLONOSCOPY WITH PROPOFOL;  Surgeon: [name], MD;  Location: [facility];  Service: Endoscopy;  Laterality: N/A;  Latex   HERNIA REPAIR  1978    Family History  Problem Relation Age of Onset   Hypertension Mother    Seizures Brother    Stomach cancer Maternal Aunt 63   Breast cancer Paternal Aunt        dx 64s   Pancreatic cancer Paternal Aunt 73   Prostate cancer Paternal Grandfather        dx 47s   Kidney failure Brother    Pancreatic cancer Other    Brain cancer Maternal Great-grandmother    Breast cancer Cousin        dx 2s    Social History   Socioeconomic History   Marital status: Married    Spouse name: duglaus Alper   Number of children: 5   Years of education: 16   Highest education level: Some college, no degree  Occupational History   Occupation: [employer]  Tobacco Use   Smoking status: Former    Years: 15.00    Types: Cigarettes    Quit date: 1997    Years since quitting: 25.8   Smokeless tobacco: Never  Tobacco comments:    stopped at age 56 yro  Vaping Use   Vaping Use: Never used  Substance and Sexual Activity   Alcohol use: Yes    Alcohol/week: 0.0 standard drinks    Comment: drinks red wine 2-3 times a month   Drug use: No   Sexual activity: Yes    Birth control/protection: None    Comment: Paragard  Other Topics Concern   Not on file  Social History Narrative   Lives at home w/ her husband and children; Left-handed; Drinks 1 cup of coffee per day. Lives in [place]; used to work for [employer]/ awaiting for disability [sec to stroke]; no smoking; rare alcohol/wine.    Social Determinants of Health   Financial Resource Strain: Not on file  Food Insecurity: Not on file  Transportation Needs: Not on file  Physical Activity: Not on file  Stress: Not on file  Social Connections:  Not on file  Intimate Partner Violence: Not on file    ROS Review of Systems  Constitutional:  Positive for fatigue. Negative for activity change, appetite change, chills and fever.  HENT:  Negative for congestion, postnasal drip, rhinorrhea, sinus pressure, sinus pain, sneezing and sore throat.   Eyes: Negative.   Respiratory:  Negative for cough, chest tightness, shortness of breath and wheezing.   Cardiovascular:  Negative for chest pain and palpitations.  Gastrointestinal:  Positive for nausea. Negative for abdominal pain, constipation, diarrhea and vomiting.  Endocrine: Negative for cold intolerance, heat intolerance, polydipsia and polyuria.  Genitourinary:  Positive for dysuria, frequency and urgency. Negative for dyspareunia and flank pain.  Musculoskeletal:  Negative for arthralgias, back pain and myalgias.  Skin:  Negative for rash.  Allergic/Immunologic: Negative for environmental allergies.  Neurological:  Positive for dizziness. Negative for weakness and headaches.  Hematological:  Negative for adenopathy.       Patient sees psychiatry on routine basis. Recently received certified letter discharging her from practice.   Psychiatric/Behavioral:  Positive for dysphoric mood. The patient is nervous/anxious.    Objective:   Today's Vitals   11/05/20 1031  BP: 112/72  Pulse: 72  Temp: 98.5 F (36.9 C)  SpO2: 97%  Weight: 236 lb 4.8 oz (107.2 kg)  Height: 5\' 7"  (1.702 m)   Body mass index is 37.01 kg/m.   Physical Exam Vitals and nursing note reviewed.  Constitutional:      Appearance: Normal appearance. She is well-developed. She is obese.  HENT:     Head: Normocephalic and atraumatic.     Nose: Nose normal.  Eyes:     Extraocular Movements: Extraocular movements intact.     Pupils: Pupils are equal, round, and reactive to light.  Cardiovascular:     Rate and Rhythm: Normal rate and regular rhythm.     Pulses: Normal pulses.     Heart sounds: Normal heart  sounds.  Pulmonary:     Effort: Pulmonary effort is normal.     Breath sounds: Normal breath sounds.  Abdominal:     Palpations: Abdomen is soft.  Musculoskeletal:        General: Normal range of motion.     Cervical back: Normal range of motion and neck supple.  Lymphadenopathy:     Cervical: No cervical adenopathy.  Skin:    General: Skin is warm and dry.     Capillary Refill: Capillary refill takes less than 2 seconds.  Neurological:     General: No focal deficit present.     Mental  Status: She is alert and oriented to person, place, and time.  Psychiatric:        Mood and Affect: Mood normal.        Behavior: Behavior normal.        Thought Content: Thought content normal.        Judgment: Judgment normal.    Assessment & Plan:  1. Encounter to establish care Appointment today to establish new primary care provider.  2. Other fatigue Will check thyroid panel.  Reviewed recent iron studies which were normal.  BMP and hemoglobin A1c also normal.  Fatigue may be due to medications versus "forgetting" to eat.  We will discuss results with patient when they are available. - T4, free - TSH  3. Nausea Urinalysis done today to evaluate for urinary tract infection.  There is small amount of blood in the urine with out white blood cells or other evidence of infection.  We will send for culture and sensitivity and treat as indicated. - POCT URINALYSIS DIP (CLINITEK)  4. Hematuria, unspecified type Urine sample showing mild hematuria.  We will send urine sample for culture and sensitivity and treat with antibiotics as indicated. - Urine Culture  5. Body mass index (BMI) of 37.0-37.9 in adult Recommend patient limit her calorie intake to 1500 cal/day.  She should incorporate exercise into her daily routine.  Check thyroid panel today.  Consider additional medication to help with weight loss in the future.  6. Bipolar disorder, in partial remission, most recent episode mixed  [facility]) Patient reports recently being discharged from recent psychiatric practice.  Does see mental health counselor.  Counselor has a Therapist, sports and same practice she would like patient to see.  I understand a referral will be made.  We will provide additional referral information if needed.  Problem List Items Addressed This Visit       Other   Bipolar disorder, in partial remission, most recent episode mixed (HCC)   Other fatigue   Relevant Orders   T4, free   TSH   Body mass index (BMI) of 37.0-37.9 in adult   Nausea   Relevant Orders   POCT URINALYSIS DIP (CLINITEK) (Completed)   Hematuria   Relevant Orders   Urine Culture   Other Visit Diagnoses     Encounter to establish care    -  Primary       Outpatient Encounter Medications as of 11/05/2020  Medication Sig   ARIPiprazole (ABILIFY) 20 MG tablet Take 1 tablet (20 mg total) by mouth daily.   aspirin 81 MG tablet Take 162 mg by mouth daily.   buPROPion (WELLBUTRIN XL) 150 MG 24 hr tablet Take 1 tablet by mouth daily   cetirizine-pseudoephedrine (ZYRTEC-D) 5-120 MG per tablet Take 1 tablet by mouth 2 (two) times daily as needed for allergies.   Cholecalciferol (VITAMIN D3) 5000 units CAPS Take 1 capsule by mouth daily.   Eszopiclone 3 MG TABS Take 1 tablet (3 mg total) by mouth at bedtime. Take immediately before bedtime   ferrous sulfate 325 (65 FE) MG tablet Take 325 mg by mouth daily with breakfast.   fluticasone (FLONASE) 50 MCG/ACT nasal spray Place 2 sprays into both nostrils daily.   hydrOXYzine (VISTARIL) 25 MG capsule Take 1 capsule (25 mg total) by mouth 2 (two) times daily as needed. For anxiety attacks   ibuprofen (ADVIL,MOTRIN) 800 MG tablet Take 1 tablet (800 mg total) by mouth every 8 (eight) hours as needed for moderate pain.  lamoTRIgine (LAMICTAL) 200 MG tablet Take 0.5 tablets (100 mg total) by mouth 2 (two) times daily.   prazosin (MINIPRESS) 1 MG capsule Take 1 capsule (1 mg total) by mouth at  bedtime. Night mares   triamcinolone (KENALOG) 0.025 % cream Apply topically. (Patient not taking: Reported on 11/05/2020)   vitamin B-12 (CYANOCOBALAMIN) 1000 MCG tablet Take 1,000 mcg by mouth daily as needed (energy). Reported on 05/08/2015   zinc gluconate 50 MG tablet Take 50 mg by mouth daily.   No facility-administered encounter medications on file as of 11/05/2020.    Follow-up: Return in about 4 months (around 03/08/2021) for routine follow up. Marland Kitchen   Carlean Jews, NP  This note was dictated using Conservation officer, historic buildings. Rapid proofreading was performed to expedite the delivery of the information. Despite proofreading, phonetic errors will occur which are common with this voice recognition software. Please take this into consideration. If there are any concerns, please contact our office.

## 2020-11-06 LAB — TSH: TSH: 2.77 u[IU]/mL (ref 0.450–4.500)

## 2020-11-06 LAB — T4, FREE: Free T4: 1.08 ng/dL (ref 0.82–1.77)

## 2020-11-06 NOTE — Progress Notes (Signed)
Thyroid panel looks good. Waiting on urine culture results.

## 2020-11-07 ENCOUNTER — Encounter: Payer: Self-pay | Admitting: Nurse Practitioner

## 2020-11-07 NOTE — Progress Notes (Signed)
MyChart message sent to patient about normal thyroid panel

## 2020-11-10 ENCOUNTER — Other Ambulatory Visit: Payer: Self-pay | Admitting: Nurse Practitioner

## 2020-11-10 ENCOUNTER — Encounter: Payer: Self-pay | Admitting: Nurse Practitioner

## 2020-11-10 DIAGNOSIS — R319 Hematuria, unspecified: Secondary | ICD-10-CM

## 2020-11-10 DIAGNOSIS — N390 Urinary tract infection, site not specified: Secondary | ICD-10-CM

## 2020-11-10 LAB — URINE CULTURE

## 2020-11-10 MED ORDER — SULFAMETHOXAZOLE-TRIMETHOPRIM 800-160 MG PO TABS: 1.0000 | ORAL_TABLET | Freq: Two times a day (BID) | ORAL | 0 refills | Status: DC

## 2020-11-10 NOTE — Progress Notes (Signed)
Sent prescription for bactrim DS twice daily for 7 days to the patient pharmacy. MyChart message sent to patient.

## 2020-11-17 ENCOUNTER — Encounter: Payer: Self-pay | Admitting: Nurse Practitioner

## 2020-11-20 DIAGNOSIS — F250 Schizoaffective disorder, bipolar type: Secondary | ICD-10-CM | POA: Diagnosis not present

## 2020-11-20 DIAGNOSIS — F410 Panic disorder [episodic paroxysmal anxiety] without agoraphobia: Secondary | ICD-10-CM | POA: Diagnosis not present

## 2020-11-20 DIAGNOSIS — F4312 Post-traumatic stress disorder, chronic: Secondary | ICD-10-CM | POA: Diagnosis not present

## 2020-12-03 ENCOUNTER — Ambulatory Visit (INDEPENDENT_AMBULATORY_CARE_PROVIDER_SITE_OTHER): Payer: BC Managed Care – PPO | Admitting: Nurse Practitioner

## 2020-12-03 ENCOUNTER — Other Ambulatory Visit: Payer: Self-pay

## 2020-12-03 ENCOUNTER — Encounter: Payer: Self-pay | Admitting: Nurse Practitioner

## 2020-12-03 ENCOUNTER — Encounter: Payer: Self-pay | Admitting: Internal Medicine

## 2020-12-03 VITALS — BP 109/71 | HR 71 | Temp 97.1°F | Ht 67.0 in | Wt 237.2 lb

## 2020-12-03 DIAGNOSIS — R5383 Other fatigue: Secondary | ICD-10-CM | POA: Diagnosis not present

## 2020-12-03 DIAGNOSIS — N393 Stress incontinence (female) (male): Secondary | ICD-10-CM | POA: Diagnosis not present

## 2020-12-03 DIAGNOSIS — Z6837 Body mass index (BMI) 37.0-37.9, adult: Secondary | ICD-10-CM | POA: Diagnosis not present

## 2020-12-03 DIAGNOSIS — F3178 Bipolar disorder, in full remission, most recent episode mixed: Secondary | ICD-10-CM | POA: Diagnosis not present

## 2020-12-03 MED ORDER — SAXENDA 18 MG/3ML ~~LOC~~ SOPN: PEN_INJECTOR | SUBCUTANEOUS | 1 refills | Status: DC

## 2020-12-03 NOTE — Progress Notes (Signed)
Established Patient Office Visit  Subjective:  Patient ID: Brittney Duran, female    DOB: [birth year]  Age: 50 y.o. MRN: [id number]  CC:  Chief Complaint  Patient presents with   Weight Loss     HPI Brittney Duran presents for weight management. She states that she has steadily been gaining weight due to combination of medications she is taking due to mental health. She has gained two pounds since 04/2020 and is having a difficult time taking the weight off. She is not a good candidate for stimulant appetite suppressants due to severe anxiety and mental health history. She is consuming a 1500 calorie diet which is low in cholesterol and fat. She does participate in low impact exercise on routine basis. She states that despite these measures, she has not lost weight.  She is also reporting increased urinary frequency and urgency. She states that anytime she strains, laughs, coughs, or sneezes, she is noticing leakage of urine. She states that over time, this has become worse and more noticeable.   Past Medical History:  Diagnosis Date   Anemia    Anxiety    Panic attacks   Complication of anesthesia    pt reports "local" wears off quickly   Depression    Environmental allergies    Family history of breast cancer    Family history of pancreatic cancer    Family history of prostate cancer    Family history of stomach cancer    Hemorrhoids    Leaky heart valve    Sciatica 09/08/2017   Stroke [facility])    Wears contact lenses     Past Surgical History:  Procedure Laterality Date   Caesaran section  1989   COLONOSCOPY WITH PROPOFOL N/A 10/07/2014   Procedure: COLONOSCOPY WITH PROPOFOL;  Surgeon: [name], MD;  Location: [facility];  Service: Endoscopy;  Laterality: N/A;  Latex   HERNIA REPAIR  1978    Family History  Problem Relation Age of Onset   Hypertension Mother    Seizures Brother    Stomach cancer Maternal Aunt 49   Breast cancer Paternal Aunt        dx 90s    Pancreatic cancer Paternal Aunt 43   Prostate cancer Paternal Grandfather        dx 60s   Kidney failure Brother    Pancreatic cancer Other    Brain cancer Maternal Great-grandmother    Breast cancer Cousin        dx 77s    Social History   Socioeconomic History   Marital status: Married    Spouse name: duglaus Ratchford   Number of children: 5   Years of education: 16   Highest education level: Some college, no degree  Occupational History   Occupation: [employer]  Tobacco Use   Smoking status: Former    Years: 15.00    Types: Cigarettes    Quit date: 1997    Years since quitting: 25.8   Smokeless tobacco: Never   Tobacco comments:    stopped at age 50 yro  Vaping Use   Vaping Use: Never used  Substance and Sexual Activity   Alcohol use: Yes    Alcohol/week: 0.0 standard drinks    Comment: drinks red wine 2-3 times a month   Drug use: No   Sexual activity: Yes    Birth control/protection: None    Comment: Paragard  Other Topics Concern   Not on file  Social History Narrative  Lives at home w/ her husband and children; Left-handed; Drinks 1 cup of coffee per day. Lives in [place]; used to work for [employer]/ awaiting for disability [sec to stroke]; no smoking; rare alcohol/wine.    Social Determinants of Health   Financial Resource Strain: Not on file  Food Insecurity: Not on file  Transportation Needs: Not on file  Physical Activity: Not on file  Stress: Not on file  Social Connections: Not on file  Intimate Partner Violence: Not on file    Outpatient Medications Prior to Visit  Medication Sig Dispense Refill   ARIPiprazole (ABILIFY) 20 MG tablet Take 1 tablet (20 mg total) by mouth daily. 90 tablet 0   aspirin 81 MG tablet Take 162 mg by mouth daily.     buPROPion (WELLBUTRIN XL) 150 MG 24 hr tablet Take 1 tablet by mouth daily 90 tablet 0   cetirizine-pseudoephedrine (ZYRTEC-D) 5-120 MG per tablet Take 1 tablet by mouth 2 (two) times daily as needed for  allergies.     Cholecalciferol (VITAMIN D3) 5000 units CAPS Take 1 capsule by mouth daily.     Eszopiclone 3 MG TABS Take 1 tablet (3 mg total) by mouth at bedtime. Take immediately before bedtime 30 tablet 1   ferrous sulfate 325 (65 FE) MG tablet Take 325 mg by mouth daily with breakfast.     fluticasone (FLONASE) 50 MCG/ACT nasal spray Place 2 sprays into both nostrils daily. 16 g 6   hydrOXYzine (VISTARIL) 25 MG capsule Take 1 capsule (25 mg total) by mouth 2 (two) times daily as needed. For anxiety attacks 180 capsule 1   ibuprofen (ADVIL,MOTRIN) 800 MG tablet Take 1 tablet (800 mg total) by mouth every 8 (eight) hours as needed for moderate pain. 15 tablet 0   lamoTRIgine (LAMICTAL) 200 MG tablet Take 0.5 tablets (100 mg total) by mouth 2 (two) times daily. 90 tablet 0   prazosin (MINIPRESS) 1 MG capsule Take 1 capsule (1 mg total) by mouth at bedtime. Night mares 90 capsule 0   vitamin B-12 (CYANOCOBALAMIN) 1000 MCG tablet Take 1,000 mcg by mouth daily as needed (energy). Reported on 05/08/2015     zinc gluconate 50 MG tablet Take 50 mg by mouth daily.     sulfamethoxazole-trimethoprim (BACTRIM DS) 800-160 MG tablet Take 1 tablet by mouth 2 (two) times daily. 14 tablet 0   triamcinolone (KENALOG) 0.025 % cream Apply topically.     No facility-administered medications prior to visit.    Allergies  Allergen Reactions   Lithium Swelling    Face swelling  Face swelling   Other Swelling    lips   Penicillins Other (See Comments)    Unknown reaction Has patient had a PCN reaction causing immediate rash, facial/tongue/throat swelling, SOB or lightheadedness with hypotension: YES Has patient had a PCN reaction causing severe rash involving mucus membranes or skin necrosis: NO Has patient had a PCN reaction that required hospitalizationNO Has patient had a PCN reaction occurring within the last 10 years: NO If all of the above answers are "NO", then may proceed with Cephalosporin use.    Shellfish Allergy Swelling    lips   Latex Rash    ROS Review of Systems  Constitutional:  Positive for fatigue. Negative for activity change, appetite change, chills and fever.       Weight gain and difficulty losing weight despite positive changes in diet and lifestyle.   HENT:  Negative for congestion, postnasal drip, rhinorrhea, sinus pressure, sinus pain,  sneezing and sore throat.   Eyes: Negative.   Respiratory:  Negative for cough, chest tightness, shortness of breath and wheezing.   Cardiovascular:  Negative for chest pain and palpitations.  Gastrointestinal:  Negative for abdominal pain, constipation, diarrhea, nausea and vomiting.  Endocrine: Negative for cold intolerance, heat intolerance, polydipsia and polyuria.  Genitourinary:  Positive for frequency and urgency. Negative for dyspareunia, dysuria and flank pain.  Musculoskeletal:  Negative for arthralgias, back pain and myalgias.  Skin:  Negative for rash.  Allergic/Immunologic: Negative for environmental allergies.  Neurological:  Negative for dizziness, weakness and headaches.  Hematological:  Negative for adenopathy.  Psychiatric/Behavioral:  Positive for dysphoric mood. The patient is nervous/anxious.      Objective:    Physical Exam Vitals and nursing note reviewed.  Constitutional:      Appearance: Normal appearance. She is well-developed. She is obese.  HENT:     Head: Normocephalic and atraumatic.     Nose: Nose normal.     Mouth/Throat:     Mouth: Mucous membranes are moist.  Eyes:     Extraocular Movements: Extraocular movements intact.     Conjunctiva/sclera: Conjunctivae normal.     Pupils: Pupils are equal, round, and reactive to light.  Cardiovascular:     Rate and Rhythm: Normal rate and regular rhythm.     Pulses: Normal pulses.     Heart sounds: Normal heart sounds.  Pulmonary:     Effort: Pulmonary effort is normal.     Breath sounds: Normal breath sounds.  Abdominal:     Palpations:  Abdomen is soft.  Musculoskeletal:        General: Normal range of motion.     Cervical back: Normal range of motion and neck supple.  Lymphadenopathy:     Cervical: No cervical adenopathy.  Skin:    General: Skin is warm and dry.     Capillary Refill: Capillary refill takes less than 2 seconds.  Neurological:     General: No focal deficit present.     Mental Status: She is alert and oriented to person, place, and time.  Psychiatric:        Mood and Affect: Mood normal.        Behavior: Behavior normal.        Thought Content: Thought content normal.        Judgment: Judgment normal.    Today's Vitals   12/03/20 0925  BP: 109/71  Pulse: 71  Temp: (!) 97.1 F (36.2 C)  SpO2: 99%  Weight: 237 lb 3.2 oz (107.6 kg)  Height: 5\' 7"  (1.702 m)   Body mass index is 37.15 kg/m.   Wt Readings from Last 3 Encounters:  12/03/20 237 lb 3.2 oz (107.6 kg)  11/05/20 236 lb 4.8 oz (107.2 kg)  04/18/20 235 lb 12.8 oz (107 kg)     Health Maintenance Due  Topic Date Due   Pneumococcal Vaccine 61-38 Years old (1 - PCV) Never done    There are no preventive care reminders to display for this patient.  Lab Results  Component Value Date   TSH 2.770 11/05/2020   Lab Results  Component Value Date   WBC 6.4 04/22/2020   HGB 11.6 (L) 04/22/2020   HCT 35.5 (L) 04/22/2020   MCV 88.8 04/22/2020   PLT 207 04/22/2020   Lab Results  Component Value Date   NA 136 04/22/2020   K 4.1 04/22/2020   CO2 24 04/22/2020   GLUCOSE 94 04/22/2020  BUN 11 04/22/2020   CREATININE 0.69 04/22/2020   BILITOT 1.1 08/31/2019   ALKPHOS 41 08/31/2019   AST 16 08/31/2019   ALT 12 08/31/2019   PROT 7.1 08/31/2019   ALBUMIN 4.3 08/31/2019   CALCIUM 9.6 04/22/2020   ANIONGAP 6 04/22/2020   Lab Results  Component Value Date   CHOL 160 08/31/2019   Lab Results  Component Value Date   HDL 42 08/31/2019   Lab Results  Component Value Date   LDLCALC 107 (H) 08/31/2019   Lab Results  Component  Value Date   TRIG 54 08/31/2019   Lab Results  Component Value Date   CHOLHDL 3.8 08/31/2019   Lab Results  Component Value Date   HGBA1C 5.1 06/25/2020      Assessment & Plan:  1. Other fatigue This may be due to medications and recent weight gain. Will monitor.  2. Body mass index (BMI) of 37.0-37.9 in adult Continue a low calorie intake of 1500 calories per day and incorporating exercise into daily routine to help lose weight. Add saxenda daily. Instructed her to titrate dosing of medication from 0.6mg  to 3mg  on weekly basis as tolerated. Demonstrated proper use of medication pen. A sample of medication and discount card were provided to the patient today. A new prescription was sent to the pharmacy today.  - Liraglutide -Weight Management (SAXENDA) 18 MG/3ML SOPN; Inject 0.6 mg [employer] once daily x 1 wk. Then increase dose by 0.6 mg/day every 7 days to target of 3 mg/day.  Dispense: 15 mL; Refill: 1  3. Stress incontinence Reviewed Kegel exercises to strengthen pelvic floor muscles. Will discuss medication therapy for worsening or persistent symptoms at next visit.   4. Bipolar disorder, in full remission, most recent episode mixed [facility]) The patient should continue regular visits with psychiatry as scheduled.    Problem List Items Addressed This Visit       Other   Body mass index (BMI) of 37.0-37.9 in adult - Primary   Relevant Medications   Liraglutide -Weight Management (SAXENDA) 18 MG/3ML SOPN    Meds ordered this encounter  Medications   Liraglutide -Weight Management (SAXENDA) 18 MG/3ML SOPN    Sig: Inject 0.6 mg  once daily x 1 wk. Then increase dose by 0.6 mg/day every 7 days to target of 3 mg/day.    Dispense:  15 mL    Refill:  1    Patient is not a candidate for stimulant medications for weight loss due to generalized anxiety disorder    Order Specific Question:   Supervising Provider    Answer:   10-11-1969 D [[id number]]     Follow-up: Return in 4  weeks (on 12/31/2020) for routine - weight management.    01/02/2021, NP

## 2020-12-03 NOTE — Patient Instructions (Signed)
Calorie Counting for Weight Loss °Calories are units of energy. Your body needs a certain number of calories from food to keep going throughout the day. When you eat or drink more calories than your body needs, your body stores the extra calories mostly as fat. When you eat or drink fewer calories than your body needs, your body burns fat to get the energy it needs. °Calorie counting means keeping track of how many calories you eat and drink each day. Calorie counting can be helpful if you need to lose weight. If you eat fewer calories than your body needs, you should lose weight. Ask your health care provider what a healthy weight is for you. °For calorie counting to work, you will need to eat the right number of calories each day to lose a healthy amount of weight per week. A dietitian can help you figure out how many calories you need in a day and will suggest ways to reach your calorie goal. °A healthy amount of weight to lose each week is usually 1-2 lb (0.5-0.9 kg). This usually means that your daily calorie intake should be reduced by 500-750 calories. °Eating 1,200-1,500 calories a day can help most women lose weight. °Eating 1,500-1,800 calories a day can help most men lose weight. °What do I need to know about calorie counting? °Work with your health care provider or dietitian to determine how many calories you should get each day. To meet your daily calorie goal, you will need to: °Find out how many calories are in each food that you would like to eat. Try to do this before you eat. °Decide how much of the food you plan to eat. °Keep a food log. Do this by writing down what you ate and how many calories it had. °To successfully lose weight, it is important to balance calorie counting with a healthy lifestyle that includes regular activity. °Where do I find calorie information? °The number of calories in a food can be found on a Nutrition Facts label. If a food does not have a Nutrition Facts label, try to  look up the calories online or ask your dietitian for help. °Remember that calories are listed per serving. If you choose to have more than one serving of a food, you will have to multiply the calories per serving by the number of servings you plan to eat. For example, the label on a package of bread might say that a serving size is 1 slice and that there are 90 calories in a serving. If you eat 1 slice, you will have eaten 90 calories. If you eat 2 slices, you will have eaten 180 calories. °How do I keep a food log? °After each time that you eat, record the following in your food log as soon as possible: °What you ate. Be sure to include toppings, sauces, and other extras on the food. °How much you ate. This can be measured in cups, ounces, or number of items. °How many calories were in each food and drink. °The total number of calories in the food you ate. °Keep your food log near you, such as in a pocket-sized notebook or on an app or website on your mobile phone. Some programs will calculate calories for you and show you how many calories you have left to meet your daily goal. °What are some portion-control tips? °Know how many calories are in a serving. This will help you know how many servings you can have of a certain food. °  Use a measuring cup to measure serving sizes. You could also try weighing out portions on a kitchen scale. With time, you will be able to estimate serving sizes for some foods. °Take time to put servings of different foods on your favorite plates or in your favorite bowls and cups so you know what a serving looks like. °Try not to eat straight from a food's packaging, such as from a bag or box. Eating straight from the package makes it hard to see how much you are eating and can lead to overeating. Put the amount you would like to eat in a cup or on a plate to make sure you are eating the right portion. °Use smaller plates, glasses, and bowls for smaller portions and to prevent  overeating. °Try not to multitask. For example, avoid watching TV or using your computer while eating. If it is time to eat, sit down at a table and enjoy your food. This will help you recognize when you are full. It will also help you be more mindful of what and how much you are eating. °What are tips for following this plan? °Reading food labels °Check the calorie count compared with the serving size. The serving size may be smaller than what you are used to eating. °Check the source of the calories. Try to choose foods that are high in protein, fiber, and vitamins, and low in saturated fat, trans fat, and sodium. °Shopping °Read nutrition labels while you shop. This will help you make healthy decisions about which foods to buy. °Pay attention to nutrition labels for low-fat or fat-free foods. These foods sometimes have the same number of calories or more calories than the full-fat versions. They also often have added sugar, starch, or salt to make up for flavor that was removed with the fat. °Make a grocery list of lower-calorie foods and stick to it. °Cooking °Try to cook your favorite foods in a healthier way. For example, try baking instead of frying. °Use low-fat dairy products. °Meal planning °Use more fruits and vegetables. One-half of your plate should be fruits and vegetables. °Include lean proteins, such as chicken, turkey, and fish. °Lifestyle °Each week, aim to do one of the following: °150 minutes of moderate exercise, such as walking. °75 minutes of vigorous exercise, such as running. °General information °Know how many calories are in the foods you eat most often. This will help you calculate calorie counts faster. °Find a way of tracking calories that works for you. Get creative. Try different apps or programs if writing down calories does not work for you. °What foods should I eat? ° °Eat nutritious foods. It is better to have a nutritious, high-calorie food, such as an avocado, than a food with  few nutrients, such as a bag of potato chips. °Use your calories on foods and drinks that will fill you up and will not leave you hungry soon after eating. °Examples of foods that fill you up are nuts and nut butters, vegetables, lean proteins, and high-fiber foods such as whole grains. High-fiber foods are foods with more than 5 g of fiber per serving. °Pay attention to calories in drinks. Low-calorie drinks include water and unsweetened drinks. °The items listed above may not be a complete list of foods and beverages you can eat. Contact a dietitian for more information. °What foods should I limit? °Limit foods or drinks that are not good sources of vitamins, minerals, or protein or that are high in unhealthy fats. These include: °  Candy. °Other sweets. °Sodas, specialty coffee drinks, alcohol, and juice. °The items listed above may not be a complete list of foods and beverages you should avoid. Contact a dietitian for more information. °How do I count calories when eating out? °Pay attention to portions. Often, portions are much larger when eating out. Try these tips to keep portions smaller: °Consider sharing a meal instead of getting your own. °If you get your own meal, eat only half of it. Before you start eating, ask for a container and put half of your meal into it. °When available, consider ordering smaller portions from the menu instead of full portions. °Pay attention to your food and drink choices. Knowing the way food is cooked and what is included with the meal can help you eat fewer calories. °If calories are listed on the menu, choose the lower-calorie options. °Choose dishes that include vegetables, fruits, whole grains, low-fat dairy products, and lean proteins. °Choose items that are boiled, broiled, grilled, or steamed. Avoid items that are buttered, battered, fried, or served with cream sauce. Items labeled as crispy are usually fried, unless stated otherwise. °Choose water, low-fat milk,  unsweetened iced tea, or other drinks without added sugar. If you want an alcoholic beverage, choose a lower-calorie option, such as a glass of wine or light beer. °Ask for dressings, sauces, and syrups on the side. These are usually high in calories, so you should limit the amount you eat. °If you want a salad, choose a garden salad and ask for grilled meats. Avoid extra toppings such as bacon, cheese, or fried items. Ask for the dressing on the side, or ask for olive oil and vinegar or lemon to use as dressing. °Estimate how many servings of a food you are given. Knowing serving sizes will help you be aware of how much food you are eating at restaurants. °Where to find more information °Centers for Disease Control and Prevention: www.cdc.gov °[place] Department of Agriculture: myplate.gov °Summary °Calorie counting means keeping track of how many calories you eat and drink each day. If you eat fewer calories than your body needs, you should lose weight. °A healthy amount of weight to lose per week is usually 1-2 lb (0.5-0.9 kg). This usually means reducing your daily calorie intake by 500-750 calories. °The number of calories in a food can be found on a Nutrition Facts label. If a food does not have a Nutrition Facts label, try to look up the calories online or ask your dietitian for help. °Use smaller plates, glasses, and bowls for smaller portions and to prevent overeating. °Use your calories on foods and drinks that will fill you up and not leave you hungry shortly after a meal. °This information is not intended to replace advice given to you by your health care provider. Make sure you discuss any questions you have with your health care provider. °Document Revised: 02/15/2019 Document Reviewed: 02/15/2019 °Elsevier Patient Education © 2022 [employer] ° °Fat and Cholesterol Restricted Eating Plan °Getting too much fat and cholesterol in your diet may cause health problems. Choosing the right foods helps keep  your fat and cholesterol at normal levels. This can keep you from getting certain diseases. °Your doctor may recommend an eating plan that includes: °Total fat: ______% or less of total calories a day. This is ______g of fat a day. °Saturated fat: ______% or less of total calories a day. This is ______g of saturated fat a day. °Cholesterol: less than _________mg a day. °Fiber: ______g a   day. °What are tips for following this plan? °General tips °Work with your doctor to lose weight if you need to. °Avoid: °Foods with added sugar. °Fried foods. °Foods with trans fat or partially hydrogenated oils. This includes some margarines and baked goods. °If you drink alcohol: °Limit how much you have to: °0-1 drink a day for women who are not pregnant. °0-2 drinks a day for men. °Know how much alcohol is in a drink. In the [place] one drink equals one 12 oz bottle of beer (355 mL), one 5 oz glass of wine (148 mL), or one 1½ oz glass of hard liquor (44 mL). °Reading food labels °Check food labels for: °Trans fats. °Partially hydrogenated oils. °Saturated fat (g) in each serving. °Cholesterol (mg) in each serving. °Fiber (g) in each serving. °Choose foods with healthy fats, such as: °Monounsaturated fats and polyunsaturated fats. These include olive and canola oil, flaxseeds, walnuts, almonds, and seeds. °Omega-3 fats. These are found in certain fish, flaxseed oil, and ground flaxseeds. °Choose grain products that have whole grains. Look for the word "whole" as the first word in the ingredient list. °Cooking °Cook foods using low-fat methods. These include baking, boiling, grilling, and broiling. °Eat more home-cooked foods. Eat at restaurants and buffets less often. Eat less fast food. °Avoid cooking using saturated fats, such as butter, cream, palm oil, palm kernel oil, and coconut oil. °Meal planning ° °At meals, divide your plate into four equal parts: °Fill one-half of your plate with vegetables, green salads, and  fruit. °Fill one-fourth of your plate with whole grains. °Fill one-fourth of your plate with low-fat (lean) protein foods. °Eat fish that is high in omega-3 fats at least two times a week. This includes mackerel, tuna, sardines, and salmon. °Eat foods that are high in fiber, such as whole grains, beans, apples, pears, berries, broccoli, carrots, peas, and barley. °What foods should I eat? °Fruits °All fresh, canned (in natural juice), or frozen fruits. °Vegetables °Fresh or frozen vegetables (raw, steamed, roasted, or grilled). Green salads. °Grains °Whole grains, such as whole wheat or whole grain breads, crackers, cereals, and pasta. Unsweetened oatmeal, bulgur, barley, quinoa, or brown rice. Corn or whole wheat flour tortillas. °Meats and other protein foods °Ground beef (85% or leaner), grass-fed beef, or beef trimmed of fat. Skinless chicken or turkey. Ground chicken or turkey. Pork trimmed of fat. All fish and seafood. Egg whites. Dried beans, peas, or lentils. Unsalted nuts or seeds. Unsalted canned beans. Nut butters without added sugar or oil. °Dairy °Low-fat or nonfat dairy products, such as skim or 1% milk, 2% or reduced-fat cheeses, low-fat and fat-free ricotta or cottage cheese, or plain low-fat and nonfat yogurt. °Fats and oils °Tub margarine without trans fats. Light or reduced-fat mayonnaise and salad dressings. Avocado. Olive, canola, sesame, or safflower oils. °The items listed above may not be a complete list of foods and beverages you can eat. Contact a dietitian for more information. °What foods should I avoid? °Fruits °Canned fruit in heavy syrup. Fruit in cream or butter sauce. Fried fruit. °Vegetables °Vegetables cooked in cheese, cream, or butter sauce. Fried vegetables. °Grains °White bread. White pasta. White rice. Cornbread. Bagels, pastries, and croissants. Crackers and snack foods that contain trans fat and hydrogenated oils. °Meats and other protein foods °Fatty cuts of meat. Ribs,  chicken wings, bacon, sausage, bologna, salami, chitterlings, fatback, hot dogs, bratwurst, and packaged lunch meats. Liver and organ meats. Whole eggs and egg yolks. Chicken and turkey with skin. Fried meat. °  Dairy °Whole or 2% milk, cream, half-and-half, and cream cheese. Whole milk cheeses. Whole-fat or sweetened yogurt. Full-fat cheeses. Nondairy creamers and whipped toppings. Processed cheese, cheese spreads, and cheese curds. °Fats and oils °Butter, stick margarine, lard, shortening, ghee, or bacon fat. Coconut, palm kernel, and palm oils. °Beverages °Alcohol. Sugar-sweetened drinks such as sodas, lemonade, and fruit drinks. °Sweets and desserts °Corn syrup, sugars, honey, and molasses. Candy. Jam and jelly. Syrup. Sweetened cereals. Cookies, pies, cakes, donuts, muffins, and ice cream. °The items listed above may not be a complete list of foods and beverages you should avoid. Contact a dietitian for more information. °Summary °Choosing the right foods helps keep your fat and cholesterol at normal levels. This can keep you from getting certain diseases. °At meals, fill one-half of your plate with vegetables, green salads, and fruits. °Eat high fiber foods, like whole grains, beans, apples, pears, berries, carrots, peas, and barley. °Limit added sugar, saturated fats, alcohol, and fried foods. °This information is not intended to replace advice given to you by your health care provider. Make sure you discuss any questions you have with your health care provider. °Document Revised: 05/16/2020 Document Reviewed: 05/16/2020 °Elsevier Patient Education © 2022 [employer] ° °

## 2020-12-09 ENCOUNTER — Telehealth: Payer: Self-pay

## 2020-12-09 NOTE — Telephone Encounter (Signed)
received notice that refill request for the hydroxyzine.   pt has been dismissed from our clinic .

## 2020-12-09 NOTE — Telephone Encounter (Signed)
faxed request back with a note that patient has been dismissed to follow up with PCP

## 2020-12-25 DIAGNOSIS — F3132 Bipolar disorder, current episode depressed, moderate: Secondary | ICD-10-CM | POA: Diagnosis not present

## 2020-12-25 DIAGNOSIS — F410 Panic disorder [episodic paroxysmal anxiety] without agoraphobia: Secondary | ICD-10-CM | POA: Diagnosis not present

## 2020-12-25 DIAGNOSIS — F4312 Post-traumatic stress disorder, chronic: Secondary | ICD-10-CM | POA: Diagnosis not present

## 2020-12-30 ENCOUNTER — Encounter: Payer: Self-pay | Admitting: Nurse Practitioner

## 2020-12-30 DIAGNOSIS — Z20822 Contact with and (suspected) exposure to covid-19: Secondary | ICD-10-CM | POA: Diagnosis not present

## 2020-12-31 ENCOUNTER — Ambulatory Visit: Payer: BC Managed Care – PPO | Admitting: Nurse Practitioner

## 2020-12-31 ENCOUNTER — Encounter: Payer: Self-pay | Admitting: Nurse Practitioner

## 2021-01-01 ENCOUNTER — Encounter: Payer: Self-pay | Admitting: Nurse Practitioner

## 2021-01-01 ENCOUNTER — Other Ambulatory Visit: Payer: Self-pay

## 2021-01-01 ENCOUNTER — Ambulatory Visit (INDEPENDENT_AMBULATORY_CARE_PROVIDER_SITE_OTHER): Payer: BC Managed Care – PPO | Admitting: Nurse Practitioner

## 2021-01-01 VITALS — Ht 60.4 in | Wt 237.2 lb

## 2021-01-01 DIAGNOSIS — J101 Influenza due to other identified influenza virus with other respiratory manifestations: Secondary | ICD-10-CM

## 2021-01-01 MED ORDER — OSELTAMIVIR PHOSPHATE 75 MG PO CAPS: 75.0000 mg | ORAL_CAPSULE | Freq: Two times a day (BID) | ORAL | 0 refills | Status: DC

## 2021-01-01 NOTE — Progress Notes (Signed)
Virtual Visit via Telephone Note  I connected with Brittney Duran on 01/12/21 at  2:10 PM EST by telephone and verified that I am speaking with the correct person using two identifiers.  Location: Patient: home Provider: [place] [facility]     I discussed the limitations, risks, security and privacy concerns of performing an evaluation and management service by telephone and the availability of in person appointments. I also discussed with the patient that there may be a patient responsible charge related to this service. The patient expressed understanding and agreed to proceed.   History of Present Illness: The patient presents for acute visit. States that she started having nasal congestion, headache, sore throat, and fever. She went to [facility]. She was tested for both COVID 19 and for flu.. she was positive for flu. She has not been treated with Tamiflu at this time.  She has been taking over-the-counter medications to help relieve symptoms.  They do help some but symptoms returned.  She states symptoms have been going on for 3 to 4 days and are gradually worsening.  Her chest her COVID-19 was negative.   Observations/Objective:  The patient is alert and oriented. She is pleasant and answers all questions appropriately. Breathing is non-labored. She is in no acute distress at this time.  She is nasally congested secondary sounding cough.  Today's Vitals   01/01/21 1151  Weight: 237 lb 3.2 oz (107.6 kg)  Height: 5' 0.4" (1.534 m)   Body mass index is 45.71 kg/m.   Assessment and Plan: 1. Influenza A Patient positive for flu during [facility] this weekend.  Start treatment with Tamiflu 75 mg twice daily for next 5 days.Rest and increase fluids. Continue using OTC medication to control symptoms.   - oseltamivir (TAMIFLU) 75 MG capsule; Take 1 capsule (75 mg total) by mouth 2 (two) times daily.  Dispense: 10 capsule; Refill: 0   Follow Up  Instructions:    I discussed the assessment and treatment plan with the patient. The patient was provided an opportunity to ask questions and all were answered. The patient agreed with the plan and demonstrated an understanding of the instructions.   The patient was advised to call back or seek an in-person evaluation if the symptoms worsen or if the condition fails to improve as anticipated.  I provided 10 minutes of non-face-to-face time during this encounter.   Carlean Jews, NP

## 2021-01-07 ENCOUNTER — Other Ambulatory Visit: Payer: Self-pay | Admitting: Nurse Practitioner

## 2021-01-07 ENCOUNTER — Encounter: Payer: Self-pay | Admitting: Nurse Practitioner

## 2021-01-07 DIAGNOSIS — R052 Subacute cough: Secondary | ICD-10-CM

## 2021-01-07 DIAGNOSIS — J101 Influenza due to other identified influenza virus with other respiratory manifestations: Secondary | ICD-10-CM

## 2021-01-07 MED ORDER — METHYLPREDNISOLONE 4 MG PO TBPK: ORAL_TABLET | ORAL | 0 refills | Status: DC

## 2021-01-12 DIAGNOSIS — J101 Influenza due to other identified influenza virus with other respiratory manifestations: Secondary | ICD-10-CM | POA: Insufficient documentation

## 2021-01-20 ENCOUNTER — Ambulatory Visit: Payer: BC Managed Care – PPO | Admitting: Nurse Practitioner

## 2021-01-21 ENCOUNTER — Telehealth: Payer: Self-pay

## 2021-01-21 NOTE — Telephone Encounter (Signed)
Completed medical records for [facility].  Faxed to [phone]

## 2021-02-02 ENCOUNTER — Other Ambulatory Visit: Payer: Self-pay | Admitting: Nurse Practitioner

## 2021-02-02 DIAGNOSIS — Z6837 Body mass index (BMI) 37.0-37.9, adult: Secondary | ICD-10-CM

## 2021-02-03 DIAGNOSIS — F3132 Bipolar disorder, current episode depressed, moderate: Secondary | ICD-10-CM | POA: Diagnosis not present

## 2021-02-03 DIAGNOSIS — F410 Panic disorder [episodic paroxysmal anxiety] without agoraphobia: Secondary | ICD-10-CM | POA: Diagnosis not present

## 2021-02-03 DIAGNOSIS — F250 Schizoaffective disorder, bipolar type: Secondary | ICD-10-CM | POA: Diagnosis not present

## 2021-02-03 DIAGNOSIS — F4312 Post-traumatic stress disorder, chronic: Secondary | ICD-10-CM | POA: Diagnosis not present

## 2021-02-25 ENCOUNTER — Telehealth: Payer: Self-pay | Admitting: Nurse Practitioner

## 2021-02-25 NOTE — Telephone Encounter (Signed)
Patient requesting refill of Wellbutrin, Lunesta, Lamictal. Please advise. [phone]

## 2021-02-25 NOTE — Telephone Encounter (Signed)
She is supposed to be seeing psychiatrist. At her initial visit, she stated she was given information about psychiatric provider who she was to set up care with as she had been dropped by her previous psychiatrist. I don't prescribed these medications for her.

## 2021-02-25 NOTE — Telephone Encounter (Signed)
Called pt LVM for pt to contact the office °

## 2021-03-02 DIAGNOSIS — F4312 Post-traumatic stress disorder, chronic: Secondary | ICD-10-CM | POA: Diagnosis not present

## 2021-03-02 DIAGNOSIS — F3132 Bipolar disorder, current episode depressed, moderate: Secondary | ICD-10-CM | POA: Diagnosis not present

## 2021-03-02 DIAGNOSIS — F410 Panic disorder [episodic paroxysmal anxiety] without agoraphobia: Secondary | ICD-10-CM | POA: Diagnosis not present

## 2021-03-04 DIAGNOSIS — F3132 Bipolar disorder, current episode depressed, moderate: Secondary | ICD-10-CM | POA: Diagnosis not present

## 2021-03-04 DIAGNOSIS — F4312 Post-traumatic stress disorder, chronic: Secondary | ICD-10-CM | POA: Diagnosis not present

## 2021-03-04 DIAGNOSIS — F410 Panic disorder [episodic paroxysmal anxiety] without agoraphobia: Secondary | ICD-10-CM | POA: Diagnosis not present

## 2021-03-11 ENCOUNTER — Ambulatory Visit (INDEPENDENT_AMBULATORY_CARE_PROVIDER_SITE_OTHER): Payer: BC Managed Care – PPO | Admitting: Nurse Practitioner

## 2021-03-11 ENCOUNTER — Encounter: Payer: Self-pay | Admitting: Nurse Practitioner

## 2021-03-11 ENCOUNTER — Other Ambulatory Visit: Payer: Self-pay

## 2021-03-11 VITALS — BP 108/73 | HR 71 | Temp 98.5°F | Ht 64.0 in | Wt 236.2 lb

## 2021-03-11 DIAGNOSIS — Z13228 Encounter for screening for other metabolic disorders: Secondary | ICD-10-CM

## 2021-03-11 DIAGNOSIS — Z1321 Encounter for screening for nutritional disorder: Secondary | ICD-10-CM

## 2021-03-11 DIAGNOSIS — E039 Hypothyroidism, unspecified: Secondary | ICD-10-CM

## 2021-03-11 DIAGNOSIS — Z1329 Encounter for screening for other suspected endocrine disorder: Secondary | ICD-10-CM

## 2021-03-11 DIAGNOSIS — Z0000 Encounter for general adult medical examination without abnormal findings: Secondary | ICD-10-CM

## 2021-03-11 DIAGNOSIS — F410 Panic disorder [episodic paroxysmal anxiety] without agoraphobia: Secondary | ICD-10-CM

## 2021-03-11 DIAGNOSIS — Z130 Encounter for screening for diseases of the blood and blood-forming organs and certain disorders involving the immune mechanism: Secondary | ICD-10-CM

## 2021-03-11 DIAGNOSIS — F250 Schizoaffective disorder, bipolar type: Secondary | ICD-10-CM

## 2021-03-11 DIAGNOSIS — Z0001 Encounter for general adult medical examination with abnormal findings: Secondary | ICD-10-CM | POA: Diagnosis not present

## 2021-03-11 DIAGNOSIS — F5105 Insomnia due to other mental disorder: Secondary | ICD-10-CM

## 2021-03-11 DIAGNOSIS — Z6841 Body Mass Index (BMI) 40.0 and over, adult: Secondary | ICD-10-CM | POA: Diagnosis not present

## 2021-03-11 DIAGNOSIS — G2111 Neuroleptic induced parkinsonism: Secondary | ICD-10-CM

## 2021-03-11 MED ORDER — ARIPIPRAZOLE 20 MG PO TABS: 20.0000 mg | ORAL_TABLET | Freq: Every day | ORAL | 0 refills | Status: DC

## 2021-03-11 MED ORDER — WEGOVY 0.25 MG/0.5ML ~~LOC~~ SOAJ: 0.2500 mg | SUBCUTANEOUS | 1 refills | Status: DC

## 2021-03-11 MED ORDER — PRAZOSIN HCL 1 MG PO CAPS: 1.0000 mg | ORAL_CAPSULE | Freq: Every day | ORAL | 0 refills | Status: DC

## 2021-03-11 MED ORDER — HYDROXYZINE PAMOATE 25 MG PO CAPS: 25.0000 mg | ORAL_CAPSULE | Freq: Two times a day (BID) | ORAL | 0 refills | Status: DC | PRN

## 2021-03-11 MED ORDER — BUPROPION HCL ER (XL) 150 MG PO TB24: ORAL_TABLET | ORAL | 0 refills | Status: DC

## 2021-03-11 MED ORDER — LAMOTRIGINE 200 MG PO TABS: 100.0000 mg | ORAL_TABLET | Freq: Two times a day (BID) | ORAL | 0 refills | Status: AC

## 2021-03-11 MED ORDER — ESZOPICLONE 3 MG PO TABS: 3.0000 mg | ORAL_TABLET | Freq: Every day | ORAL | 1 refills | Status: AC

## 2021-03-11 NOTE — Patient Instructions (Addendum)
Calorie Counting for Weight Loss °Calories are units of energy. Your body needs a certain number of calories from food to keep going throughout the day. When you eat or drink more calories than your body needs, your body stores the extra calories mostly as fat. When you eat or drink fewer calories than your body needs, your body burns fat to get the energy it needs. °Calorie counting means keeping track of how many calories you eat and drink each day. Calorie counting can be helpful if you need to lose weight. If you eat fewer calories than your body needs, you should lose weight. Ask your health care provider what a healthy weight is for you. °For calorie counting to work, you will need to eat the right number of calories each day to lose a healthy amount of weight per week. A dietitian can help you figure out how many calories you need in a day and will suggest ways to reach your calorie goal. °A healthy amount of weight to lose each week is usually 1-2 lb (0.5-0.9 kg). This usually means that your daily calorie intake should be reduced by 500-750 calories. °Eating 1,200-1,500 calories a day can help most women lose weight. °Eating 1,500-1,800 calories a day can help most men lose weight. °What do I need to know about calorie counting? °Work with your health care provider or dietitian to determine how many calories you should get each day. To meet your daily calorie goal, you will need to: °Find out how many calories are in each food that you would like to eat. Try to do this before you eat. °Decide how much of the food you plan to eat. °Keep a food log. Do this by writing down what you ate and how many calories it had. °To successfully lose weight, it is important to balance calorie counting with a healthy lifestyle that includes regular activity. °Where do I find calorie information? °The number of calories in a food can be found on a Nutrition Facts label. If a food does not have a Nutrition Facts label, try to  look up the calories online or ask your dietitian for help. °Remember that calories are listed per serving. If you choose to have more than one serving of a food, you will have to multiply the calories per serving by the number of servings you plan to eat. For example, the label on a package of bread might say that a serving size is 1 slice and that there are 90 calories in a serving. If you eat 1 slice, you will have eaten 90 calories. If you eat 2 slices, you will have eaten 180 calories. °How do I keep a food log? °After each time that you eat, record the following in your food log as soon as possible: °What you ate. Be sure to include toppings, sauces, and other extras on the food. °How much you ate. This can be measured in cups, ounces, or number of items. °How many calories were in each food and drink. °The total number of calories in the food you ate. °Keep your food log near you, such as in a pocket-sized notebook or on an app or website on your mobile phone. Some programs will calculate calories for you and show you how many calories you have left to meet your daily goal. °What are some portion-control tips? °Know how many calories are in a serving. This will help you know how many servings you can have of a certain food. °  Use a measuring cup to measure serving sizes. You could also try weighing out portions on a kitchen scale. With time, you will be able to estimate serving sizes for some foods. °Take time to put servings of different foods on your favorite plates or in your favorite bowls and cups so you know what a serving looks like. °Try not to eat straight from a food's packaging, such as from a bag or box. Eating straight from the package makes it hard to see how much you are eating and can lead to overeating. Put the amount you would like to eat in a cup or on a plate to make sure you are eating the right portion. °Use smaller plates, glasses, and bowls for smaller portions and to prevent  overeating. °Try not to multitask. For example, avoid watching TV or using your computer while eating. If it is time to eat, sit down at a table and enjoy your food. This will help you recognize when you are full. It will also help you be more mindful of what and how much you are eating. °What are tips for following this plan? °Reading food labels °Check the calorie count compared with the serving size. The serving size may be smaller than what you are used to eating. °Check the source of the calories. Try to choose foods that are high in protein, fiber, and vitamins, and low in saturated fat, trans fat, and sodium. °Shopping °Read nutrition labels while you shop. This will help you make healthy decisions about which foods to buy. °Pay attention to nutrition labels for low-fat or fat-free foods. These foods sometimes have the same number of calories or more calories than the full-fat versions. They also often have added sugar, starch, or salt to make up for flavor that was removed with the fat. °Make a grocery list of lower-calorie foods and stick to it. °Cooking °Try to cook your favorite foods in a healthier way. For example, try baking instead of frying. °Use low-fat dairy products. °Meal planning °Use more fruits and vegetables. One-half of your plate should be fruits and vegetables. °Include lean proteins, such as chicken, turkey, and fish. °Lifestyle °Each week, aim to do one of the following: °150 minutes of moderate exercise, such as walking. °75 minutes of vigorous exercise, such as running. °General information °Know how many calories are in the foods you eat most often. This will help you calculate calorie counts faster. °Find a way of tracking calories that works for you. Get creative. Try different apps or programs if writing down calories does not work for you. °What foods should I eat? ° °Eat nutritious foods. It is better to have a nutritious, high-calorie food, such as an avocado, than a food with  few nutrients, such as a bag of potato chips. °Use your calories on foods and drinks that will fill you up and will not leave you hungry soon after eating. °Examples of foods that fill you up are nuts and nut butters, vegetables, lean proteins, and high-fiber foods such as whole grains. High-fiber foods are foods with more than 5 g of fiber per serving. °Pay attention to calories in drinks. Low-calorie drinks include water and unsweetened drinks. °The items listed above may not be a complete list of foods and beverages you can eat. Contact a dietitian for more information. °What foods should I limit? °Limit foods or drinks that are not good sources of vitamins, minerals, or protein or that are high in unhealthy fats. These include: °  Candy. °Other sweets. °Sodas, specialty coffee drinks, alcohol, and juice. °The items listed above may not be a complete list of foods and beverages you should avoid. Contact a dietitian for more information. °How do I count calories when eating out? °Pay attention to portions. Often, portions are much larger when eating out. Try these tips to keep portions smaller: °Consider sharing a meal instead of getting your own. °If you get your own meal, eat only half of it. Before you start eating, ask for a container and put half of your meal into it. °When available, consider ordering smaller portions from the menu instead of full portions. °Pay attention to your food and drink choices. Knowing the way food is cooked and what is included with the meal can help you eat fewer calories. °If calories are listed on the menu, choose the lower-calorie options. °Choose dishes that include vegetables, fruits, whole grains, low-fat dairy products, and lean proteins. °Choose items that are boiled, broiled, grilled, or steamed. Avoid items that are buttered, battered, fried, or served with cream sauce. Items labeled as crispy are usually fried, unless stated otherwise. °Choose water, low-fat milk,  unsweetened iced tea, or other drinks without added sugar. If you want an alcoholic beverage, choose a lower-calorie option, such as a glass of wine or light beer. °Ask for dressings, sauces, and syrups on the side. These are usually high in calories, so you should limit the amount you eat. °If you want a salad, choose a garden salad and ask for grilled meats. Avoid extra toppings such as bacon, cheese, or fried items. Ask for the dressing on the side, or ask for olive oil and vinegar or lemon to use as dressing. °Estimate how many servings of a food you are given. Knowing serving sizes will help you be aware of how much food you are eating at restaurants. °Where to find more information °Centers for Disease Control and Prevention: www.cdc.gov °[place] Department of Agriculture: myplate.gov °Summary °Calorie counting means keeping track of how many calories you eat and drink each day. If you eat fewer calories than your body needs, you should lose weight. °A healthy amount of weight to lose per week is usually 1-2 lb (0.5-0.9 kg). This usually means reducing your daily calorie intake by 500-750 calories. °The number of calories in a food can be found on a Nutrition Facts label. If a food does not have a Nutrition Facts label, try to look up the calories online or ask your dietitian for help. °Use smaller plates, glasses, and bowls for smaller portions and to prevent overeating. °Use your calories on foods and drinks that will fill you up and not leave you hungry shortly after a meal. °This information is not intended to replace advice given to you by your health care provider. Make sure you discuss any questions you have with your health care provider. °Document Revised: 02/15/2019 Document Reviewed: 02/15/2019 °Elsevier Patient Education © 2022 [employer] ° °Fat and Cholesterol Restricted Eating Plan °Getting too much fat and cholesterol in your diet may cause health problems. Choosing the right foods helps keep  your fat and cholesterol at normal levels. This can keep you from getting certain diseases. °Your doctor may recommend an eating plan that includes: °Total fat: ______% or less of total calories a day. This is ______g of fat a day. °Saturated fat: ______% or less of total calories a day. This is ______g of saturated fat a day. °Cholesterol: less than _________mg a day. °Fiber: ______g a   day. °What are tips for following this plan? °General tips °Work with your doctor to lose weight if you need to. °Avoid: °Foods with added sugar. °Fried foods. °Foods with trans fat or partially hydrogenated oils. This includes some margarines and baked goods. °If you drink alcohol: °Limit how much you have to: °0-1 drink a day for women who are not pregnant. °0-2 drinks a day for men. °Know how much alcohol is in a drink. In the [place] one drink equals one 12 oz bottle of beer (355 mL), one 5 oz glass of wine (148 mL), or one 1½ oz glass of hard liquor (44 mL). °Reading food labels °Check food labels for: °Trans fats. °Partially hydrogenated oils. °Saturated fat (g) in each serving. °Cholesterol (mg) in each serving. °Fiber (g) in each serving. °Choose foods with healthy fats, such as: °Monounsaturated fats and polyunsaturated fats. These include olive and canola oil, flaxseeds, walnuts, almonds, and seeds. °Omega-3 fats. These are found in certain fish, flaxseed oil, and ground flaxseeds. °Choose grain products that have whole grains. Look for the word "whole" as the first word in the ingredient list. °Cooking °Cook foods using low-fat methods. These include baking, boiling, grilling, and broiling. °Eat more home-cooked foods. Eat at restaurants and buffets less often. Eat less fast food. °Avoid cooking using saturated fats, such as butter, cream, palm oil, palm kernel oil, and coconut oil. °Meal planning ° °At meals, divide your plate into four equal parts: °Fill one-half of your plate with vegetables, green salads, and  fruit. °Fill one-fourth of your plate with whole grains. °Fill one-fourth of your plate with low-fat (lean) protein foods. °Eat fish that is high in omega-3 fats at least two times a week. This includes mackerel, tuna, sardines, and salmon. °Eat foods that are high in fiber, such as whole grains, beans, apples, pears, berries, broccoli, carrots, peas, and barley. °What foods should I eat? °Fruits °All fresh, canned (in natural juice), or frozen fruits. °Vegetables °Fresh or frozen vegetables (raw, steamed, roasted, or grilled). Green salads. °Grains °Whole grains, such as whole wheat or whole grain breads, crackers, cereals, and pasta. Unsweetened oatmeal, bulgur, barley, quinoa, or brown rice. Corn or whole wheat flour tortillas. °Meats and other protein foods °Ground beef (85% or leaner), grass-fed beef, or beef trimmed of fat. Skinless chicken or turkey. Ground chicken or turkey. Pork trimmed of fat. All fish and seafood. Egg whites. Dried beans, peas, or lentils. Unsalted nuts or seeds. Unsalted canned beans. Nut butters without added sugar or oil. °Dairy °Low-fat or nonfat dairy products, such as skim or 1% milk, 2% or reduced-fat cheeses, low-fat and fat-free ricotta or cottage cheese, or plain low-fat and nonfat yogurt. °Fats and oils °Tub margarine without trans fats. Light or reduced-fat mayonnaise and salad dressings. Avocado. Olive, canola, sesame, or safflower oils. °The items listed above may not be a complete list of foods and beverages you can eat. Contact a dietitian for more information. °What foods should I avoid? °Fruits °Canned fruit in heavy syrup. Fruit in cream or butter sauce. Fried fruit. °Vegetables °Vegetables cooked in cheese, cream, or butter sauce. Fried vegetables. °Grains °White bread. White pasta. White rice. Cornbread. Bagels, pastries, and croissants. Crackers and snack foods that contain trans fat and hydrogenated oils. °Meats and other protein foods °Fatty cuts of meat. Ribs,  chicken wings, bacon, sausage, bologna, salami, chitterlings, fatback, hot dogs, bratwurst, and packaged lunch meats. Liver and organ meats. Whole eggs and egg yolks. Chicken and turkey with skin. Fried meat. °  Dairy °Whole or 2% milk, cream, half-and-half, and cream cheese. Whole milk cheeses. Whole-fat or sweetened yogurt. Full-fat cheeses. Nondairy creamers and whipped toppings. Processed cheese, cheese spreads, and cheese curds. °Fats and oils °Butter, stick margarine, lard, shortening, ghee, or bacon fat. Coconut, palm kernel, and palm oils. °Beverages °Alcohol. Sugar-sweetened drinks such as sodas, lemonade, and fruit drinks. °Sweets and desserts °Corn syrup, sugars, honey, and molasses. Candy. Jam and jelly. Syrup. Sweetened cereals. Cookies, pies, cakes, donuts, muffins, and ice cream. °The items listed above may not be a complete list of foods and beverages you should avoid. Contact a dietitian for more information. °Summary °Choosing the right foods helps keep your fat and cholesterol at normal levels. This can keep you from getting certain diseases. °At meals, fill one-half of your plate with vegetables, green salads, and fruits. °Eat high fiber foods, like whole grains, beans, apples, pears, berries, carrots, peas, and barley. °Limit added sugar, saturated fats, alcohol, and fried foods. °This information is not intended to replace advice given to you by your health care provider. Make sure you discuss any questions you have with your health care provider. °Document Revised: 05/16/2020 Document Reviewed: 05/16/2020 °Elsevier Patient Education © 2022 [employer] ° °Fat and Cholesterol Restricted Eating Plan °Getting too much fat and cholesterol in your diet may cause health problems. Choosing the right foods helps keep your fat and cholesterol at normal levels. This can keep you from getting certain diseases. °Your doctor may recommend an eating plan that includes: °Total fat: ______% or less of total  calories a day. This is ______g of fat a day. °Saturated fat: ______% or less of total calories a day. This is ______g of saturated fat a day. °Cholesterol: less than _________mg a day. °Fiber: ______g a day. °What are tips for following this plan? °General tips °Work with your doctor to lose weight if you need to. °Avoid: °Foods with added sugar. °Fried foods. °Foods with trans fat or partially hydrogenated oils. This includes some margarines and baked goods. °If you drink alcohol: °Limit how much you have to: °0-1 drink a day for women who are not pregnant. °0-2 drinks a day for men. °Know how much alcohol is in a drink. In the [place] one drink equals one 12 oz bottle of beer (355 mL), one 5 oz glass of wine (148 mL), or one 1½ oz glass of hard liquor (44 mL). °Reading food labels °Check food labels for: °Trans fats. °Partially hydrogenated oils. °Saturated fat (g) in each serving. °Cholesterol (mg) in each serving. °Fiber (g) in each serving. °Choose foods with healthy fats, such as: °Monounsaturated fats and polyunsaturated fats. These include olive and canola oil, flaxseeds, walnuts, almonds, and seeds. °Omega-3 fats. These are found in certain fish, flaxseed oil, and ground flaxseeds. °Choose grain products that have whole grains. Look for the word "whole" as the first word in the ingredient list. °Cooking °Cook foods using low-fat methods. These include baking, boiling, grilling, and broiling. °Eat more home-cooked foods. Eat at restaurants and buffets less often. Eat less fast food. °Avoid cooking using saturated fats, such as butter, cream, palm oil, palm kernel oil, and coconut oil. °Meal planning ° °At meals, divide your plate into four equal parts: °Fill one-half of your plate with vegetables, green salads, and fruit. °Fill one-fourth of your plate with whole grains. °Fill one-fourth of your plate with low-fat (lean) protein foods. °Eat fish that is high in omega-3 fats at least two times a week. This  includes mackerel,   tuna, sardines, and salmon. Eat foods that are high in fiber, such as whole grains, beans, apples, pears, berries, broccoli, carrots, peas, and barley. What foods should I eat? Fruits All fresh, canned (in natural juice), or frozen fruits. Vegetables Fresh or frozen vegetables (raw, steamed, roasted, or grilled). Green salads. Grains Whole grains, such as whole wheat or whole grain breads, crackers, cereals, and pasta. Unsweetened oatmeal, bulgur, barley, quinoa, or brown rice. Corn or whole wheat flour tortillas. Meats and other protein foods Ground beef (85% or leaner), grass-fed beef, or beef trimmed of fat. Skinless chicken or [place]. Ground chicken or [place]. Pork trimmed of fat. All fish and seafood. Egg whites. Dried beans, peas, or lentils. Unsalted nuts or seeds. Unsalted canned beans. Nut butters without added sugar or oil. Dairy Low-fat or nonfat dairy products, such as skim or 1% milk, 2% or reduced-fat cheeses, low-fat and fat-free ricotta or cottage cheese, or plain low-fat and nonfat yogurt. Fats and oils Tub margarine without trans fats. Light or reduced-fat mayonnaise and salad dressings. Avocado. Olive, canola, sesame, or safflower oils. The items listed above may not be a complete list of foods and beverages you can eat. Contact a dietitian for more information. What foods should I avoid? Fruits Canned fruit in heavy syrup. Fruit in cream or butter sauce. Fried fruit. Vegetables Vegetables cooked in cheese, cream, or butter sauce. Fried vegetables. Grains White bread. White pasta. White rice. Cornbread. Bagels, pastries, and croissants. Crackers and snack foods that contain trans fat and hydrogenated oils. Meats and other protein foods Fatty cuts of meat. Ribs, chicken wings, bacon, sausage, bologna, salami, chitterlings, fatback, hot dogs, bratwurst, and packaged lunch meats. Liver and organ meats. Whole eggs and egg yolks. Chicken and [place] with skin.  Fried meat. Dairy Whole or 2% milk, cream, half-and-half, and cream cheese. Whole milk cheeses. Whole-fat or sweetened yogurt. Full-fat cheeses. Nondairy creamers and whipped toppings. Processed cheese, cheese spreads, and cheese curds. Fats and oils Butter, stick margarine, lard, shortening, ghee, or bacon fat. Coconut, palm kernel, and palm oils. Beverages Alcohol. Sugar-sweetened drinks such as sodas, lemonade, and fruit drinks. Sweets and desserts Corn syrup, sugars, honey, and molasses. Candy. Jam and jelly. Syrup. Sweetened cereals. Cookies, pies, cakes, donuts, muffins, and ice cream. The items listed above may not be a complete list of foods and beverages you should avoid. Contact a dietitian for more information. Summary Choosing the right foods helps keep your fat and cholesterol at normal levels. This can keep you from getting certain diseases. At meals, fill one-half of your plate with vegetables, green salads, and fruits. Eat high fiber foods, like whole grains, beans, apples, pears, berries, carrots, peas, and barley. Limit added sugar, saturated fats, alcohol, and fried foods. This information is not intended to replace advice given to you by your health care provider. Make sure you discuss any questions you have with your health care provider. Document Revised: 05/16/2020 Document Reviewed: 05/16/2020 Elsevier Patient Education  2022 [employer].

## 2021-03-11 NOTE — Progress Notes (Signed)
Established patient visit   Patient: Brittney Duran   DOB: August 23, 1970   51 y.o. Female  MRN: [id number] Visit Date: 03/11/2021   Chief Complaint  Patient presents with   Annual Exam   Subjective    HPI  The patient is here for annual wellness visit.  -believes she might be gong through menopause. She has not had menstrual period since December. Has taken several home pregnancy tests which were negative.  She does see GYN provider on 04/20/2021.  -has appointment with new psychiatrist coming up. It is scheduled for  03/31/2021. She will run out of her medication prior to the appointment. She is currently being treated for bipolar disorder.  -weight loss of one pound since last seen.  She did take saxenda for a few weeks. Had to stop when she got the flu in late December. Did have a hard time doing this once daily. States that she just started back to exercise. Adding back medication to help at this time will be a good change for her .   Medications: Outpatient Medications Prior to Visit  Medication Sig   aspirin 81 MG tablet Take 162 mg by mouth daily.   cetirizine-pseudoephedrine (ZYRTEC-D) 5-120 MG per tablet Take 1 tablet by mouth 2 (two) times daily as needed for allergies.   Cholecalciferol (VITAMIN D3) 5000 units CAPS Take 1 capsule by mouth daily.   ferrous sulfate 325 (65 FE) MG tablet Take 325 mg by mouth daily with breakfast.   fluticasone (FLONASE) 50 MCG/ACT nasal spray Place 2 sprays into both nostrils daily.   ibuprofen (ADVIL,MOTRIN) 800 MG tablet Take 1 tablet (800 mg total) by mouth every 8 (eight) hours as needed for moderate pain.   vitamin B-12 (CYANOCOBALAMIN) 1000 MCG tablet Take 1,000 mcg by mouth daily as needed (energy). Reported on 05/08/2015   zinc gluconate 50 MG tablet Take 50 mg by mouth daily.   [DISCONTINUED] ARIPiprazole (ABILIFY) 20 MG tablet Take 1 tablet (20 mg total) by mouth daily.   [DISCONTINUED] buPROPion (WELLBUTRIN XL) 150 MG 24 hr tablet Take 1  tablet by mouth daily   [DISCONTINUED] Eszopiclone 3 MG TABS Take 1 tablet (3 mg total) by mouth at bedtime. Take immediately before bedtime   [DISCONTINUED] hydrOXYzine (VISTARIL) 25 MG capsule Take 1 capsule (25 mg total) by mouth 2 (two) times daily as needed. For anxiety attacks   [DISCONTINUED] lamoTRIgine (LAMICTAL) 200 MG tablet Take 0.5 tablets (100 mg total) by mouth 2 (two) times daily.   [DISCONTINUED] Liraglutide -Weight Management (SAXENDA) 18 MG/3ML SOPN INJECT 0.6 MG UNDER SKIN 1X/DAY X 1 WK THEN INCREASE BY 0.6 MG/DAY EVERY 7 DAYS, [facility] 3 MG/DAY (Patient not taking: Reported on 03/11/2021)   [DISCONTINUED] methylPREDNISolone (MEDROL) 4 MG TBPK tablet Take by mouth as directed for 6 days   [DISCONTINUED] oseltamivir (TAMIFLU) 75 MG capsule Take 1 capsule (75 mg total) by mouth 2 (two) times daily.   [DISCONTINUED] prazosin (MINIPRESS) 1 MG capsule Take 1 capsule (1 mg total) by mouth at bedtime. Night mares   No facility-administered medications prior to visit.    Review of Systems  Constitutional:  Negative for activity change, appetite change, chills, fatigue and fever.       Inability to lose weight.   HENT:  Negative for congestion, postnasal drip, rhinorrhea, sinus pressure, sinus pain, sneezing and sore throat.   Eyes: Negative.   Respiratory:  Negative for cough, chest tightness, shortness of breath and wheezing.   Cardiovascular:  Negative for  chest pain and palpitations.  Gastrointestinal:  Negative for abdominal pain, constipation, diarrhea, nausea and vomiting.  Endocrine: Negative for cold intolerance, heat intolerance, polydipsia and polyuria.  Genitourinary:  Negative for dyspareunia, dysuria, flank pain, frequency and urgency.  Musculoskeletal:  Negative for arthralgias, back pain and myalgias.  Skin:  Negative for rash.  Allergic/Immunologic: Negative for environmental allergies.  Neurological:  Negative for dizziness, weakness and headaches.  Hematological:   Negative for adenopathy.  Psychiatric/Behavioral:  Positive for dysphoric mood and sleep disturbance. The patient is nervous/anxious.      Objective     Today's Vitals   03/11/21 0927  BP: 108/73  Pulse: 71  Temp: 98.5 F (36.9 C)  SpO2: 99%  Weight: 236 lb 3.2 oz (107.1 kg)  Height: 5' 4" (1.626 m)   Body mass index is 40.54 kg/m.   BP Readings from Last 3 Encounters:  03/11/21 108/73  12/03/20 109/71  11/05/20 112/72    Wt Readings from Last 3 Encounters:  03/11/21 236 lb 3.2 oz (107.1 kg)  01/01/21 237 lb 3.2 oz (107.6 kg)  12/03/20 237 lb 3.2 oz (107.6 kg)    Physical Exam Vitals and nursing note reviewed.  Constitutional:      Appearance: Normal appearance. She is well-developed. She is obese.  HENT:     Head: Normocephalic and atraumatic.     Right Ear: Tympanic membrane, ear canal and external ear normal.     Left Ear: Tympanic membrane, ear canal and external ear normal.     Nose: Nose normal.     Mouth/Throat:     Mouth: Mucous membranes are moist.     Pharynx: Oropharynx is clear.  Eyes:     Extraocular Movements: Extraocular movements intact.     Conjunctiva/sclera: Conjunctivae normal.     Pupils: Pupils are equal, round, and reactive to light.  Cardiovascular:     Rate and Rhythm: Normal rate and regular rhythm.     Pulses: Normal pulses.     Heart sounds: Normal heart sounds.  Pulmonary:     Effort: Pulmonary effort is normal.     Breath sounds: Normal breath sounds.  Abdominal:     General: Bowel sounds are normal. There is no distension.     Palpations: Abdomen is soft. There is no mass.     Tenderness: There is no abdominal tenderness. There is no guarding or rebound.     Hernia: No hernia is present.  Musculoskeletal:        General: Normal range of motion.     Cervical back: Normal range of motion and neck supple.  Lymphadenopathy:     Cervical: No cervical adenopathy.  Skin:    General: Skin is warm and dry.     Capillary Refill:  Capillary refill takes less than 2 seconds.  Neurological:     General: No focal deficit present.     Mental Status: She is alert and oriented to person, place, and time.  Psychiatric:        Mood and Affect: Mood normal.        Behavior: Behavior normal.        Thought Content: Thought content normal.        Judgment: Judgment normal.     Results for orders placed or performed in visit on 03/11/21  T4, free  Result Value Ref Range   Free T4 1.11 0.82 - 1.77 ng/dL  Lipid panel  Result Value Ref Range   Cholesterol, Total 147 100 -  199 mg/dL   Triglycerides 58 0 - 149 mg/dL   HDL 42 >39 mg/dL   VLDL Cholesterol Cal 12 5 - 40 mg/dL   LDL Chol Calc (NIH) 93 0 - 99 mg/dL   Chol/HDL Ratio 3.5 0.0 - 4.4 ratio  Comp Met (CMET)  Result Value Ref Range   Glucose 97 70 - 99 mg/dL   BUN 11 6 - 24 mg/dL   Creatinine, Ser 0.79 0.57 - 1.00 mg/dL   eGFR 92 >59 mL/min/1.73   BUN/Creatinine Ratio 14 9 - 23   Sodium 138 134 - 144 mmol/L   Potassium 4.6 3.5 - 5.2 mmol/L   Chloride 104 96 - 106 mmol/L   CO2 21 20 - 29 mmol/L   Calcium 9.4 8.7 - 10.2 mg/dL   Total Protein 7.0 6.0 - 8.5 g/dL   Albumin 4.4 3.8 - 4.8 g/dL   Globulin, Total 2.6 1.5 - 4.5 g/dL   Albumin/Globulin Ratio 1.7 1.2 - 2.2   Bilirubin Total 0.7 0.0 - 1.2 mg/dL   Alkaline Phosphatase 55 44 - 121 IU/L   AST 16 0 - 40 IU/L   ALT 12 0 - 32 IU/L  CBC  Result Value Ref Range   WBC 5.0 3.4 - 10.8 x10E3/uL   RBC 4.55 3.77 - 5.28 x10E6/uL   Hemoglobin 13.2 11.1 - 15.9 g/dL   Hematocrit 39.4 34.0 - 46.6 %   MCV 87 79 - 97 fL   MCH 29.0 26.6 - 33.0 pg   MCHC 33.5 31.5 - 35.7 g/dL   RDW 12.2 11.7 - 15.4 %   Platelets 241 150 - 450 x10E3/uL  Hemoglobin A1c  Result Value Ref Range   Hgb A1c MFr Bld 5.1 4.8 - 5.6 %   Est. average glucose Bld gHb Est-mCnc 100 mg/dL  TSH  Result Value Ref Range   TSH 2.800 0.450 - 4.500 uIU/mL    Assessment & Plan    1. Encounter for general adult medical examination with abnormal  findings Annual wellness visit today.   2. BMI 40.0-44.9, adult (HCC) Trial wegovy 0.14m weekly. Discussed lowering calorie intake to 1500 calories per day and incorporating exercise into daily routine to help lose weight.  - Semaglutide-Weight Management (WEGOVY) 0.25 MG/0.5ML SOAJ; Inject 0.25 mg into the skin once a week.  Dispense: 2 mL; Refill: 1  3. Acquired hypothyroidism Check thyroid panel and treat as indicated.  - T4, free; Future - T4, free  4. Schizoaffective disorder, bipolar type ([place] Renewed mental health medication through establishment of new psychiatric provider.  - lamoTRIgine (LAMICTAL) 200 MG tablet; Take 0.5 tablets (100 mg total) by mouth 2 (two) times daily.  Dispense: 90 tablet; Refill: 0 - ARIPiprazole (ABILIFY) 20 MG tablet; Take 1 tablet (20 mg total) by mouth daily.  Dispense: 90 tablet; Refill: 0  5. Panic disorder Renewed mental health medication until patient is able to establish new psychiatric provider.  - prazosin (MINIPRESS) 1 MG capsule; Take 1 capsule (1 mg total) by mouth at bedtime. Night mares  Dispense: 90 capsule; Refill: 0 - hydrOXYzine (VISTARIL) 25 MG capsule; Take 1 capsule (25 mg total) by mouth 2 (two) times daily as needed. 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 Renew until patient able to establish new psychiatric  provider.  - prazosin (MINIPRESS) 1 MG capsule; Take 1 capsule (1 mg total) by mouth at bedtime. Night mares  Dispense: 90 capsule; Refill: 0 - Eszopiclone 3 MG TABS; Take 1 tablet (3 mg total) by mouth at bedtime. Take immediately before bedtime  Dispense: 30 tablet; Refill: 1  7. 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 Renew until patient is able to establish new  psychiatric provider.  - ARIPiprazole (ABILIFY) 20 MG tablet; Take 1 tablet (20 mg total) by mouth daily.  Dispense: 90 tablet; Refill: 0  8. Screening for endocrine, nutritional, metabolic and immunity disorder Check routine, fasting labs during today's visit.  - TSH; Future - Hemoglobin A1c; Future - CBC; Future - Comp Met (CMET); Future - Lipid panel; Future - Lipid panel - Comp Met (CMET) - CBC - Hemoglobin A1c - TSH  9. Health care maintenance Routine, fasting labs drawn during today's visit.  - TSH; Future - Hemoglobin A1c; Future - CBC; Future - Comp Met (CMET); Future - Lipid panel; Future - Lipid panel - Comp Met (CMET) - CBC - Hemoglobin A1c - TSH    Problem List Items Addressed This Visit       Endocrine   Acquired hypothyroidism   Relevant Orders   T4, free (Completed)     Nervous and Auditory   Neuroleptic induced parkinsonism (HCC)   Relevant Medications   ARIPiprazole (ABILIFY) 20 MG tablet     Other   Panic disorder   Relevant Medications   prazosin (MINIPRESS) 1 MG capsule   hydrOXYzine (VISTARIL) 25 MG capsule   buPROPion (WELLBUTRIN XL) 150 MG 24 hr tablet   Insomnia due to mental condition   Relevant Medications   prazosin (MINIPRESS) 1 MG capsule   Eszopiclone 3 MG TABS   Schizoaffective disorder, bipolar type (HCC)   Relevant Medications   lamoTRIgine (LAMICTAL) 200 MG tablet   ARIPiprazole (ABILIFY) 20 MG tablet   Encounter for general adult medical examination with abnormal findings - Primary   Screening for endocrine, nutritional, metabolic and immunity disorder   Relevant Orders   TSH (Completed)   Hemoglobin A1c (Completed)   CBC (Completed)   Comp Met (CMET) (Completed)   Lipid panel (Completed)   BMI 40.0-44.9, adult (HCC)   Relevant Medications   Semaglutide-Weight Management (WEGOVY) 0.25 MG/0.5ML SOAJ   Other Visit Diagnoses     Health care maintenance       Relevant Orders   TSH (Completed)   Hemoglobin A1c  (Completed)   CBC (Completed)   Comp Met (CMET) (Completed)   Lipid panel (Completed)        Return in about 4 weeks (around 04/08/2021) for routine - weight management - saxenda to wegovy .         Ronnell Freshwater, NP  [facility] [phone] (phone) [phone] (fax)  [place]

## 2021-03-12 ENCOUNTER — Encounter: Payer: Self-pay | Admitting: Nurse Practitioner

## 2021-03-12 LAB — LIPID PANEL
Chol/HDL Ratio: 3.5 ratio (ref 0.0–4.4)
Cholesterol, Total: 147 mg/dL (ref 100–199)
HDL: 42 mg/dL (ref >39)
LDL Chol Calc (NIH): 93 mg/dL (ref 0–99)
Triglycerides: 58 mg/dL (ref 0–149)
VLDL Cholesterol Cal: 12 mg/dL (ref 5–40)

## 2021-03-12 LAB — CBC
Hematocrit: 39.4 % (ref 34.0–46.6)
Hemoglobin: 13.2 g/dL (ref 11.1–15.9)
MCH: 29 pg (ref 26.6–33.0)
MCHC: 33.5 g/dL (ref 31.5–35.7)
MCV: 87 fL (ref 79–97)
Platelets: 241 10*3/uL (ref 150–450)
RBC: 4.55 x10E6/uL (ref 3.77–5.28)
RDW: 12.2 % (ref 11.7–15.4)
WBC: 5 10*3/uL (ref 3.4–10.8)

## 2021-03-12 LAB — COMPREHENSIVE METABOLIC PANEL
ALT: 12 IU/L (ref 0–32)
AST: 16 IU/L (ref 0–40)
Albumin/Globulin Ratio: 1.7 (ref 1.2–2.2)
Albumin: 4.4 g/dL (ref 3.8–4.8)
Alkaline Phosphatase: 55 IU/L (ref 44–121)
BUN/Creatinine Ratio: 14 (ref 9–23)
BUN: 11 mg/dL (ref 6–24)
Bilirubin Total: 0.7 mg/dL (ref 0.0–1.2)
CO2: 21 mmol/L (ref 20–29)
Calcium: 9.4 mg/dL (ref 8.7–10.2)
Chloride: 104 mmol/L (ref 96–106)
Creatinine, Ser: 0.79 mg/dL (ref 0.57–1.00)
Globulin, Total: 2.6 g/dL (ref 1.5–4.5)
Glucose: 97 mg/dL (ref 70–99)
Potassium: 4.6 mmol/L (ref 3.5–5.2)
Sodium: 138 mmol/L (ref 134–144)
Total Protein: 7 g/dL (ref 6.0–8.5)
eGFR: 92 mL/min/{1.73_m2} (ref >59)

## 2021-03-12 LAB — TSH: TSH: 2.8 u[IU]/mL (ref 0.450–4.500)

## 2021-03-12 LAB — HEMOGLOBIN A1C
Est. average glucose Bld gHb Est-mCnc: 100 mg/dL
Hgb A1c MFr Bld: 5.1 % (ref 4.8–5.6)

## 2021-03-12 LAB — T4, FREE: Free T4: 1.11 ng/dL (ref 0.82–1.77)

## 2021-03-12 NOTE — Progress Notes (Signed)
All labs WNL. MyChart message sent to patient

## 2021-03-15 DIAGNOSIS — E039 Hypothyroidism, unspecified: Secondary | ICD-10-CM | POA: Insufficient documentation

## 2021-03-15 DIAGNOSIS — Z6841 Body Mass Index (BMI) 40.0 and over, adult: Secondary | ICD-10-CM | POA: Insufficient documentation

## 2021-03-24 ENCOUNTER — Other Ambulatory Visit: Payer: Self-pay | Admitting: Nurse Practitioner

## 2021-03-24 DIAGNOSIS — Z8673 Personal history of transient ischemic attack (TIA), and cerebral infarction without residual deficits: Secondary | ICD-10-CM | POA: Diagnosis not present

## 2021-03-24 DIAGNOSIS — N938 Other specified abnormal uterine and vaginal bleeding: Secondary | ICD-10-CM | POA: Diagnosis not present

## 2021-03-24 DIAGNOSIS — F4312 Post-traumatic stress disorder, chronic: Secondary | ICD-10-CM | POA: Diagnosis not present

## 2021-03-24 DIAGNOSIS — D251 Intramural leiomyoma of uterus: Secondary | ICD-10-CM | POA: Diagnosis not present

## 2021-03-24 DIAGNOSIS — F3175 Bipolar disorder, in partial remission, most recent episode depressed: Secondary | ICD-10-CM | POA: Diagnosis not present

## 2021-03-24 DIAGNOSIS — F411 Generalized anxiety disorder: Secondary | ICD-10-CM | POA: Diagnosis not present

## 2021-03-24 DIAGNOSIS — F5105 Insomnia due to other mental disorder: Secondary | ICD-10-CM | POA: Diagnosis not present

## 2021-03-24 DIAGNOSIS — F250 Schizoaffective disorder, bipolar type: Secondary | ICD-10-CM

## 2021-03-31 DIAGNOSIS — N920 Excessive and frequent menstruation with regular cycle: Secondary | ICD-10-CM | POA: Diagnosis not present

## 2021-03-31 DIAGNOSIS — N938 Other specified abnormal uterine and vaginal bleeding: Secondary | ICD-10-CM | POA: Diagnosis not present

## 2021-04-07 ENCOUNTER — Ambulatory Visit: Payer: BC Managed Care – PPO | Admitting: Nurse Practitioner

## 2021-04-15 DIAGNOSIS — F411 Generalized anxiety disorder: Secondary | ICD-10-CM | POA: Diagnosis not present

## 2021-04-15 DIAGNOSIS — F3175 Bipolar disorder, in partial remission, most recent episode depressed: Secondary | ICD-10-CM | POA: Diagnosis not present

## 2021-04-15 DIAGNOSIS — F4312 Post-traumatic stress disorder, chronic: Secondary | ICD-10-CM | POA: Diagnosis not present

## 2021-04-15 DIAGNOSIS — F5105 Insomnia due to other mental disorder: Secondary | ICD-10-CM | POA: Diagnosis not present

## 2021-04-18 DIAGNOSIS — F3132 Bipolar disorder, current episode depressed, moderate: Secondary | ICD-10-CM | POA: Diagnosis not present

## 2021-04-18 DIAGNOSIS — N938 Other specified abnormal uterine and vaginal bleeding: Secondary | ICD-10-CM | POA: Diagnosis not present

## 2021-04-18 DIAGNOSIS — Z01419 Encounter for gynecological examination (general) (routine) without abnormal findings: Secondary | ICD-10-CM | POA: Diagnosis not present

## 2021-04-18 DIAGNOSIS — F4312 Post-traumatic stress disorder, chronic: Secondary | ICD-10-CM | POA: Diagnosis not present

## 2021-04-20 DIAGNOSIS — Z01419 Encounter for gynecological examination (general) (routine) without abnormal findings: Secondary | ICD-10-CM | POA: Diagnosis not present

## 2021-04-20 DIAGNOSIS — N938 Other specified abnormal uterine and vaginal bleeding: Secondary | ICD-10-CM | POA: Diagnosis not present

## 2021-04-20 DIAGNOSIS — N9489 Other specified conditions associated with female genital organs and menstrual cycle: Secondary | ICD-10-CM | POA: Diagnosis not present

## 2021-04-24 DIAGNOSIS — Z1231 Encounter for screening mammogram for malignant neoplasm of breast: Secondary | ICD-10-CM | POA: Diagnosis not present

## 2021-05-06 ENCOUNTER — Encounter: Payer: Self-pay | Admitting: Nurse Practitioner

## 2021-05-06 DIAGNOSIS — F411 Generalized anxiety disorder: Secondary | ICD-10-CM | POA: Diagnosis not present

## 2021-05-06 DIAGNOSIS — F3175 Bipolar disorder, in partial remission, most recent episode depressed: Secondary | ICD-10-CM | POA: Diagnosis not present

## 2021-05-06 DIAGNOSIS — F5105 Insomnia due to other mental disorder: Secondary | ICD-10-CM | POA: Diagnosis not present

## 2021-05-06 DIAGNOSIS — F4312 Post-traumatic stress disorder, chronic: Secondary | ICD-10-CM | POA: Diagnosis not present

## 2021-05-07 ENCOUNTER — Ambulatory Visit: Payer: BC Managed Care – PPO | Admitting: Nurse Practitioner

## 2021-05-07 ENCOUNTER — Telehealth: Payer: Self-pay | Admitting: Nurse Practitioner

## 2021-05-07 NOTE — Telephone Encounter (Signed)
Recv'd records from [facility] forwarded 8 pages to Dr. Vincent Gros 4/20/23fbg ?

## 2021-05-08 ENCOUNTER — Other Ambulatory Visit: Payer: Self-pay | Admitting: Nurse Practitioner

## 2021-05-08 ENCOUNTER — Encounter: Payer: Self-pay | Admitting: Nurse Practitioner

## 2021-05-08 ENCOUNTER — Ambulatory Visit (INDEPENDENT_AMBULATORY_CARE_PROVIDER_SITE_OTHER): Payer: BC Managed Care – PPO | Admitting: Nurse Practitioner

## 2021-05-08 VITALS — BP 99/67 | HR 70 | Temp 97.7°F | Ht 64.17 in | Wt 231.8 lb

## 2021-05-08 DIAGNOSIS — Z6839 Body mass index (BMI) 39.0-39.9, adult: Secondary | ICD-10-CM

## 2021-05-08 DIAGNOSIS — H1013 Acute atopic conjunctivitis, bilateral: Secondary | ICD-10-CM

## 2021-05-08 DIAGNOSIS — F410 Panic disorder [episodic paroxysmal anxiety] without agoraphobia: Secondary | ICD-10-CM

## 2021-05-08 DIAGNOSIS — F5105 Insomnia due to other mental disorder: Secondary | ICD-10-CM

## 2021-05-08 MED ORDER — WEGOVY 0.5 MG/0.5ML ~~LOC~~ SOAJ: 0.5000 mg | SUBCUTANEOUS | 1 refills | Status: DC

## 2021-05-08 MED ORDER — OLOPATADINE HCL 0.2 % OP SOLN: 1.0000 [drp] | Freq: Every day | OPHTHALMIC | 3 refills | Status: AC

## 2021-05-08 NOTE — Progress Notes (Signed)
Established patient visit ? ? ?Patient: Brittney Duran   DOB: 06/30/1970   51 y.o. Female  MRN: [id number] ?Visit Date: 05/08/2021 ? ? ?Chief Complaint  ?Patient presents with  ? Weight Loss  ? ?Subjective  ?  ?HPI  ?Patient here for follow up  ?-started on wegovy 0.63m weekly.  ?-has lost 5 pounds since starting.  ?-no negative side effects from taking this medication.  ?-today, allergies really bothering her.  ?--eyes are watering  ?--ears feel clogged, difficulty hearing, especially out of the left ear . ?--takes Zyrtec D which generally helps.  ? ? ?Medications: ?Outpatient Medications Prior to Visit  ?Medication Sig  ? ARIPiprazole (ABILIFY) 20 MG tablet Take 1 tablet (20 mg total) by mouth daily.  ? aspirin 81 MG tablet Take 162 mg by mouth daily.  ? buPROPion (WELLBUTRIN SR) 150 MG 12 hr tablet Take by mouth every morning.  ? buPROPion (WELLBUTRIN XL) 150 MG 24 hr tablet Take 1 tablet by mouth daily (Patient not taking: Reported on 05/08/2021)  ? cetirizine-pseudoephedrine (ZYRTEC-D) 5-120 MG per tablet Take 1 tablet by mouth 2 (two) times daily as needed for allergies.  ? Cholecalciferol (VITAMIN D3) 5000 units CAPS Take 1 capsule by mouth daily.  ? Eszopiclone 3 MG TABS Take 1 tablet (3 mg total) by mouth at bedtime. Take immediately before bedtime  ? ferrous sulfate 325 (65 FE) MG tablet Take 325 mg by mouth daily with breakfast.  ? fluticasone (FLONASE) 50 MCG/ACT nasal spray Place 2 sprays into both nostrils daily.  ? hydrOXYzine (VISTARIL) 25 MG capsule Take 1 capsule (25 mg total) by mouth 2 (two) times daily as needed. For anxiety attacks  ? ibuprofen (ADVIL,MOTRIN) 800 MG tablet Take 1 tablet (800 mg total) by mouth every 8 (eight) hours as needed for moderate pain.  ? lamoTRIgine (LAMICTAL) 200 MG tablet Take 0.5 tablets (100 mg total) by mouth 2 (two) times daily.  ? prazosin (MINIPRESS) 1 MG capsule Take 1 capsule (1 mg total) by mouth at bedtime. Night mares  ? vitamin B-12 (CYANOCOBALAMIN) 1000  MCG tablet Take 1,000 mcg by mouth daily as needed (energy). Reported on 05/08/2015  ? zinc gluconate 50 MG tablet Take 50 mg by mouth daily.  ? [DISCONTINUED] SAXENDA 18 MG/3ML SOPN SMARTSIG:Topical  ? [DISCONTINUED] Semaglutide-Weight Management (WEGOVY) 0.25 MG/0.5ML SOAJ Inject 0.25 mg into the skin once a week.  ? ?No facility-administered medications prior to visit.  ? ? ?Review of Systems  ?Constitutional:  Negative for activity change, appetite change, chills, fatigue and fever.  ?     Weight loss five pound since her most recent visit   ?HENT:  Positive for congestion. Negative for postnasal drip, rhinorrhea, sinus pressure, sinus pain, sneezing and sore throat.   ?Eyes: Negative.   ?     Eye watering due due to allergies   ?Respiratory:  Negative for cough, chest tightness, shortness of breath and wheezing.   ?Cardiovascular:  Negative for chest pain and palpitations.  ?Gastrointestinal:  Negative for abdominal pain, constipation, diarrhea, nausea and vomiting.  ?Endocrine: Negative for cold intolerance, heat intolerance, polydipsia and polyuria.  ?Genitourinary:  Negative for dyspareunia, dysuria, flank pain, frequency and urgency.  ?Musculoskeletal:  Negative for arthralgias, back pain and myalgias.  ?Skin:  Negative for rash.  ?Allergic/Immunologic: Negative for environmental allergies.  ?Neurological:  Negative for dizziness, weakness and headaches.  ?Hematological:  Negative for adenopathy.  ?Psychiatric/Behavioral:  Positive for dysphoric mood. The patient is nervous/anxious.   ?  Sees psychiatry on routine basis.   ? ?Last CBC ?Lab Results  ?Component Value Date  ? WBC 5.0 03/11/2021  ? HGB 13.2 03/11/2021  ? HCT 39.4 03/11/2021  ? MCV 87 03/11/2021  ? MCH 29.0 03/11/2021  ? RDW 12.2 03/11/2021  ? PLT 241 03/11/2021  ? ?Last metabolic panel ?Lab Results  ?Component Value Date  ? GLUCOSE 97 03/11/2021  ? NA 138 03/11/2021  ? K 4.6 03/11/2021  ? CL 104 03/11/2021  ? CO2 21 03/11/2021  ? BUN 11  03/11/2021  ? CREATININE 0.79 03/11/2021  ? EGFR 92 03/11/2021  ? CALCIUM 9.4 03/11/2021  ? PROT 7.0 03/11/2021  ? ALBUMIN 4.4 03/11/2021  ? LABGLOB 2.6 03/11/2021  ? AGRATIO 1.7 03/11/2021  ? BILITOT 0.7 03/11/2021  ? ALKPHOS 55 03/11/2021  ? AST 16 03/11/2021  ? ALT 12 03/11/2021  ? ANIONGAP 6 04/22/2020  ? ?Last lipids ?Lab Results  ?Component Value Date  ? CHOL 147 03/11/2021  ? HDL 42 03/11/2021  ? LDLCALC 93 03/11/2021  ? TRIG 58 03/11/2021  ? CHOLHDL 3.5 03/11/2021  ? ?Last hemoglobin A1c ?Lab Results  ?Component Value Date  ? HGBA1C 5.1 03/11/2021  ? ?Last thyroid functions ?Lab Results  ?Component Value Date  ? TSH 2.800 03/11/2021  ? ?Last vitamin D ?Lab Results  ?Component Value Date  ? VD25OH 27.01 (L) 08/31/2019  ? ?  ? ? Objective  ?  ? ?Today's Vitals  ? 05/08/21 0917  ?BP: 99/67  ?Pulse: 70  ?Temp: 97.7 ?F (36.5 ?C)  ?SpO2: 99%  ?Weight: 231 lb 12.8 oz (105.1 kg)  ?Height: 5' 4.17" (1.63 m)  ? ?Body mass index is 39.57 kg/m?.  ? ?BP Readings from Last 3 Encounters:  ?05/08/21 99/67  ?03/11/21 108/73  ?12/03/20 109/71  ?  ?Wt Readings from Last 3 Encounters:  ?05/08/21 231 lb 12.8 oz (105.1 kg)  ?03/11/21 236 lb 3.2 oz (107.1 kg)  ?01/01/21 237 lb 3.2 oz (107.6 kg)  ?  ?Physical Exam ?Vitals and nursing note reviewed.  ?Constitutional:   ?   Appearance: Normal appearance. She is well-developed. She is obese.  ?HENT:  ?   Head: Normocephalic and atraumatic.  ?   Right Ear: Tympanic membrane, ear canal and external ear normal.  ?   Left Ear: Tympanic membrane, ear canal and external ear normal.  ?   Nose: Congestion present.  ?   Mouth/Throat:  ?   Mouth: Mucous membranes are moist.  ?   Pharynx: Oropharynx is clear.  ?Eyes:  ?   Extraocular Movements: Extraocular movements intact.  ?   Pupils: Pupils are equal, round, and reactive to light.  ?   Comments: Sclera and conjunctiva red and inflamed appearing. Some welling present of both lower eyelids.   ?Cardiovascular:  ?   Rate and Rhythm: Normal rate  and regular rhythm.  ?   Pulses: Normal pulses.  ?   Heart sounds: Normal heart sounds.  ?Pulmonary:  ?   Effort: Pulmonary effort is normal.  ?   Breath sounds: Normal breath sounds.  ?Abdominal:  ?   Palpations: Abdomen is soft.  ?Musculoskeletal:     ?   General: Normal range of motion.  ?   Cervical back: Normal range of motion and neck supple.  ?Lymphadenopathy:  ?   Cervical: No cervical adenopathy.  ?Skin: ?   General: Skin is warm and dry.  ?   Capillary Refill: Capillary refill takes less than 2 seconds.  ?  Neurological:  ?   General: No focal deficit present.  ?   Mental Status: She is alert and oriented to person, place, and time.  ?Psychiatric:     ?   Mood and Affect: Mood normal.     ?   Behavior: Behavior normal.     ?   Thought Content: Thought content normal.     ?   Judgment: Judgment normal.  ? ? Assessment & Plan  ?  ?1. Allergic conjunctivitis of both eyes ?Continue oral antihistamine treatments as before. Add pataday eye drops. Use one drop in both eyes daily.  ?- Olopatadine HCl 0.2 % SOLN; Apply 1 drop to eye daily at 6 (six) AM.  Dispense: 2.5 mL; Refill: 3 ? ?2. Body mass index (BMI) of 39.0-39.9 in adult ?Weight loss of five pounds since last visit. Increase dose Wegovy to 0.20m weekly. Discussed lowering calorie intake to 1500 calories per day and incorporating exercise into daily routine to help lose weight.  ?- Semaglutide-Weight Management (WEGOVY) 0.5 MG/0.5ML SOAJ; Inject 0.5 mg into the skin once a week.  Dispense: 2 mL; Refill: 1  ? ?Problem List Items Addressed This Visit   ?None ?Visit Diagnoses   ? ? Allergic conjunctivitis of both eyes    -  Primary  ? Relevant Medications  ? Olopatadine HCl 0.2 % SOLN  ? Body mass index (BMI) of 39.0-39.9 in adult      ? Relevant Medications  ? Semaglutide-Weight Management (WEGOVY) 0.5 MG/0.5ML SOAJ  ? ?  ?  ? ?Return in 2 months (on 07/08/2021).  ?   ? ? ? ? ?HRonnell Freshwater NP  ?[facility] [facility]?[phone] (phone) ?[phone](fax) ? ?[facility]  ?

## 2021-05-11 DIAGNOSIS — F410 Panic disorder [episodic paroxysmal anxiety] without agoraphobia: Secondary | ICD-10-CM | POA: Diagnosis not present

## 2021-05-11 DIAGNOSIS — F4312 Post-traumatic stress disorder, chronic: Secondary | ICD-10-CM | POA: Diagnosis not present

## 2021-05-11 DIAGNOSIS — F3132 Bipolar disorder, current episode depressed, moderate: Secondary | ICD-10-CM | POA: Diagnosis not present

## 2021-05-11 DIAGNOSIS — F250 Schizoaffective disorder, bipolar type: Secondary | ICD-10-CM | POA: Diagnosis not present

## 2021-06-02 DIAGNOSIS — F4312 Post-traumatic stress disorder, chronic: Secondary | ICD-10-CM | POA: Diagnosis not present

## 2021-06-02 DIAGNOSIS — F3132 Bipolar disorder, current episode depressed, moderate: Secondary | ICD-10-CM | POA: Diagnosis not present

## 2021-06-02 DIAGNOSIS — F410 Panic disorder [episodic paroxysmal anxiety] without agoraphobia: Secondary | ICD-10-CM | POA: Diagnosis not present

## 2021-06-02 DIAGNOSIS — F250 Schizoaffective disorder, bipolar type: Secondary | ICD-10-CM | POA: Diagnosis not present

## 2021-06-09 ENCOUNTER — Other Ambulatory Visit: Payer: Self-pay | Admitting: Nurse Practitioner

## 2021-06-09 DIAGNOSIS — F410 Panic disorder [episodic paroxysmal anxiety] without agoraphobia: Secondary | ICD-10-CM

## 2021-06-18 ENCOUNTER — Telehealth: Payer: Self-pay | Admitting: Nurse Practitioner

## 2021-06-18 ENCOUNTER — Other Ambulatory Visit: Payer: Self-pay | Admitting: Nurse Practitioner

## 2021-06-18 DIAGNOSIS — Z6839 Body mass index (BMI) 39.0-39.9, adult: Secondary | ICD-10-CM

## 2021-06-18 MED ORDER — SAXENDA 18 MG/3ML ~~LOC~~ SOPN: PEN_INJECTOR | SUBCUTANEOUS | 1 refills | Status: DC

## 2021-06-18 MED ORDER — PEN NEEDLES 30G X 5 MM MISC: 1 refills | Status: AC

## 2021-06-18 NOTE — Telephone Encounter (Signed)
Ok thanks 

## 2021-06-18 NOTE — Progress Notes (Signed)
Sent new prescription for saxenda and pen needles to [facility] in [place]

## 2021-06-18 NOTE — Telephone Encounter (Signed)
Patient called office stating she is unable to get the [facility] due to this medication being on back order. Patient advised to call around to different pharmacies to see if she is able to find the medication. Advised there is a Sport and exercise psychologist of this medication. Patient was agreeable and is going to call. AS, CMA

## 2021-06-18 NOTE — Telephone Encounter (Signed)
Patient called back stating she is unable to get the [facility]. She has called to different pharmacies. She is asking for a call back. [phone]. AS, CMA

## 2021-06-18 NOTE — Telephone Encounter (Signed)
Please let the patient know that I sent new prescription for saxenda and pen needles to [facility] in [employer]. Thanks so much.   -HB

## 2021-06-18 NOTE — Telephone Encounter (Signed)
Called pt she is advised of the back order she stated that she would like to try Saxenda she also stated that she been on Saxenda before

## 2021-06-30 DIAGNOSIS — F4312 Post-traumatic stress disorder, chronic: Secondary | ICD-10-CM | POA: Diagnosis not present

## 2021-06-30 DIAGNOSIS — F250 Schizoaffective disorder, bipolar type: Secondary | ICD-10-CM | POA: Diagnosis not present

## 2021-06-30 DIAGNOSIS — F3132 Bipolar disorder, current episode depressed, moderate: Secondary | ICD-10-CM | POA: Diagnosis not present

## 2021-06-30 DIAGNOSIS — F410 Panic disorder [episodic paroxysmal anxiety] without agoraphobia: Secondary | ICD-10-CM | POA: Diagnosis not present

## 2021-07-06 DIAGNOSIS — F3175 Bipolar disorder, in partial remission, most recent episode depressed: Secondary | ICD-10-CM | POA: Diagnosis not present

## 2021-07-06 DIAGNOSIS — F411 Generalized anxiety disorder: Secondary | ICD-10-CM | POA: Diagnosis not present

## 2021-07-06 DIAGNOSIS — F5105 Insomnia due to other mental disorder: Secondary | ICD-10-CM | POA: Diagnosis not present

## 2021-07-06 DIAGNOSIS — F4312 Post-traumatic stress disorder, chronic: Secondary | ICD-10-CM | POA: Diagnosis not present

## 2021-07-07 NOTE — Progress Notes (Signed)
Established patient visit   Patient: Brittney Duran   DOB: August 02, 1970   51 y.o. Female  MRN: [id number] Visit Date: 07/08/2021   Chief Complaint  Patient presents with   Weight Check   Subjective    HPI  Weight management.  -started on Saxenda as wegovy is not available at lower doses at this time. 03/11/2021.  -initial weight 03/11/2021 - 236 -most recent weight 05/08/2021 - 231 - wegovy increased to 0.5 mg weekly  -today's weight - 07/08/2021 - 228 pounds  -Weight loss since last visit -  3 pounds since last visit  -total weight loss since starting saxenda - 8 pounds    Medications: Outpatient Medications Prior to Visit  Medication Sig   ARIPiprazole (ABILIFY) 20 MG tablet Take 1 tablet (20 mg total) by mouth daily.   Asenapine Maleate 10 MG SUBL Place under the tongue.   aspirin 81 MG tablet Take 162 mg by mouth daily.   buPROPion (WELLBUTRIN SR) 150 MG 12 hr tablet Take by mouth every morning.   buPROPion (WELLBUTRIN XL) 150 MG 24 hr tablet TAKE 1 TABLET BY MOUTH EVERY DAY   cetirizine-pseudoephedrine (ZYRTEC-D) 5-120 MG per tablet Take 1 tablet by mouth 2 (two) times daily as needed for allergies.   Cholecalciferol (VITAMIN D3) 5000 units CAPS Take 1 capsule by mouth daily.   Eszopiclone 3 MG TABS Take 1 tablet (3 mg total) by mouth at bedtime. Take immediately before bedtime   ferrous sulfate 325 (65 FE) MG tablet Take 325 mg by mouth daily with breakfast.   fluticasone (FLONASE) 50 MCG/ACT nasal spray Place 2 sprays into both nostrils daily.   hydrOXYzine (VISTARIL) 25 MG capsule TAKE 1 CAPSULE (25 MG TOTAL) BY MOUTH 2 (TWO) TIMES DAILY AS NEEDED. FOR ANXIETY ATTACKS   hydrOXYzine (VISTARIL) 50 MG capsule Take 50 mg by mouth daily as needed.   ibuprofen (ADVIL,MOTRIN) 800 MG tablet Take 1 tablet (800 mg total) by mouth every 8 (eight) hours as needed for moderate pain.   Insulin Pen Needle (PEN NEEDLES) 30G X 5 MM MISC To use daily with saxenda injectoins   lamoTRIgine  (LAMICTAL) 200 MG tablet Take 0.5 tablets (100 mg total) by mouth 2 (two) times daily.   Olopatadine HCl 0.2 % SOLN Apply 1 drop to eye daily at 6 (six) AM.   prazosin (MINIPRESS) 1 MG capsule TAKE 1 CAPSULE (1 MG TOTAL) BY MOUTH AT BEDTIME FOR NIGHTMARES.   vitamin B-12 (CYANOCOBALAMIN) 1000 MCG tablet Take 1,000 mcg by mouth daily as needed (energy). Reported on 05/08/2015   zinc gluconate 50 MG tablet Take 50 mg by mouth daily.   [DISCONTINUED] Liraglutide -Weight Management (SAXENDA) 18 MG/3ML SOPN Inject 0.6 mg [place] once daily x 1 wk. Then increase dose by 0.6 mg/day every 7 days to target of 3 mg/day.   No facility-administered medications prior to visit.    Review of Systems  Constitutional:  Negative for activity change, appetite change, chills, fatigue and fever.       Three pound weight loss since last visit   HENT:  Negative for congestion, postnasal drip, rhinorrhea, sinus pressure, sinus pain, sneezing and sore throat.   Eyes: Negative.   Respiratory:  Negative for cough, chest tightness, shortness of breath and wheezing.   Cardiovascular:  Negative for chest pain and palpitations.  Gastrointestinal:  Negative for abdominal pain, constipation, diarrhea, nausea and vomiting.  Endocrine: Negative for cold intolerance, heat intolerance, polydipsia and polyuria.  Genitourinary:  Negative for dyspareunia,  dysuria, flank pain, frequency and urgency.  Musculoskeletal:  Negative for arthralgias, back pain and myalgias.  Skin:  Negative for rash.  Allergic/Immunologic: Negative for environmental allergies.  Neurological:  Negative for dizziness, weakness and headaches.  Hematological:  Negative for adenopathy.  Psychiatric/Behavioral:  Positive for dysphoric mood. The patient is nervous/anxious.        Patient sees psychiatry on regular basis     Last CBC Lab Results  Component Value Date   WBC 5.0 03/11/2021   HGB 13.2 03/11/2021   HCT 39.4 03/11/2021   MCV 87 03/11/2021   MCH  29.0 03/11/2021   RDW 12.2 03/11/2021   PLT 241 37/04/8887   Last metabolic panel Lab Results  Component Value Date   GLUCOSE 97 03/11/2021   NA 138 03/11/2021   K 4.6 03/11/2021   CL 104 03/11/2021   CO2 21 03/11/2021   BUN 11 03/11/2021   CREATININE 0.79 03/11/2021   EGFR 92 03/11/2021   CALCIUM 9.4 03/11/2021   PROT 7.0 03/11/2021   ALBUMIN 4.4 03/11/2021   LABGLOB 2.6 03/11/2021   AGRATIO 1.7 03/11/2021   BILITOT 0.7 03/11/2021   ALKPHOS 55 03/11/2021   AST 16 03/11/2021   ALT 12 03/11/2021   ANIONGAP 6 04/22/2020   Last lipids Lab Results  Component Value Date   CHOL 147 03/11/2021   HDL 42 03/11/2021   LDLCALC 93 03/11/2021   TRIG 58 03/11/2021   CHOLHDL 3.5 03/11/2021   Last hemoglobin A1c Lab Results  Component Value Date   HGBA1C 5.1 03/11/2021   Last thyroid functions Lab Results  Component Value Date   TSH 2.800 03/11/2021   Last vitamin D Lab Results  Component Value Date   VD25OH 27.01 (L) 08/31/2019       Objective     Today's Vitals   07/08/21 1059  BP: 110/70  Pulse: 69  Temp: 97.9 F (36.6 C)  SpO2: 99%  Weight: 228 lb 12.8 oz (103.8 kg)  Height: 5' 4.17" (1.63 m)   Body mass index is 39.06 kg/m.   BP Readings from Last 3 Encounters:  07/08/21 110/70  05/08/21 99/67  03/11/21 108/73    Wt Readings from Last 3 Encounters:  07/08/21 228 lb 12.8 oz (103.8 kg)  05/08/21 231 lb 12.8 oz (105.1 kg)  03/11/21 236 lb 3.2 oz (107.1 kg)    Physical Exam Vitals and nursing note reviewed.  Constitutional:      Appearance: Normal appearance. She is well-developed. She is obese.  HENT:     Head: Normocephalic and atraumatic.  Eyes:     Pupils: Pupils are equal, round, and reactive to light.  Cardiovascular:     Rate and Rhythm: Normal rate and regular rhythm.     Pulses: Normal pulses.     Heart sounds: Normal heart sounds.  Pulmonary:     Effort: Pulmonary effort is normal.     Breath sounds: Normal breath sounds.   Abdominal:     Palpations: Abdomen is soft.  Musculoskeletal:        General: Normal range of motion.     Cervical back: Normal range of motion and neck supple.  Lymphadenopathy:     Cervical: No cervical adenopathy.  Skin:    General: Skin is warm and dry.     Capillary Refill: Capillary refill takes less than 2 seconds.  Neurological:     General: No focal deficit present.     Mental Status: She is alert and oriented to  person, place, and time.  Psychiatric:        Mood and Affect: Mood normal.        Behavior: Behavior normal.        Thought Content: Thought content normal.        Judgment: Judgment normal.       Assessment & Plan    1. Chronic allergic rhinitis Patient should continue over-the-counter allergy medication and nasal sprays as previously recommended.  2. Body mass index (BMI) of 39.0-39.9 in adult Change over her Saxenda to [facility] 0.25 mg weekly.  She should continue with low daily calorie intake and incorporating exercise into daily routine to help lose weight.  Reassess in 2 months.  3. History of TIA (transient ischemic attack) Stable.  4. Schizoaffective disorder, bipolar type [facility]) She should continue with regular visits to psychiatry as scheduled.  Problem List Items Addressed This Visit       Respiratory   Chronic allergic rhinitis - Primary     Other   Schizoaffective disorder, bipolar type ([place])   Body mass index (BMI) of 39.0-39.9 in adult   History of TIA (transient ischemic attack)     Return in about 2 months (around 09/07/2021) for routine - weight management.         Ronnell Freshwater, NP  [facility] at [facility] [phone] (phone) [phone] (fax)  [place]

## 2021-07-08 ENCOUNTER — Ambulatory Visit (INDEPENDENT_AMBULATORY_CARE_PROVIDER_SITE_OTHER): Payer: BC Managed Care – PPO | Admitting: Nurse Practitioner

## 2021-07-08 ENCOUNTER — Encounter: Payer: Self-pay | Admitting: Nurse Practitioner

## 2021-07-08 VITALS — BP 110/70 | HR 69 | Temp 97.9°F | Ht 64.17 in | Wt 228.8 lb

## 2021-07-08 DIAGNOSIS — Z8673 Personal history of transient ischemic attack (TIA), and cerebral infarction without residual deficits: Secondary | ICD-10-CM | POA: Diagnosis not present

## 2021-07-08 DIAGNOSIS — Z6839 Body mass index (BMI) 39.0-39.9, adult: Secondary | ICD-10-CM | POA: Diagnosis not present

## 2021-07-08 DIAGNOSIS — F250 Schizoaffective disorder, bipolar type: Secondary | ICD-10-CM

## 2021-07-08 DIAGNOSIS — J309 Allergic rhinitis, unspecified: Secondary | ICD-10-CM

## 2021-07-08 MED ORDER — OZEMPIC (0.25 OR 0.5 MG/DOSE) 2 MG/3ML ~~LOC~~ SOPN: 0.2500 mg | PEN_INJECTOR | SUBCUTANEOUS | 1 refills | Status: DC

## 2021-07-13 ENCOUNTER — Other Ambulatory Visit: Payer: Self-pay | Admitting: Nurse Practitioner

## 2021-07-13 DIAGNOSIS — F3181 Bipolar II disorder: Secondary | ICD-10-CM | POA: Diagnosis not present

## 2021-07-13 DIAGNOSIS — F4310 Post-traumatic stress disorder, unspecified: Secondary | ICD-10-CM | POA: Diagnosis not present

## 2021-07-13 DIAGNOSIS — Z6839 Body mass index (BMI) 39.0-39.9, adult: Secondary | ICD-10-CM

## 2021-07-13 DIAGNOSIS — Z8673 Personal history of transient ischemic attack (TIA), and cerebral infarction without residual deficits: Secondary | ICD-10-CM

## 2021-07-14 ENCOUNTER — Other Ambulatory Visit: Payer: Self-pay | Admitting: Nurse Practitioner

## 2021-07-14 DIAGNOSIS — Z8673 Personal history of transient ischemic attack (TIA), and cerebral infarction without residual deficits: Secondary | ICD-10-CM

## 2021-07-14 DIAGNOSIS — Z6841 Body Mass Index (BMI) 40.0 and over, adult: Secondary | ICD-10-CM

## 2021-07-14 MED ORDER — WEGOVY 0.25 MG/0.5ML ~~LOC~~ SOAJ: 0.2500 mg | SUBCUTANEOUS | 1 refills | Status: DC

## 2021-07-14 NOTE — Telephone Encounter (Signed)
Called pt she is advised of the PA pt stated she is waited to here back from the [facility]

## 2021-07-18 DIAGNOSIS — Z8673 Personal history of transient ischemic attack (TIA), and cerebral infarction without residual deficits: Secondary | ICD-10-CM | POA: Insufficient documentation

## 2021-07-18 DIAGNOSIS — J309 Allergic rhinitis, unspecified: Secondary | ICD-10-CM | POA: Insufficient documentation

## 2021-07-22 DIAGNOSIS — F5105 Insomnia due to other mental disorder: Secondary | ICD-10-CM | POA: Diagnosis not present

## 2021-07-22 DIAGNOSIS — F411 Generalized anxiety disorder: Secondary | ICD-10-CM | POA: Diagnosis not present

## 2021-07-22 DIAGNOSIS — F3175 Bipolar disorder, in partial remission, most recent episode depressed: Secondary | ICD-10-CM | POA: Diagnosis not present

## 2021-07-22 DIAGNOSIS — F4312 Post-traumatic stress disorder, chronic: Secondary | ICD-10-CM | POA: Diagnosis not present

## 2021-08-05 DIAGNOSIS — F410 Panic disorder [episodic paroxysmal anxiety] without agoraphobia: Secondary | ICD-10-CM | POA: Diagnosis not present

## 2021-08-05 DIAGNOSIS — F250 Schizoaffective disorder, bipolar type: Secondary | ICD-10-CM | POA: Diagnosis not present

## 2021-08-05 DIAGNOSIS — F3132 Bipolar disorder, current episode depressed, moderate: Secondary | ICD-10-CM | POA: Diagnosis not present

## 2021-08-05 DIAGNOSIS — F4312 Post-traumatic stress disorder, chronic: Secondary | ICD-10-CM | POA: Diagnosis not present

## 2021-08-18 DIAGNOSIS — F4310 Post-traumatic stress disorder, unspecified: Secondary | ICD-10-CM | POA: Diagnosis not present

## 2021-08-18 DIAGNOSIS — F3181 Bipolar II disorder: Secondary | ICD-10-CM | POA: Diagnosis not present

## 2021-09-04 ENCOUNTER — Other Ambulatory Visit: Payer: Self-pay | Admitting: Nurse Practitioner

## 2021-09-04 DIAGNOSIS — F410 Panic disorder [episodic paroxysmal anxiety] without agoraphobia: Secondary | ICD-10-CM

## 2021-09-04 DIAGNOSIS — F5105 Insomnia due to other mental disorder: Secondary | ICD-10-CM

## 2021-10-13 ENCOUNTER — Encounter: Payer: Self-pay | Admitting: Nurse Practitioner

## 2021-10-15 ENCOUNTER — Other Ambulatory Visit: Payer: Self-pay | Admitting: Nurse Practitioner

## 2021-10-15 DIAGNOSIS — Z6841 Body Mass Index (BMI) 40.0 and over, adult: Secondary | ICD-10-CM

## 2021-10-15 MED ORDER — WEGOVY 1 MG/0.5ML ~~LOC~~ SOAJ: 1.0000 mg | SUBCUTANEOUS | 1 refills | Status: DC

## 2021-10-15 NOTE — Progress Notes (Signed)
Changed dose wegovy to 1 mg weekly. [facility] [place].

## 2021-11-10 DIAGNOSIS — F410 Panic disorder [episodic paroxysmal anxiety] without agoraphobia: Secondary | ICD-10-CM | POA: Diagnosis not present

## 2021-11-10 DIAGNOSIS — F4312 Post-traumatic stress disorder, chronic: Secondary | ICD-10-CM | POA: Diagnosis not present

## 2021-11-10 DIAGNOSIS — F3132 Bipolar disorder, current episode depressed, moderate: Secondary | ICD-10-CM | POA: Diagnosis not present

## 2021-11-10 DIAGNOSIS — F250 Schizoaffective disorder, bipolar type: Secondary | ICD-10-CM | POA: Diagnosis not present

## 2021-11-24 DIAGNOSIS — F4310 Post-traumatic stress disorder, unspecified: Secondary | ICD-10-CM | POA: Diagnosis not present

## 2021-11-24 DIAGNOSIS — F3181 Bipolar II disorder: Secondary | ICD-10-CM | POA: Diagnosis not present

## 2021-12-01 ENCOUNTER — Encounter: Payer: Self-pay | Admitting: Internal Medicine

## 2021-12-08 DIAGNOSIS — F4312 Post-traumatic stress disorder, chronic: Secondary | ICD-10-CM | POA: Diagnosis not present

## 2021-12-08 DIAGNOSIS — F3132 Bipolar disorder, current episode depressed, moderate: Secondary | ICD-10-CM | POA: Diagnosis not present

## 2021-12-08 DIAGNOSIS — F250 Schizoaffective disorder, bipolar type: Secondary | ICD-10-CM | POA: Diagnosis not present

## 2021-12-08 DIAGNOSIS — F410 Panic disorder [episodic paroxysmal anxiety] without agoraphobia: Secondary | ICD-10-CM | POA: Diagnosis not present

## 2021-12-23 ENCOUNTER — Other Ambulatory Visit: Payer: Self-pay | Admitting: Nurse Practitioner

## 2021-12-23 ENCOUNTER — Telehealth: Payer: Self-pay | Admitting: *Deleted

## 2021-12-23 DIAGNOSIS — Z6841 Body Mass Index (BMI) 40.0 and over, adult: Secondary | ICD-10-CM

## 2021-12-23 MED ORDER — SEMAGLUTIDE-WEIGHT MANAGEMENT 1.7 MG/0.75ML ~~LOC~~ SOAJ: 1.7000 mg | SUBCUTANEOUS | 2 refills | Status: DC

## 2021-12-23 NOTE — Telephone Encounter (Signed)
Patient has been notified of dosage increase and office visit requirement. Patient verbalized understanding. All questions and concerns have been addressed.

## 2021-12-23 NOTE — Telephone Encounter (Signed)
In increased her wegovy to 1.7 mg weekly. [facility]. She will need follow up for further evaluation.

## 2021-12-23 NOTE — Telephone Encounter (Signed)
Pt is calling requesting a refill on her Wegovy.  She states that she thinks her body is used to the current 1.0 mg and she would like to increase to the next dose which is 1.7 mg.  Please send new refill to the below pharmacy. Joshual Terrio Lamonte Sakai, CMA   [id number] [place] [place] - [address] [place] [address] Phone: [phone]  Fax: [phone]

## 2021-12-30 ENCOUNTER — Encounter: Payer: Self-pay | Admitting: Nurse Practitioner

## 2022-01-09 ENCOUNTER — Other Ambulatory Visit: Payer: Self-pay | Admitting: Nurse Practitioner

## 2022-01-09 DIAGNOSIS — Z6841 Body Mass Index (BMI) 40.0 and over, adult: Secondary | ICD-10-CM

## 2022-02-10 ENCOUNTER — Other Ambulatory Visit: Payer: Self-pay | Admitting: *Deleted

## 2022-02-10 DIAGNOSIS — E611 Iron deficiency: Secondary | ICD-10-CM

## 2022-02-12 ENCOUNTER — Inpatient Hospital Stay: Payer: BC Managed Care – PPO | Attending: Internal Medicine | Admitting: Nurse Practitioner

## 2022-02-12 ENCOUNTER — Encounter: Payer: Self-pay | Admitting: Nurse Practitioner

## 2022-02-12 ENCOUNTER — Inpatient Hospital Stay: Payer: BC Managed Care – PPO

## 2022-02-12 VITALS — BP 120/71 | HR 72 | Temp 97.2°F | Wt 218.0 lb

## 2022-02-12 DIAGNOSIS — R5383 Other fatigue: Secondary | ICD-10-CM | POA: Diagnosis not present

## 2022-02-12 DIAGNOSIS — Z8673 Personal history of transient ischemic attack (TIA), and cerebral infarction without residual deficits: Secondary | ICD-10-CM | POA: Diagnosis not present

## 2022-02-12 DIAGNOSIS — N920 Excessive and frequent menstruation with regular cycle: Secondary | ICD-10-CM | POA: Insufficient documentation

## 2022-02-12 DIAGNOSIS — D500 Iron deficiency anemia secondary to blood loss (chronic): Secondary | ICD-10-CM

## 2022-02-12 DIAGNOSIS — Z87891 Personal history of nicotine dependence: Secondary | ICD-10-CM | POA: Insufficient documentation

## 2022-02-12 DIAGNOSIS — D509 Iron deficiency anemia, unspecified: Secondary | ICD-10-CM | POA: Diagnosis not present

## 2022-02-12 DIAGNOSIS — E611 Iron deficiency: Secondary | ICD-10-CM

## 2022-02-12 LAB — BASIC METABOLIC PANEL
Anion gap: 7 (ref 5–15)
BUN: 14 mg/dL (ref 6–20)
CO2: 22 mmol/L (ref 22–32)
Calcium: 9.2 mg/dL (ref 8.9–10.3)
Chloride: 106 mmol/L (ref 98–111)
Creatinine, Ser: 0.95 mg/dL (ref 0.44–1.00)
GFR, Estimated: >60 mL/min (ref >60)
Glucose, Bld: 91 mg/dL (ref 70–99)
Potassium: 4.1 mmol/L (ref 3.5–5.1)
Sodium: 135 mmol/L (ref 135–145)

## 2022-02-12 LAB — CBC WITH DIFFERENTIAL/PLATELET
Abs Immature Granulocytes: 0.01 10*3/uL (ref 0.00–0.07)
Basophils Absolute: 0 10*3/uL (ref 0.0–0.1)
Basophils Relative: 1 %
Eosinophils Absolute: 0.1 10*3/uL (ref 0.0–0.5)
Eosinophils Relative: 3 %
HCT: 33.9 % — ABNORMAL LOW (ref 36.0–46.0)
Hemoglobin: 11 g/dL — ABNORMAL LOW (ref 12.0–15.0)
Immature Granulocytes: 0 %
Lymphocytes Relative: 27 %
Lymphs Abs: 1.1 10*3/uL (ref 0.7–4.0)
MCH: 28.4 pg (ref 26.0–34.0)
MCHC: 32.4 g/dL (ref 30.0–36.0)
MCV: 87.6 fL (ref 80.0–100.0)
Monocytes Absolute: 0.3 10*3/uL (ref 0.1–1.0)
Monocytes Relative: 8 %
Neutro Abs: 2.4 10*3/uL (ref 1.7–7.7)
Neutrophils Relative %: 61 %
Platelets: 247 10*3/uL (ref 150–400)
RBC: 3.87 MIL/uL (ref 3.87–5.11)
RDW: 13.1 % (ref 11.5–15.5)
WBC: 3.9 10*3/uL — ABNORMAL LOW (ref 4.0–10.5)
nRBC: 0 % (ref 0.0–0.2)

## 2022-02-12 LAB — IRON AND TIBC
Iron: 175 ug/dL — ABNORMAL HIGH (ref 28–170)
Saturation Ratios: 45 % — ABNORMAL HIGH (ref 10.4–31.8)
TIBC: 388 ug/dL (ref 250–450)
UIBC: 213 ug/dL

## 2022-02-12 LAB — FERRITIN: Ferritin: 15 ng/mL (ref 11–307)

## 2022-02-12 MED ORDER — SODIUM CHLORIDE 0.9 % IV SOLN
200.0000 mg | Freq: Once | INTRAVENOUS | Status: AC
  Administered 2022-02-12: 200 mg via INTRAVENOUS
  Filled 2022-02-12: qty 10

## 2022-02-12 MED ORDER — SODIUM CHLORIDE 0.9 % IV SOLN
Freq: Once | INTRAVENOUS | Status: AC
  Filled 2022-02-12: qty 250

## 2022-02-12 NOTE — Progress Notes (Signed)
[facility] CONSULT NOTE  Patient Care Team: Carlean Jews, NP as PCP - General (Family Medicine)  CHIEF COMPLAINTS/PURPOSE OF CONSULTATION: Anemia.  HEMATOLOGY HISTORY  # ANEMIA colonoscopy-2016 [Dr.Wohl]; FERRITIN -9 [LOW; [facility]]-hemoglobin 11.3 s/p IV iron infusion-no improvement of symptoms[fatigue and dizziness]  # ? Seizure [ Neurology; [facility]; [facility]]; history of stroke/bipolar/PTSD [followed by psychiatry]; heavy menstrual cycles.   HISTORY OF PRESENTING ILLNESS:  Brittney Duran 52 y.o.  female with low iron levels/mild anemia is here for follow-up and consideration of venofer. She feels very tired. Falling asleep when driving d/t fatigue. Tired all the time. No shortness of breath or cough. Has heavy periods and has seen her gyn for this. No black or bloody stools.   Review of Systems  Constitutional:  Positive for malaise/fatigue. Negative for chills, diaphoresis, fever and weight loss.  HENT:  Negative for nosebleeds and sore throat.   Eyes:  Negative for double vision.  Respiratory:  Negative for cough, hemoptysis, sputum production, shortness of breath and wheezing.   Cardiovascular:  Negative for chest pain, palpitations, orthopnea and leg swelling.  Gastrointestinal:  Negative for abdominal pain, blood in stool, constipation, diarrhea, heartburn, melena, nausea and vomiting.  Genitourinary:  Negative for flank pain and hematuria.  Musculoskeletal:  Negative for back pain and joint pain.  Skin: Negative.  Negative for itching and rash.  Neurological:  Negative for dizziness, tingling, focal weakness, weakness and headaches.  Endo/Heme/Allergies:  Does not bruise/bleed easily.  Psychiatric/Behavioral:  Negative for depression. The patient is not nervous/anxious and does not have insomnia.     MEDICAL HISTORY:  Past Medical History:  Diagnosis Date   Anemia    Anxiety    Panic attacks   Complication of anesthesia    pt reports "local" wears off  quickly   Depression    Environmental allergies    Family history of breast cancer    Family history of pancreatic cancer    Family history of prostate cancer    Family history of stomach cancer    Hemorrhoids    Leaky heart valve    Sciatica 09/08/2017   Stroke (HCC)    Wears contact lenses     SURGICAL HISTORY: Past Surgical History:  Procedure Laterality Date   Caesaran section  1989   COLONOSCOPY WITH PROPOFOL N/A 10/07/2014   Procedure: COLONOSCOPY WITH PROPOFOL;  Surgeon: [name], MD;  Location: [facility];  Service: Endoscopy;  Laterality: N/A;  Latex   HERNIA REPAIR  1978    SOCIAL HISTORY: Social History   Socioeconomic History   Marital status: Married    Spouse name: duglaus Hufstedler   Number of children: 5   Years of education: 16   Highest education level: Some college, no degree  Occupational History   Occupation: [employer]  Tobacco Use   Smoking status: Former    Years: 15.00    Types: Cigarettes    Quit date: 1997    Years since quitting: 27.0   Smokeless tobacco: Never   Tobacco comments:    stopped at age 46 yro  Vaping Use   Vaping Use: Never used  Substance and Sexual Activity   Alcohol use: Yes    Alcohol/week: 0.0 standard drinks of alcohol    Comment: drinks red wine 2-3 times a month   Drug use: No   Sexual activity: Yes    Birth control/protection: None    Comment: Paragard  Other Topics Concern   Not on file  Social History Narrative   Lives at home w/ her husband and children; Left-handed; Drinks 1 cup of coffee per day. Lives in [place]; used to work for [employer]/ awaiting for disability [sec to stroke]; no smoking; rare alcohol/wine.    Social Determinants of Health   Financial Resource Strain: Low Risk  (04/27/2018)   Overall Financial Resource Strain (CARDIA)    Difficulty of Paying Living Expenses: Not very hard  Food Insecurity: Food Insecurity Present (04/27/2018)   Hunger Vital Sign    Worried About Running Out of  Food in the Last Year: Sometimes true    Ran Out of Food in the Last Year: Sometimes true  Transportation Needs: No Transportation Needs (04/27/2018)   PRAPARE - Hydrologist (Medical): No    Lack of Transportation (Non-Medical): No  Physical Activity: Inactive (04/27/2018)   Exercise Vital Sign    Days of Exercise per Week: 0 days    Minutes of Exercise per Session: 0 min  Stress: Stress Concern Present (04/27/2018)   [place]    Feeling of Stress : Rather much  Social Connections: Unknown (04/27/2018)   Social Connection and Isolation Panel [NHANES]    Frequency of Communication with Friends and Family: Not on file    Frequency of Social Gatherings with Friends and Family: Not on file    Attends Religious Services: More than 4 times per year    Active Member of [employer] or Organizations: Yes    Attends Archivist Meetings: More than 4 times per year    Marital Status: Married  Human resources officer Violence: Not At Risk (04/27/2018)   Humiliation, Afraid, Rape, and Kick questionnaire    Fear of Current or Ex-Partner: No    Emotionally Abused: No    Physically Abused: No    Sexually Abused: No    FAMILY HISTORY: Family History  Problem Relation Age of Onset   Hypertension Mother    Seizures Brother    Stomach cancer Maternal Aunt 45   Breast cancer Paternal Aunt        dx 2s   Pancreatic cancer Paternal Aunt 57   Prostate cancer Paternal Grandfather        dx 50s   Kidney failure Brother    Pancreatic cancer Other    Brain cancer Maternal Great-grandmother    Breast cancer Cousin        dx 24s    ALLERGIES:  is allergic to lithium, other, penicillins, shellfish allergy, and latex.  MEDICATIONS:  Current Outpatient Medications  Medication Sig Dispense Refill   ARIPiprazole (ABILIFY) 20 MG tablet Take 1 tablet (20 mg total) by mouth daily. 90 tablet 0   Asenapine Maleate 10 MG  SUBL Place under the tongue.     aspirin 81 MG tablet Take 162 mg by mouth daily.     buPROPion (WELLBUTRIN SR) 150 MG 12 hr tablet Take by mouth every morning.     buPROPion (WELLBUTRIN XL) 150 MG 24 hr tablet TAKE 1 TABLET BY MOUTH EVERY DAY 90 tablet 0   cetirizine-pseudoephedrine (ZYRTEC-D) 5-120 MG per tablet Take 1 tablet by mouth 2 (two) times daily as needed for allergies.     Cholecalciferol (VITAMIN D3) 5000 units CAPS Take 1 capsule by mouth daily.     Eszopiclone 3 MG TABS Take 1 tablet (3 mg total) by mouth at bedtime. Take immediately before bedtime 30 tablet 1   ferrous sulfate 325 (  65 FE) MG tablet Take 325 mg by mouth daily with breakfast.     fluticasone (FLONASE) 50 MCG/ACT nasal spray Place 2 sprays into both nostrils daily. 16 g 6   hydrOXYzine (VISTARIL) 25 MG capsule TAKE 1 CAPSULE (25 MG TOTAL) BY MOUTH 2 (TWO) TIMES DAILY AS NEEDED. FOR ANXIETY ATTACKS 180 capsule 0   hydrOXYzine (VISTARIL) 50 MG capsule Take 50 mg by mouth daily as needed.     ibuprofen (ADVIL,MOTRIN) 800 MG tablet Take 1 tablet (800 mg total) by mouth every 8 (eight) hours as needed for moderate pain. 15 tablet 0   Insulin Pen Needle (PEN NEEDLES) 30G X 5 MM MISC To use daily with saxenda injectoins 100 each 1   lamoTRIgine (LAMICTAL) 200 MG tablet Take 0.5 tablets (100 mg total) by mouth 2 (two) times daily. 90 tablet 0   Olopatadine HCl 0.2 % SOLN Apply 1 drop to eye daily at 6 (six) AM. 2.5 mL 3   prazosin (MINIPRESS) 1 MG capsule TAKE 1 CAPSULE (1 MG TOTAL) BY MOUTH AT BEDTIME FOR NIGHTMARES. 90 capsule 0   Semaglutide-Weight Management 1.7 MG/0.75ML SOAJ Inject 1.7 mg into the skin once a week. 3 mL 2   vitamin B-12 (CYANOCOBALAMIN) 1000 MCG tablet Take 1,000 mcg by mouth daily as needed (energy). Reported on 05/08/2015     zinc gluconate 50 MG tablet Take 50 mg by mouth daily.     No current facility-administered medications for this visit.     PHYSICAL EXAMINATION: Vitals:   02/12/22 1321   BP: 120/71  Pulse: 72  Temp: (!) 97.2 F (36.2 C)   Filed Weights   02/12/22 1321  Weight: 218 lb (98.9 kg)   Physical Exam Constitutional:      Appearance: She is not ill-appearing.  Cardiovascular:     Rate and Rhythm: Normal rate and regular rhythm.  Pulmonary:     Effort: No respiratory distress.  Abdominal:     General: There is no distension.     Palpations: Abdomen is soft.     Tenderness: There is no abdominal tenderness. There is no guarding.  Musculoskeletal:        General: No deformity.     Right lower leg: No edema.     Left lower leg: No edema.  Skin:    General: Skin is warm and dry.  Neurological:     Mental Status: She is alert and oriented to person, place, and time. Mental status is at baseline.  Psychiatric:        Mood and Affect: Mood normal.        Behavior: Behavior normal.     LABORATORY DATA:  I have reviewed the data as listed Lab Results  Component Value Date   WBC 3.9 (L) 02/12/2022   HGB 11.0 (L) 02/12/2022   HCT 33.9 (L) 02/12/2022   MCV 87.6 02/12/2022   PLT 247 02/12/2022   Recent Labs    03/11/21 1018 02/12/22 1305  NA 138 135  K 4.6 4.1  CL 104 106  CO2 21 22  GLUCOSE 97 91  BUN 11 14  CREATININE 0.79 0.95  CALCIUM 9.4 9.2  GFRNONAA  --  >60  PROT 7.0  --   ALBUMIN 4.4  --   AST 16  --   ALT 12  --   ALKPHOS 55  --   BILITOT 0.7  --    Iron/TIBC/Ferritin/ %Sat    Component Value Date/Time   IRON 123 04/22/2020  1406   TIBC 335 04/22/2020 1406   FERRITIN 78 04/22/2020 1406   IRONPCTSAT 37 (H) 04/22/2020 1406   No results found.   Assessment & Plan:  No problem-specific Assessment & Plan notes found for this encounter.  Iron Deficiency Anemia- likely secondary to menorrhagia. Colonoscopy 2016- nonbleeding grade 1 hemorrhoids, otherwise normal. Never had EGD or capsule. Hemoglobin has dropped again to 11. Ferritin and iron studies pending at time of visit. Symptomatic- fatigued. Plan for venofer today.  Additional doses based on ferritin and iron studies. Last IV iron April 2022. Tolerates venofer well. Plan for venofer 200 mg IV today. Check urine pregnancy monthly if receiving iron.  Fatigue- likely multifactorial (anemia & psychiatric medications). Also recommend evaluation with pcp for additional workup if symptoms don't improve with correction of anemia/iron levels.  Family history of cancer- s/p evaluation with Faith Rogue. Multi-Cancer Panel+RNA was negative  Menorrhagia- previously had paragard IUD. Declines hormonal txs d/t hx of stroke. Could consider mirena (lower risk hormones) vs ablation. Was managed by Ardeth Perfect, last visit in 2021. Recommend follow up annually.   Disposition:  Venofer today 6 mo- lab (cbc, cmp, ferritin, iron studies) Few days to week later- virtual or in person visit with Dr. Rogue Bussing. +/- venofer if in person- la  All questions were answered. The patient knows to call the clinic with any problems, questions or concerns.   Verlon Au, NP 02/12/2022 1:55 PM

## 2022-02-23 DIAGNOSIS — F3181 Bipolar II disorder: Secondary | ICD-10-CM | POA: Diagnosis not present

## 2022-02-23 DIAGNOSIS — F4310 Post-traumatic stress disorder, unspecified: Secondary | ICD-10-CM | POA: Diagnosis not present

## 2022-02-26 DIAGNOSIS — F410 Panic disorder [episodic paroxysmal anxiety] without agoraphobia: Secondary | ICD-10-CM | POA: Diagnosis not present

## 2022-02-26 DIAGNOSIS — F3132 Bipolar disorder, current episode depressed, moderate: Secondary | ICD-10-CM | POA: Diagnosis not present

## 2022-02-26 DIAGNOSIS — F4312 Post-traumatic stress disorder, chronic: Secondary | ICD-10-CM | POA: Diagnosis not present

## 2022-02-26 DIAGNOSIS — F250 Schizoaffective disorder, bipolar type: Secondary | ICD-10-CM | POA: Diagnosis not present

## 2022-03-03 ENCOUNTER — Other Ambulatory Visit: Payer: Self-pay | Admitting: *Deleted

## 2022-03-03 DIAGNOSIS — D500 Iron deficiency anemia secondary to blood loss (chronic): Secondary | ICD-10-CM

## 2022-03-04 ENCOUNTER — Inpatient Hospital Stay: Payer: BC Managed Care – PPO | Attending: Internal Medicine

## 2022-03-04 DIAGNOSIS — N920 Excessive and frequent menstruation with regular cycle: Secondary | ICD-10-CM | POA: Insufficient documentation

## 2022-03-04 DIAGNOSIS — D509 Iron deficiency anemia, unspecified: Secondary | ICD-10-CM | POA: Insufficient documentation

## 2022-03-04 DIAGNOSIS — R5383 Other fatigue: Secondary | ICD-10-CM | POA: Insufficient documentation

## 2022-03-08 ENCOUNTER — Telehealth: Payer: Self-pay | Admitting: *Deleted

## 2022-03-08 ENCOUNTER — Other Ambulatory Visit: Payer: Self-pay | Admitting: Nurse Practitioner

## 2022-03-08 ENCOUNTER — Inpatient Hospital Stay: Payer: BC Managed Care – PPO

## 2022-03-08 DIAGNOSIS — Z6841 Body Mass Index (BMI) 40.0 and over, adult: Secondary | ICD-10-CM

## 2022-03-08 DIAGNOSIS — R5383 Other fatigue: Secondary | ICD-10-CM | POA: Diagnosis not present

## 2022-03-08 DIAGNOSIS — D509 Iron deficiency anemia, unspecified: Secondary | ICD-10-CM | POA: Diagnosis not present

## 2022-03-08 DIAGNOSIS — N920 Excessive and frequent menstruation with regular cycle: Secondary | ICD-10-CM | POA: Diagnosis not present

## 2022-03-08 DIAGNOSIS — D500 Iron deficiency anemia secondary to blood loss (chronic): Secondary | ICD-10-CM

## 2022-03-08 LAB — CBC WITH DIFFERENTIAL (CANCER CENTER ONLY)
Abs Immature Granulocytes: 0.01 10*3/uL (ref 0.00–0.07)
Basophils Absolute: 0 10*3/uL (ref 0.0–0.1)
Basophils Relative: 1 %
Eosinophils Absolute: 0.1 10*3/uL (ref 0.0–0.5)
Eosinophils Relative: 3 %
HCT: 36.4 % (ref 36.0–46.0)
Hemoglobin: 11.4 g/dL — ABNORMAL LOW (ref 12.0–15.0)
Immature Granulocytes: 0 %
Lymphocytes Relative: 30 %
Lymphs Abs: 1.3 10*3/uL (ref 0.7–4.0)
MCH: 28.2 pg (ref 26.0–34.0)
MCHC: 31.3 g/dL (ref 30.0–36.0)
MCV: 90.1 fL (ref 80.0–100.0)
Monocytes Absolute: 0.3 10*3/uL (ref 0.1–1.0)
Monocytes Relative: 7 %
Neutro Abs: 2.5 10*3/uL (ref 1.7–7.7)
Neutrophils Relative %: 59 %
Platelet Count: 236 10*3/uL (ref 150–400)
RBC: 4.04 MIL/uL (ref 3.87–5.11)
RDW: 13.6 % (ref 11.5–15.5)
WBC Count: 4.3 10*3/uL (ref 4.0–10.5)
nRBC: 0 % (ref 0.0–0.2)

## 2022-03-08 LAB — IRON AND TIBC
Iron: 76 ug/dL (ref 28–170)
Saturation Ratios: 23 % (ref 10.4–31.8)
TIBC: 332 ug/dL (ref 250–450)
UIBC: 256 ug/dL

## 2022-03-08 LAB — FERRITIN: Ferritin: 12 ng/mL (ref 11–307)

## 2022-03-08 MED ORDER — SEMAGLUTIDE-WEIGHT MANAGEMENT 1.7 MG/0.75ML ~~LOC~~ SOAJ: 1.7000 mg | SUBCUTANEOUS | 1 refills | Status: AC

## 2022-03-08 NOTE — Telephone Encounter (Signed)
Pt calling to schedule an appointment and stated she would need a refill on her medication before the appointment.        Semaglutide-Weight Management 1.7 MG/0.75ML SOAJ    [facility] [place] [place]   Lov:07/08/21 Rov. 04/14/22

## 2022-03-08 NOTE — Telephone Encounter (Signed)
[facility]

## 2022-03-09 ENCOUNTER — Telehealth: Payer: Self-pay | Admitting: *Deleted

## 2022-03-09 NOTE — Telephone Encounter (Signed)
LVM to call office back to inform her that her Rx was sent in.  Will also send a mychart message to let her know as well.

## 2022-03-17 DIAGNOSIS — F250 Schizoaffective disorder, bipolar type: Secondary | ICD-10-CM | POA: Diagnosis not present

## 2022-03-17 DIAGNOSIS — F4312 Post-traumatic stress disorder, chronic: Secondary | ICD-10-CM | POA: Diagnosis not present

## 2022-03-17 DIAGNOSIS — F3132 Bipolar disorder, current episode depressed, moderate: Secondary | ICD-10-CM | POA: Diagnosis not present

## 2022-03-17 DIAGNOSIS — F410 Panic disorder [episodic paroxysmal anxiety] without agoraphobia: Secondary | ICD-10-CM | POA: Diagnosis not present

## 2022-04-07 DIAGNOSIS — F3132 Bipolar disorder, current episode depressed, moderate: Secondary | ICD-10-CM | POA: Diagnosis not present

## 2022-04-07 DIAGNOSIS — F410 Panic disorder [episodic paroxysmal anxiety] without agoraphobia: Secondary | ICD-10-CM | POA: Diagnosis not present

## 2022-04-07 DIAGNOSIS — F250 Schizoaffective disorder, bipolar type: Secondary | ICD-10-CM | POA: Diagnosis not present

## 2022-04-07 DIAGNOSIS — F4312 Post-traumatic stress disorder, chronic: Secondary | ICD-10-CM | POA: Diagnosis not present

## 2022-04-14 ENCOUNTER — Ambulatory Visit: Payer: BC Managed Care – PPO | Admitting: Nurse Practitioner

## 2022-06-19 DIAGNOSIS — Z419 Encounter for procedure for purposes other than remedying health state, unspecified: Secondary | ICD-10-CM | POA: Diagnosis not present

## 2022-07-19 DIAGNOSIS — Z419 Encounter for procedure for purposes other than remedying health state, unspecified: Secondary | ICD-10-CM | POA: Diagnosis not present

## 2022-08-06 ENCOUNTER — Encounter: Payer: Self-pay | Admitting: Internal Medicine

## 2022-08-12 ENCOUNTER — Other Ambulatory Visit: Payer: Self-pay | Admitting: *Deleted

## 2022-08-12 DIAGNOSIS — D500 Iron deficiency anemia secondary to blood loss (chronic): Secondary | ICD-10-CM

## 2022-08-13 ENCOUNTER — Inpatient Hospital Stay: Payer: Medicaid Other | Attending: Internal Medicine

## 2022-08-13 DIAGNOSIS — D509 Iron deficiency anemia, unspecified: Secondary | ICD-10-CM | POA: Insufficient documentation

## 2022-08-13 DIAGNOSIS — N920 Excessive and frequent menstruation with regular cycle: Secondary | ICD-10-CM | POA: Insufficient documentation

## 2022-08-13 DIAGNOSIS — D500 Iron deficiency anemia secondary to blood loss (chronic): Secondary | ICD-10-CM

## 2022-08-13 LAB — CBC WITH DIFFERENTIAL (CANCER CENTER ONLY)
Abs Immature Granulocytes: 0.02 10*3/uL (ref 0.00–0.07)
Basophils Absolute: 0 10*3/uL (ref 0.0–0.1)
Basophils Relative: 0 %
Eosinophils Absolute: 0.1 10*3/uL (ref 0.0–0.5)
Eosinophils Relative: 2 %
HCT: 39.4 % (ref 36.0–46.0)
Hemoglobin: 12.7 g/dL (ref 12.0–15.0)
Immature Granulocytes: 0 %
Lymphocytes Relative: 26 %
Lymphs Abs: 1.5 10*3/uL (ref 0.7–4.0)
MCH: 28.2 pg (ref 26.0–34.0)
MCHC: 32.2 g/dL (ref 30.0–36.0)
MCV: 87.6 fL (ref 80.0–100.0)
Monocytes Absolute: 0.3 10*3/uL (ref 0.1–1.0)
Monocytes Relative: 6 %
Neutro Abs: 3.8 10*3/uL (ref 1.7–7.7)
Neutrophils Relative %: 66 %
Platelet Count: 206 10*3/uL (ref 150–400)
RBC: 4.5 MIL/uL (ref 3.87–5.11)
RDW: 13 % (ref 11.5–15.5)
WBC Count: 5.8 10*3/uL (ref 4.0–10.5)
nRBC: 0 % (ref 0.0–0.2)

## 2022-08-13 LAB — BASIC METABOLIC PANEL - CANCER CENTER ONLY
Anion gap: 8 (ref 5–15)
BUN: 11 mg/dL (ref 6–20)
CO2: 23 mmol/L (ref 22–32)
Calcium: 9.1 mg/dL (ref 8.9–10.3)
Chloride: 106 mmol/L (ref 98–111)
Creatinine: 0.56 mg/dL (ref 0.44–1.00)
GFR, Estimated: >60 mL/min (ref >60)
Glucose, Bld: 88 mg/dL (ref 70–99)
Potassium: 4.1 mmol/L (ref 3.5–5.1)
Sodium: 137 mmol/L (ref 135–145)

## 2022-08-13 LAB — IRON AND TIBC
Iron: 159 ug/dL (ref 28–170)
Saturation Ratios: 43 % — ABNORMAL HIGH (ref 10.4–31.8)
TIBC: 374 ug/dL (ref 250–450)
UIBC: 215 ug/dL

## 2022-08-13 LAB — FERRITIN: Ferritin: 17 ng/mL (ref 11–307)

## 2022-08-14 ENCOUNTER — Encounter: Payer: Self-pay | Admitting: Internal Medicine

## 2022-08-19 DIAGNOSIS — Z419 Encounter for procedure for purposes other than remedying health state, unspecified: Secondary | ICD-10-CM | POA: Diagnosis not present

## 2022-08-20 ENCOUNTER — Inpatient Hospital Stay: Payer: Medicaid Other

## 2022-08-20 ENCOUNTER — Inpatient Hospital Stay: Payer: Medicaid Other | Admitting: Internal Medicine

## 2022-09-10 ENCOUNTER — Encounter: Payer: Self-pay | Admitting: Medical Oncology

## 2022-09-10 ENCOUNTER — Inpatient Hospital Stay: Payer: Medicaid Other | Attending: Internal Medicine | Admitting: Medical Oncology

## 2022-09-10 ENCOUNTER — Inpatient Hospital Stay: Payer: Medicaid Other

## 2022-09-15 ENCOUNTER — Encounter: Payer: Self-pay | Admitting: Family Medicine

## 2022-09-15 ENCOUNTER — Ambulatory Visit (INDEPENDENT_AMBULATORY_CARE_PROVIDER_SITE_OTHER): Payer: Medicaid Other | Admitting: Family Medicine

## 2022-09-15 ENCOUNTER — Encounter: Payer: Self-pay | Admitting: Internal Medicine

## 2022-09-15 VITALS — BP 122/80 | HR 71 | Ht 64.17 in | Wt 227.0 lb

## 2022-09-15 DIAGNOSIS — K5904 Chronic idiopathic constipation: Secondary | ICD-10-CM | POA: Insufficient documentation

## 2022-09-15 DIAGNOSIS — N644 Mastodynia: Secondary | ICD-10-CM

## 2022-09-15 NOTE — Progress Notes (Signed)
   Acute Office Visit  Subjective:     Patient ID: Brittney Duran, female    DOB: [birth year], 52 y.o.   MRN: [id number]  Chief Complaint  Patient presents with   Breast Problem    HPI Patient presents today for evaluation of left sided breast pain, burning/itching.  Patient feels like the "inside of the breast tissue" is inflamed.  Has put a over-the-counter hemp cream on it with some improvement.  Previously had a mammogram over 5 years ago and then subsequent diagnostic mammogram for a large lump on her breast.  No biopsy was performed.  She has not had further workup of this.  Patient has not noticed any skin changes, no rash, no dimpling, no discharge from the nipple.  Patient states she has issues with constipation.  Sometimes goes a week without having a bowel movement.  No longer on Wegovy.  Not having any dark tarry stools or diarrhea.  Bowel movements are nonpainful.  She is taking collagen and probiotics but no fiber supplementation.  She had a colonoscopy in 2016.  Wants to know she should get another one soon.  Discussed that a 10-year follow-up was recommended.  Patient would like referral to the gastroenterologist at [facility]. ROS      Objective:    BP 122/80   Pulse 71   Ht 5' 4.17" (1.63 m)   Wt 227 lb (103 kg)   SpO2 98%   BMI 38.76 kg/m    Physical Exam General: Alert, oriented Pulmonary: No respiratory distress to include psych: Pleasant affect Breast: Patient declined breast exam.  No results found for any visits on 09/15/22.      Assessment & Plan:   Chronic idiopathic constipation Assessment & Plan: Patient complaining of constipation going on several months.  Was previously taking Wegovy but no longer taking this.  Bowel movements are erratic with periods of several large bowel movements followed by several days without bowel movements.  Bowel movements are generally nontender and nonbloody.  Patient had colonoscopy 8 years ago.  Patient  would like referral to [facility] gastroenterology at [facility].  Orders: -     Ambulatory referral to Gastroenterology  Breast pain, left Assessment & Plan: Patient recently developed pain/burning over her left breast.  Has a history of a left breast mass that was evaluated with mammography.  (No biopsy).  Patient denies skin changes, declined breast exam. - Referral for diagnostic mammogram, patient prefers [facility] campus where she had her previous mammogram  Orders: -     MM 3D DIAGNOSTIC MAMMOGRAM BILATERAL BREAST; Future     Return in about 2 months (around 11/15/2022) for breast pain .  Sandre Kitty, MD

## 2022-09-15 NOTE — Assessment & Plan Note (Signed)
Patient recently developed pain/burning over her left breast.  Has a history of a left breast mass that was evaluated with mammography.  (No biopsy).  Patient denies skin changes, declined breast exam. - Referral for diagnostic mammogram, patient prefers [facility] campus where she had her previous mammogram

## 2022-09-15 NOTE — Patient Instructions (Signed)
It was nice to see you today,  We addressed the following topics today: - I have sent in referrals for both the mammogram and for a gastroenterologist referral.   - if you have not heard from someone in 2 weeks let us know.   Have a great day,  Frederic Jericho, MD

## 2022-09-15 NOTE — Assessment & Plan Note (Signed)
Patient complaining of constipation going on several months.  Was previously taking Wegovy but no longer taking this.  Bowel movements are erratic with periods of several large bowel movements followed by several days without bowel movements.  Bowel movements are generally nontender and nonbloody.  Patient had colonoscopy 8 years ago.  Patient would like referral to [facility] gastroenterology at [facility].

## 2022-09-19 DIAGNOSIS — Z419 Encounter for procedure for purposes other than remedying health state, unspecified: Secondary | ICD-10-CM | POA: Diagnosis not present

## 2022-09-28 ENCOUNTER — Encounter: Payer: Self-pay | Admitting: Internal Medicine

## 2022-10-19 DIAGNOSIS — Z1231 Encounter for screening mammogram for malignant neoplasm of breast: Secondary | ICD-10-CM | POA: Diagnosis not present

## 2022-10-19 DIAGNOSIS — R300 Dysuria: Secondary | ICD-10-CM | POA: Diagnosis not present

## 2022-10-19 DIAGNOSIS — Z419 Encounter for procedure for purposes other than remedying health state, unspecified: Secondary | ICD-10-CM | POA: Diagnosis not present

## 2022-10-19 DIAGNOSIS — Z124 Encounter for screening for malignant neoplasm of cervix: Secondary | ICD-10-CM | POA: Diagnosis not present

## 2022-11-08 ENCOUNTER — Encounter: Payer: Self-pay | Admitting: Internal Medicine

## 2022-11-15 ENCOUNTER — Ambulatory Visit: Payer: Medicaid Other | Admitting: Family Medicine

## 2022-11-15 NOTE — Progress Notes (Deleted)
   Established Patient Office Visit  Subjective   Patient ID: Brittney Duran, female    DOB: 12-Mar-1970  Age: 52 y.o. MRN: [id number]  No chief complaint on file.   HPI  Mammogram-was this performed.?  Bipolar disorder-bupropion, lamotrigine, prazosin   The ASCVD Risk score (Arnett DK, et al., 2019) failed to calculate for the following reasons:   The patient has a prior MI or stroke diagnosis  Health Maintenance Due  Topic Date Due   COVID-19 Vaccine (1) Never done   DTaP/Tdap/Td (1 - Tdap) Never done   Zoster Vaccines- Shingrix (1 of 2) Never done   Cervical Cancer Screening (HPV/Pap Cotest)  01/01/2020   MAMMOGRAM  Never done   INFLUENZA VACCINE  Never done      Objective:     There were no vitals taken for this visit. {Vitals History (Optional):23777}  Physical Exam   No results found for any visits on 11/15/22.      Assessment & Plan:   There are no diagnoses linked to this encounter.   No follow-ups on file.    Sandre Kitty, MD

## 2022-11-17 DIAGNOSIS — N644 Mastodynia: Secondary | ICD-10-CM | POA: Diagnosis not present

## 2022-11-19 DIAGNOSIS — Z419 Encounter for procedure for purposes other than remedying health state, unspecified: Secondary | ICD-10-CM | POA: Diagnosis not present

## 2022-12-19 DIAGNOSIS — Z419 Encounter for procedure for purposes other than remedying health state, unspecified: Secondary | ICD-10-CM | POA: Diagnosis not present

## 2023-01-01 DIAGNOSIS — R42 Dizziness and giddiness: Secondary | ICD-10-CM | POA: Diagnosis not present

## 2023-01-01 DIAGNOSIS — R0981 Nasal congestion: Secondary | ICD-10-CM | POA: Diagnosis not present

## 2023-07-06 ENCOUNTER — Encounter (HOSPITAL_COMMUNITY): Payer: Self-pay | Admitting: Emergency Medicine

## 2023-07-06 ENCOUNTER — Ambulatory Visit (HOSPITAL_COMMUNITY)
Admission: EM | Admit: 2023-07-06 | Discharge: 2023-07-06 | Disposition: A | Discharge status: Home / Self Care | Attending: Family Medicine | Admitting: Family Medicine

## 2023-07-06 ENCOUNTER — Encounter: Payer: Self-pay | Admitting: Internal Medicine

## 2023-07-06 DIAGNOSIS — N3090 Cystitis, unspecified without hematuria: Secondary | ICD-10-CM | POA: Insufficient documentation

## 2023-07-06 DIAGNOSIS — R300 Dysuria: Secondary | ICD-10-CM | POA: Diagnosis present

## 2023-07-06 LAB — POCT URINALYSIS DIP (MANUAL ENTRY)
Bilirubin, UA: NEGATIVE
Glucose, UA: NEGATIVE mg/dL
Ketones, POC UA: NEGATIVE mg/dL
Nitrite, UA: NEGATIVE
Protein Ur, POC: 30 mg/dL — AB
Spec Grav, UA: >=1.03 — AB (ref 1.010–1.025)
Urobilinogen, UA: 1 U/dL
pH, UA: 6.5 (ref 5.0–8.0)

## 2023-07-06 MED ORDER — SULFAMETHOXAZOLE-TRIMETHOPRIM 800-160 MG PO TABS: 1.0000 | ORAL_TABLET | Freq: Two times a day (BID) | ORAL | 0 refills | Status: AC

## 2023-07-06 NOTE — Discharge Instructions (Signed)
 You have had labs (urine culture) sent today. We will call you with any significant abnormalities or if there is need to begin or change treatment or pursue further follow up.  You may also review your test results online through MyChart. If you do not have a MyChart account, instructions to sign up should be on your discharge paperwork.

## 2023-07-06 NOTE — ED Provider Notes (Signed)
 [facility]    ASSESSMENT & PLAN:  1. Dysuria   2. Cystitis    Begin: Meds ordered this encounter  Medications   sulfamethoxazole -trimethoprim  (BACTRIM  DS) 800-160 MG tablet    Sig: Take 1 tablet by mouth 2 (two) times daily for 5 days.    Dispense:  10 tablet    Refill:  0   No signs of pyelonephritis. Urine culture sent. Will notify patient of any significant results. Ensure proper hydration. Will follow up with her PCP or here if not showing improvement over the next 48 hours, sooner if needed.  Outlined signs and symptoms indicating need for more acute intervention. Patient verbalized understanding. After Visit Summary given.  SUBJECTIVE:  Brittney Duran is a 53 y.o. female who complains of 4 days cloudy urine; some dysuria.  Denies fever/chills/abd pain/n/v. H/O UTI: rarely. LMP: Patient's last menstrual period was 06/26/2023 (exact date).  OBJECTIVE:  Vitals:   07/06/23 1412  BP: 125/84  Pulse: 68  Resp: 17  Temp: 98.1 F (36.7 C)  TempSrc: Oral  SpO2: 97%   General appearance: alert; no distress Skin: warm and dry Psychological: alert and cooperative; normal mood and affect  Labs Reviewed  POCT URINALYSIS DIP (MANUAL ENTRY) - Abnormal; Notable for the following components:      Result Value   Clarity, UA cloudy (*)    Spec Grav, UA >=1.030 (*)    Blood, UA moderate (*)    Protein Ur, POC =30 (*)    Leukocytes, UA Large (3+) (*)    All other components within normal limits  URINE CULTURE    Allergies  Allergen Reactions   Lithium  Swelling    Face swelling  Face swelling   Other Swelling    lips   Penicillins Other (See Comments)    Unknown reaction Has patient had a PCN reaction causing immediate rash, facial/tongue/throat swelling, SOB or lightheadedness with hypotension: YES Has patient had a PCN reaction causing severe rash involving mucus membranes or skin necrosis: NO Has patient had a PCN reaction that required  hospitalizationNO Has patient had a PCN reaction occurring within the last 10 years: NO If all of the above answers are NO, then may proceed with Cephalosporin use.   Shellfish Allergy Swelling    lips   Latex Rash    Past Medical History:  Diagnosis Date   Anemia    Anxiety    Panic attacks   Complication of anesthesia    pt reports local wears off quickly   Depression    Environmental allergies    Family history of breast cancer    Family history of pancreatic cancer    Family history of prostate cancer    Family history of stomach cancer    Hemorrhoids    Leaky heart valve    Sciatica 09/08/2017   Stroke (HCC)    Wears contact lenses    Social History   Socioeconomic History   Marital status: Married    Spouse name: duglaus Elster   Number of children: 5   Years of education: 16   Highest education level: Some college, no degree  Occupational History   Occupation: [employer]  Tobacco Use   Smoking status: Former    Current packs/day: 0.00    Types: Cigarettes    Start date: 1982    Quit date: 1997    Years since quitting: 28.4   Smokeless tobacco: Never   Tobacco comments:    stopped at age 73  yro  Vaping Use   Vaping status: Never Used  Substance and Sexual Activity   Alcohol use: Yes    Alcohol/week: 0.0 standard drinks of alcohol    Comment: drinks red wine 2-3 times a month   Drug use: No   Sexual activity: Yes    Birth control/protection: None    Comment: Paragard  Other Topics Concern   Not on file  Social History Narrative   Lives at home w/ her husband and children; Left-handed; Drinks 1 cup of coffee per day. Lives in [address]; used to work for [employer]/ awaiting for disability [sec to stroke]; no smoking; rare alcohol/wine.    Social Drivers of Corporate investment banker Strain: Low Risk  (02/17/2023)   Received from [facility]   Overall Financial Resource Strain (CARDIA)    Difficulty of Paying Living Expenses: Not very hard  Food  Insecurity: No Food Insecurity (02/17/2023)   Received [facility]   Hunger Vital Sign    Within the past 12 months, you worried that your food would run out before you got the money to buy more.: Never true    Within the past 12 months, the food you bought just didn't last and you didn't have money to get more.: Never true  Transportation Needs: No Transportation Needs (02/17/2023)   Received from [facility] - Transportation    Lack of Transportation (Medical): No    Lack of Transportation (Non-Medical): No  Physical Activity: Insufficiently Active (02/17/2023)   Received from [facility]   Exercise Vital Sign    On average, how many days per week do you engage in moderate to strenuous exercise (like a brisk walk)?: 2 days    On average, how many minutes do you engage in exercise at this level?: 30 min  Stress: Stress Concern Present (02/17/2023)   Received from [facility] - Occupational Stress Questionnaire    Feeling of Stress : To some extent  Social Connections: Moderately Integrated (02/17/2023)   Received from [facility]   Social Connection and Isolation Panel    In a typical week, how many times do you talk on the phone with family, friends, or neighbors?: More than three times a week    How often do you get together with friends or relatives?: Three times a week    How often do you attend church or religious services?: More than 4 times per year    Do you belong to any clubs or organizations such as church groups, unions, fraternal or athletic groups, or school groups?: No    How often do you attend meetings of the clubs or organizations you belong to?: Never    Are you married, widowed, divorced, separated, never married, or living with a partner?: Married  Intimate Partner Violence: Not At Risk (02/17/2023)   Received from [facility]   Humiliation, Afraid, Rape, and Kick questionnaire    Within  the last year, have you been afraid of your partner or ex-partner?: No    Within the last year, have you been humiliated or emotionally abused in other ways by your partner or ex-partner?: No    Within the last year, have you been kicked, hit, slapped, or otherwise physically hurt by your partner or ex-partner?: No    Within the last year, have you been raped or forced to have any kind of sexual activity by  your partner or ex-partner?: No   Family History  Problem Relation Age of Onset   Hypertension Mother    Seizures Brother    Stomach cancer Maternal Aunt 34   Breast cancer Paternal Aunt        dx 25s   Pancreatic cancer Paternal Aunt 47   Prostate cancer Paternal Grandfather        dx 41s   Kidney failure Brother    Pancreatic cancer Other    Brain cancer Maternal Great-grandmother    Breast cancer Cousin        dx 19s        Afton Albright, MD 07/06/23 1756

## 2023-07-06 NOTE — ED Triage Notes (Signed)
 Pt reports 4 days had cloudy urine and having some dysuria.

## 2023-07-08 ENCOUNTER — Ambulatory Visit (HOSPITAL_COMMUNITY): Payer: Self-pay

## 2023-07-08 LAB — URINE CULTURE: Culture: >=100000 — AB

## 2023-07-19 ENCOUNTER — Encounter: Payer: Self-pay | Admitting: Internal Medicine

## 2023-07-26 ENCOUNTER — Encounter: Payer: Self-pay | Admitting: Internal Medicine

## 2023-08-05 ENCOUNTER — Encounter: Payer: Self-pay | Admitting: Internal Medicine
